# Patient Record
Sex: Male | Born: 1975 | Race: White | Hispanic: No | Marital: Married | Smoking: Never smoker
Health system: Southern US, Community
[De-identification: ages and names within clinical notes are randomized; demographics above are authoritative.]

## PROBLEM LIST (undated history)

## (undated) DIAGNOSIS — R112 Nausea with vomiting, unspecified: Secondary | ICD-10-CM

## (undated) DIAGNOSIS — K5790 Diverticulosis of intestine, part unspecified, without perforation or abscess without bleeding: Secondary | ICD-10-CM

## (undated) DIAGNOSIS — F419 Anxiety disorder, unspecified: Secondary | ICD-10-CM

## (undated) DIAGNOSIS — G039 Meningitis, unspecified: Secondary | ICD-10-CM

## (undated) DIAGNOSIS — Z9889 Other specified postprocedural states: Secondary | ICD-10-CM

## (undated) HISTORY — PX: APPENDECTOMY: SHX54

## (undated) HISTORY — PX: KNEE ARTHROSCOPY: SUR90

---

## 2003-09-25 ENCOUNTER — Ambulatory Visit (HOSPITAL_COMMUNITY): Admission: RE | Admit: 2003-09-25 | Discharge: 2003-09-25 | Payer: Self-pay | Admitting: Orthopedic Surgery

## 2003-10-18 ENCOUNTER — Observation Stay (HOSPITAL_COMMUNITY): Admission: RE | Admit: 2003-10-18 | Discharge: 2003-10-19 | Payer: Self-pay | Admitting: Orthopedic Surgery

## 2003-10-28 ENCOUNTER — Encounter (HOSPITAL_COMMUNITY): Admission: RE | Admit: 2003-10-28 | Discharge: 2003-11-27 | Payer: Self-pay | Admitting: Orthopedic Surgery

## 2003-11-02 HISTORY — PX: APPENDECTOMY: SHX54

## 2003-11-02 HISTORY — PX: KNEE ARTHROSCOPY: SUR90

## 2003-11-04 ENCOUNTER — Encounter (HOSPITAL_COMMUNITY): Admission: RE | Admit: 2003-11-04 | Discharge: 2003-12-04 | Payer: Self-pay | Admitting: Orthopedic Surgery

## 2003-11-28 ENCOUNTER — Inpatient Hospital Stay (HOSPITAL_COMMUNITY): Admission: EM | Admit: 2003-11-28 | Discharge: 2003-11-29 | Payer: Self-pay | Admitting: Emergency Medicine

## 2003-12-06 ENCOUNTER — Encounter (HOSPITAL_COMMUNITY): Admission: RE | Admit: 2003-12-06 | Discharge: 2004-01-05 | Payer: Self-pay | Admitting: Orthopedic Surgery

## 2003-12-24 ENCOUNTER — Ambulatory Visit (HOSPITAL_COMMUNITY): Admission: RE | Admit: 2003-12-24 | Discharge: 2003-12-24 | Payer: Self-pay | Admitting: Orthopedic Surgery

## 2004-01-06 ENCOUNTER — Encounter (HOSPITAL_COMMUNITY): Admission: RE | Admit: 2004-01-06 | Discharge: 2004-02-05 | Payer: Self-pay | Admitting: Orthopedic Surgery

## 2004-01-10 ENCOUNTER — Inpatient Hospital Stay (HOSPITAL_COMMUNITY): Admission: AD | Admit: 2004-01-10 | Discharge: 2004-01-15 | Payer: Self-pay | Admitting: Family Medicine

## 2004-02-05 ENCOUNTER — Encounter (HOSPITAL_COMMUNITY): Admission: RE | Admit: 2004-02-05 | Discharge: 2004-03-06 | Payer: Self-pay | Admitting: Orthopedic Surgery

## 2010-03-26 ENCOUNTER — Emergency Department (HOSPITAL_COMMUNITY): Admission: EM | Admit: 2010-03-26 | Discharge: 2010-03-27 | Payer: Self-pay | Admitting: Emergency Medicine

## 2010-03-27 ENCOUNTER — Inpatient Hospital Stay (HOSPITAL_COMMUNITY): Admission: EM | Admit: 2010-03-27 | Discharge: 2010-03-28 | Payer: Self-pay | Admitting: Emergency Medicine

## 2010-03-29 ENCOUNTER — Inpatient Hospital Stay (HOSPITAL_COMMUNITY)
Admission: EM | Admit: 2010-03-29 | Discharge: 2010-04-03 | Payer: Self-pay | Source: Home / Self Care | Admitting: Emergency Medicine

## 2010-04-02 ENCOUNTER — Ambulatory Visit: Payer: Self-pay | Admitting: Internal Medicine

## 2010-07-14 ENCOUNTER — Emergency Department (HOSPITAL_COMMUNITY): Admission: EM | Admit: 2010-07-14 | Discharge: 2010-07-14 | Payer: Self-pay | Admitting: Emergency Medicine

## 2010-10-08 ENCOUNTER — Emergency Department (HOSPITAL_COMMUNITY)
Admission: EM | Admit: 2010-10-08 | Discharge: 2010-10-08 | Payer: Self-pay | Source: Home / Self Care | Admitting: Emergency Medicine

## 2011-01-11 LAB — URINALYSIS, ROUTINE W REFLEX MICROSCOPIC
Bilirubin Urine: NEGATIVE
Glucose, UA: NEGATIVE mg/dL
Ketones, ur: 15 mg/dL — AB
Leukocytes, UA: NEGATIVE
Nitrite: NEGATIVE
Protein, ur: 30 mg/dL — AB
Specific Gravity, Urine: 1.025 (ref 1.005–1.030)
Urobilinogen, UA: 1 mg/dL (ref 0.0–1.0)
pH: 6 (ref 5.0–8.0)

## 2011-01-11 LAB — DIFFERENTIAL
Basophils Absolute: 0 10*3/uL (ref 0.0–0.1)
Basophils Relative: 0 % (ref 0–1)
Eosinophils Absolute: 0 10*3/uL (ref 0.0–0.7)
Eosinophils Relative: 0 % (ref 0–5)
Lymphocytes Relative: 11 % — ABNORMAL LOW (ref 12–46)
Lymphs Abs: 1.7 10*3/uL (ref 0.7–4.0)
Monocytes Absolute: 1.2 10*3/uL — ABNORMAL HIGH (ref 0.1–1.0)
Monocytes Relative: 8 % (ref 3–12)
Neutro Abs: 12.7 10*3/uL — ABNORMAL HIGH (ref 1.7–7.7)
Neutrophils Relative %: 81 % — ABNORMAL HIGH (ref 43–77)

## 2011-01-11 LAB — COMPREHENSIVE METABOLIC PANEL
ALT: 76 U/L — ABNORMAL HIGH (ref 0–53)
AST: 42 U/L — ABNORMAL HIGH (ref 0–37)
Albumin: 3.9 g/dL (ref 3.5–5.2)
Alkaline Phosphatase: 109 U/L (ref 39–117)
BUN: 12 mg/dL (ref 6–23)
CO2: 27 mEq/L (ref 19–32)
Calcium: 9.3 mg/dL (ref 8.4–10.5)
Chloride: 101 mEq/L (ref 96–112)
Creatinine, Ser: 0.98 mg/dL (ref 0.4–1.5)
GFR calc Af Amer: >60 mL/min (ref >60)
GFR calc non Af Amer: >60 mL/min (ref >60)
Glucose, Bld: 116 mg/dL — ABNORMAL HIGH (ref 70–99)
Potassium: 3.9 mEq/L (ref 3.5–5.1)
Sodium: 135 mEq/L (ref 135–145)
Total Bilirubin: 0.7 mg/dL (ref 0.3–1.2)
Total Protein: 7.5 g/dL (ref 6.0–8.3)

## 2011-01-11 LAB — CBC
HCT: 36.7 % — ABNORMAL LOW (ref 39.0–52.0)
Hemoglobin: 12.8 g/dL — ABNORMAL LOW (ref 13.0–17.0)
MCH: 29.3 pg (ref 26.0–34.0)
MCHC: 34.9 g/dL (ref 30.0–36.0)
MCV: 84 fL (ref 78.0–100.0)
Platelets: 361 10*3/uL (ref 150–400)
RBC: 4.37 MIL/uL (ref 4.22–5.81)
RDW: 12.6 % (ref 11.5–15.5)
WBC: 15.6 10*3/uL — ABNORMAL HIGH (ref 4.0–10.5)

## 2011-01-11 LAB — URINE CULTURE
Colony Count: NO GROWTH
Culture  Setup Time: 201112090130
Culture: NO GROWTH

## 2011-01-11 LAB — URINE MICROSCOPIC-ADD ON

## 2011-01-11 LAB — LIPASE, BLOOD: Lipase: 50 U/L (ref 11–59)

## 2011-01-14 LAB — BASIC METABOLIC PANEL
BUN: 12 mg/dL (ref 6–23)
CO2: 26 mEq/L (ref 19–32)
Calcium: 9.4 mg/dL (ref 8.4–10.5)
Chloride: 107 mEq/L (ref 96–112)
Creatinine, Ser: 0.91 mg/dL (ref 0.4–1.5)
GFR calc Af Amer: >60 mL/min (ref >60)
GFR calc non Af Amer: >60 mL/min (ref >60)
Glucose, Bld: 114 mg/dL — ABNORMAL HIGH (ref 70–99)
Potassium: 3.5 mEq/L (ref 3.5–5.1)
Sodium: 139 mEq/L (ref 135–145)

## 2011-01-14 LAB — TSH: TSH: 3.967 u[IU]/mL (ref 0.350–4.500)

## 2011-01-14 LAB — POCT CARDIAC MARKERS
CKMB, poc: <1 ng/mL — ABNORMAL LOW (ref 1.0–8.0)
Myoglobin, poc: 33.3 ng/mL (ref 12–200)
Troponin i, poc: <0.05 ng/mL (ref 0.00–0.09)

## 2011-01-18 LAB — COMPREHENSIVE METABOLIC PANEL
ALT: 13 U/L (ref 0–53)
AST: 12 U/L (ref 0–37)
Alkaline Phosphatase: 39 U/L (ref 39–117)
CO2: 25 mEq/L (ref 19–32)
Calcium: 8.9 mg/dL (ref 8.4–10.5)
GFR calc Af Amer: >60 mL/min (ref >60)
GFR calc non Af Amer: >60 mL/min (ref >60)
Glucose, Bld: 110 mg/dL — ABNORMAL HIGH (ref 70–99)
Potassium: 3.6 mEq/L (ref 3.5–5.1)
Sodium: 137 mEq/L (ref 135–145)

## 2011-01-18 LAB — CARDIAC PANEL(CRET KIN+CKTOT+MB+TROPI)
CK, MB: 0.3 ng/mL (ref 0.3–4.0)
Relative Index: INVALID (ref 0.0–2.5)
Total CK: 32 U/L (ref 7–232)
Troponin I: 0.01 ng/mL (ref 0.00–0.06)

## 2011-01-18 LAB — BASIC METABOLIC PANEL
BUN: 11 mg/dL (ref 6–23)
BUN: 12 mg/dL (ref 6–23)
BUN: 13 mg/dL (ref 6–23)
BUN: 14 mg/dL (ref 6–23)
CO2: 23 mEq/L (ref 19–32)
CO2: 24 mEq/L (ref 19–32)
CO2: 24 mEq/L (ref 19–32)
CO2: 27 mEq/L (ref 19–32)
CO2: 27 mEq/L (ref 19–32)
Calcium: 9.1 mg/dL (ref 8.4–10.5)
Calcium: 9.3 mg/dL (ref 8.4–10.5)
Calcium: 8.6 mg/dL (ref 8.4–10.5)
Calcium: 8.9 mg/dL (ref 8.4–10.5)
Chloride: 102 mEq/L (ref 96–112)
Chloride: 104 mEq/L (ref 96–112)
Chloride: 102 mEq/L (ref 96–112)
Chloride: 105 mEq/L (ref 96–112)
Creatinine, Ser: 0.84 mg/dL (ref 0.4–1.5)
Creatinine, Ser: 0.86 mg/dL (ref 0.4–1.5)
Creatinine, Ser: 0.98 mg/dL (ref 0.4–1.5)
GFR calc Af Amer: >60 mL/min (ref >60)
GFR calc Af Amer: >60 mL/min (ref >60)
GFR calc Af Amer: >60 mL/min (ref >60)
GFR calc Af Amer: >60 mL/min (ref >60)
GFR calc non Af Amer: >60 mL/min (ref >60)
GFR calc non Af Amer: >60 mL/min (ref >60)
GFR calc non Af Amer: >60 mL/min (ref >60)
Glucose, Bld: 92 mg/dL (ref 70–99)
Glucose, Bld: 96 mg/dL (ref 70–99)
Glucose, Bld: 104 mg/dL — ABNORMAL HIGH (ref 70–99)
Glucose, Bld: 114 mg/dL — ABNORMAL HIGH (ref 70–99)
Glucose, Bld: 98 mg/dL (ref 70–99)
Potassium: 3.4 mEq/L — ABNORMAL LOW (ref 3.5–5.1)
Potassium: 3.6 mEq/L (ref 3.5–5.1)
Potassium: 3.4 mEq/L — ABNORMAL LOW (ref 3.5–5.1)
Potassium: 3.6 mEq/L (ref 3.5–5.1)
Potassium: 4 mEq/L (ref 3.5–5.1)
Sodium: 134 mEq/L — ABNORMAL LOW (ref 135–145)
Sodium: 136 mEq/L (ref 135–145)
Sodium: 135 mEq/L (ref 135–145)

## 2011-01-18 LAB — GRAM STAIN

## 2011-01-18 LAB — CSF CULTURE W GRAM STAIN: Culture: NO GROWTH

## 2011-01-18 LAB — DIFFERENTIAL
Basophils Absolute: 0.1 10*3/uL (ref 0.0–0.1)
Basophils Absolute: 0.1 10*3/uL (ref 0.0–0.1)
Basophils Relative: 1 % (ref 0–1)
Basophils Relative: 0 % (ref 0–1)
Basophils Relative: 1 % (ref 0–1)
Eosinophils Absolute: 0 10*3/uL (ref 0.0–0.7)
Eosinophils Absolute: 0 10*3/uL (ref 0.0–0.7)
Eosinophils Absolute: 0 10*3/uL (ref 0.0–0.7)
Eosinophils Absolute: 0 10*3/uL (ref 0.0–0.7)
Eosinophils Relative: 1 % (ref 0–5)
Eosinophils Relative: 0 % (ref 0–5)
Eosinophils Relative: 0 % (ref 0–5)
Eosinophils Relative: 0 % (ref 0–5)
Lymphocytes Relative: 20 % (ref 12–46)
Lymphocytes Relative: 13 % (ref 12–46)
Lymphocytes Relative: 26 % (ref 12–46)
Lymphs Abs: 1.5 10*3/uL (ref 0.7–4.0)
Lymphs Abs: 1 10*3/uL (ref 0.7–4.0)
Lymphs Abs: 1.6 10*3/uL (ref 0.7–4.0)
Lymphs Abs: 1.9 10*3/uL (ref 0.7–4.0)
Monocytes Absolute: 0.7 10*3/uL (ref 0.1–1.0)
Monocytes Absolute: 0.4 10*3/uL (ref 0.1–1.0)
Monocytes Absolute: 0.6 10*3/uL (ref 0.1–1.0)
Monocytes Absolute: 0.9 10*3/uL (ref 0.1–1.0)
Monocytes Relative: 9 % (ref 3–12)
Monocytes Relative: 10 % (ref 3–12)
Monocytes Relative: 10 % (ref 3–12)
Monocytes Relative: 6 % (ref 3–12)
Neutro Abs: 5.4 10*3/uL (ref 1.7–7.7)
Neutro Abs: 3.9 10*3/uL (ref 1.7–7.7)
Neutro Abs: 5.9 10*3/uL (ref 1.7–7.7)
Neutrophils Relative %: 70 % (ref 43–77)

## 2011-01-18 LAB — CSF CELL COUNT WITH DIFFERENTIAL
Eosinophils, CSF: 0 % (ref 0–1)
Eosinophils, CSF: 0 % (ref 0–1)
Monocyte-Macrophage-Spinal Fluid: 0 % — ABNORMAL LOW (ref 15–45)
Monocyte-Macrophage-Spinal Fluid: 0 % — ABNORMAL LOW (ref 15–45)
Other Cells, CSF: 0
Tube #: 1
Tube #: 3
WBC, CSF: 700 /mm3 (ref 0–5)

## 2011-01-18 LAB — HERPES SIMPLEX VIRUS(HSV) DNA BY PCR: HSV 1 DNA: NOT DETECTED

## 2011-01-18 LAB — HEPATIC FUNCTION PANEL
ALT: 14 U/L (ref 0–53)
AST: 13 U/L (ref 0–37)
Albumin: 4.2 g/dL (ref 3.5–5.2)
Alkaline Phosphatase: 45 U/L (ref 39–117)
Total Protein: 6.6 g/dL (ref 6.0–8.3)

## 2011-01-18 LAB — CBC
HCT: 38.3 % — ABNORMAL LOW (ref 39.0–52.0)
HCT: 33.3 % — ABNORMAL LOW (ref 39.0–52.0)
HCT: 35.8 % — ABNORMAL LOW (ref 39.0–52.0)
HCT: 39.7 % (ref 39.0–52.0)
HCT: 40.3 % (ref 39.0–52.0)
Hemoglobin: 13.3 g/dL (ref 13.0–17.0)
Hemoglobin: 11.5 g/dL — ABNORMAL LOW (ref 13.0–17.0)
Hemoglobin: 12.4 g/dL — ABNORMAL LOW (ref 13.0–17.0)
Hemoglobin: 12.4 g/dL — ABNORMAL LOW (ref 13.0–17.0)
Hemoglobin: 13.8 g/dL (ref 13.0–17.0)
MCHC: 34.8 g/dL (ref 30.0–36.0)
MCHC: 34.1 g/dL (ref 30.0–36.0)
MCHC: 34.4 g/dL (ref 30.0–36.0)
MCHC: 34.5 g/dL (ref 30.0–36.0)
MCV: 84.7 fL (ref 78.0–100.0)
MCV: 85.7 fL (ref 78.0–100.0)
MCV: 86.2 fL (ref 78.0–100.0)
Platelets: 198 10*3/uL (ref 150–400)
Platelets: 210 10*3/uL (ref 150–400)
Platelets: 211 10*3/uL (ref 150–400)
RBC: 4.52 MIL/uL (ref 4.22–5.81)
RBC: 3.89 MIL/uL — ABNORMAL LOW (ref 4.22–5.81)
RBC: 4.16 MIL/uL — ABNORMAL LOW (ref 4.22–5.81)
RBC: 4.25 MIL/uL (ref 4.22–5.81)
RDW: 12.8 % (ref 11.5–15.5)
RDW: 12.8 % (ref 11.5–15.5)
WBC: 7.7 10*3/uL (ref 4.0–10.5)
WBC: 7.1 10*3/uL (ref 4.0–10.5)
WBC: 7.7 10*3/uL (ref 4.0–10.5)

## 2011-01-18 LAB — URINALYSIS, ROUTINE W REFLEX MICROSCOPIC
Glucose, UA: NEGATIVE mg/dL
Ketones, ur: >80 mg/dL — AB
Leukocytes, UA: NEGATIVE
Nitrite: NEGATIVE
Protein, ur: 30 mg/dL — AB

## 2011-01-18 LAB — MAGNESIUM: Magnesium: 2.3 mg/dL (ref 1.5–2.5)

## 2011-01-18 LAB — PROTEIN AND GLUCOSE, CSF
Glucose, CSF: 53 mg/dL (ref 43–76)
Total  Protein, CSF: 120 mg/dL — ABNORMAL HIGH (ref 15–45)

## 2011-01-18 LAB — AMYLASE: Amylase: 39 U/L (ref 0–105)

## 2011-01-18 LAB — URINE MICROSCOPIC-ADD ON

## 2011-01-18 LAB — VDRL, CSF: VDRL Quant, CSF: NONREACTIVE

## 2011-03-19 NOTE — Op Note (Signed)
NAME:  Carlos Stone, Carlos Stone                         ACCOUNT NO.:  [id number]   MEDICAL RECORD NO.:  [id number]                   PATIENT TYPE:  AMB   LOCATION:  DAY                                  FACILITY:  [facility]   PHYSICIAN:  Vickki Hearing, M.D.           DATE OF BIRTH:  01-09-1976   DATE OF PROCEDURE:  12/24/2003  DATE OF DISCHARGE:                                 OPERATIVE REPORT   HISTORY AND INDICATIONS:  Carlos Stone is 35 years old.  He is status post ACL  reconstruction with allograft and an autograft, osteochondral procedure in  December 2004.  Developed arthrofibrosis.  After eight weeks of no progress  in therapy, and a range of motion of 5 to 80 and scarring of the patella, he  presented for arthroscopy, examination under anesthesia, manipulation under  anesthesia of his right knee.   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   OPERATION/PROCEDURE:  Arthroscopy with extensive debridement and  manipulation under anesthesia as well as examination under anesthesia.   SURGEON:  Vickki Hearing, M.D.   ANESTHESIA:  General.   FINDINGS:  Preoperative range of motion under anesthesia 5-80 degrees,  postoperative 0-125.  Intraoperative graft was noted to be intact by  visualization and probing.  His Lachman's was grade 0.  There was extensive  scarring and fibrotic tissue in the suprapatellar pouch and medial gutter.  There was scarring of the fat pad.   DESCRIPTION OF PROCEDURE:  Carlos Stone was identified in the holding area,  marked his knee on the right with an X.  I signed my initials there,  reviewed his medical records and noted that the chart history and consent  both confirmed right knee was for surgery with arthroscopy.  He was given  Ancef.  He was taken to the operating room where  general anesthetic was  administered.  He has had a sterile prep and drape of the right knee.  At  that point we took a time out, confirmed the patient's identify, his consent  and procedure and extremity.  Everyone agreed that he was to have  arthroscopy of the right knee, manipulation and examination under anesthesia  for arthrofibrosis.   A standard three-incision arthroscopy was established with the suprapatellar  cannula.  We manually reestablished the suprapatellar pouch.  We did the  same thing in the medial and lateral gutters, inserted the scope into the  suprapatellar pouch and did a debridement with a combination of an  ArthroCare wand and a full radius shaver blade.  We proceeded down the  medial gutter, anterior fat pad and the lateral side which seemed to be  clean.  There was no impingement in the notch.  The graft was intact.  The  osteochondral graft was also probed and found to  be intact.  After we  finished our debridement, we did a manual manipulation until his knee  reached 125 degrees and full extension.  We then put the scope back in the  knee, rechecked the graft, found it to be intact, took care of any bleeding,  then irrigated the knee, suctioned it free, and closed with 3-0 nylon  sutures, injected 30 mL of 0.5% Sensorcaine plain, covered the knee with  sterile dressings and a cryopad.  He was then extubated and taken to the  recovery room in stable condition where he was placed on a CPM machine.   POSTOPERATIVE PLAN:  Immediate physical therapy starting tomorrow, continued  daily for the first two weeks, and then we will try to get a CPM for rental  for him as well.  Followup in two days.      ___________________________________________                                            Vickki Hearing, M.D.   SEH/MEDQ  D:  12/24/2003  T:  12/24/2003  Job:  [id number]

## 2011-03-19 NOTE — Op Note (Signed)
NAME:  Carlos Stone, Carlos Stone                         ACCOUNT NO.:  [id number]   MEDICAL RECORD NO.:  [id number]                   PATIENT TYPE:  AMB   LOCATION:  DAY                                  FACILITY:  [facility]   PHYSICIAN:  Vickki Hearing, M.D.           DATE OF BIRTH:  1976-07-02   DATE OF PROCEDURE:  09/25/2003  DATE OF DISCHARGE:                                 OPERATIVE REPORT   PREOPERATIVE DIAGNOSIS:  Loose body right knee.   POSTOPERATIVE DIAGNOSES:  1. Partial anterior cruciate ligament (ACL) tear right knee.  2. Osteochondritis dissecans (OCD) medial femoral condyle.  3. Possible/questionable old lateral meniscal tear.   PROCEDURE:  1. Arthroscopic drilling medial femoral condyle.  2. Examination under anesthesia.   SURGEON:  Vickki Hearing, M.D.   ANESTHETIC:  LMA general.   FINDINGS:  OCD medial femoral condyle, lateral portion.  There was no loose  body found.  There was a partial tear of the ACL.  There was a questionable  old lateral meniscal tear with scarring of the peripheral attachment side  of the posterior horn.  Under anesthesia there was a grade 1 Lachman and a  glide on pivot.   HISTORY:  The patient is 35 years old.  He had an injury in [employer],  mechanism is unknown at this time (the exact details).  He did have to  undergo therapy then had a loose body removed approximately 14 years ago.  Presented, at this time, with a locked knee with an MRI showing the  possibility of a loose body.  Reviewing the MRI the ligaments were thought to be intact.   DETAILS OF PROCEDURE:  The patient was identified as Carlos Stone.  His  right knee was marked as the operative site.  Consent and chart agreed with  his marking of the right knee as the surgical site.  He was given  preoperative antibiotics, and taken to the operating room where he had an  LMA anesthetic.   We did a time out, confirmed the procedure, physical therapy and limb; and  proceed  prep and drape the right lower extremity. We did a 3-incision,  arthroscopic diagnostic procedure.   The findings I have listed above.   The lesion was debrided of free soft tissue.  The underlying bed was  sclerotic bone.  Chondral pick was used to make several holes in the  subchondral plate.   The knee was inspected in the posteromedial and posterolateral portions as  well as the gutters and suprapatellar pouch for loose body.  There was none  found.   We washed the knee out, suctioned it dry, and then did a examination under  anesthesia with the lateral post removed; found out that he had a grade 1  Lachman under anesthesia and a pivot glide on the pivot shift test.   We then dressed the knee with sterile dressings, cryo pad and  ACE bandage.  We extubated him and took him to the recovery room in stable condition.   POSTOPERATIVE PLAN:  The patient will require further surgery.  He will  require allograft or autograft chondral grafting of the medial femoral  condyle, ACL reconstruction and probably open repair of his lateral  meniscus.      ___________________________________________                                            Vickki Hearing, M.D.   SEH/MEDQ  D:  09/25/2003  T:  09/25/2003  Job:  [id number]

## 2011-03-19 NOTE — Op Note (Signed)
NAME:  Carlos Stone, Carlos Stone                         ACCOUNT NO.:  [id number]   MEDICAL RECORD NO.:  [id number]                   PATIENT TYPE:  OBV   LOCATION:  A313                                 FACILITY:  [facility]   PHYSICIAN:  Barbaraann Barthel, M.D.              DATE OF BIRTH:  03-20-1976   DATE OF PROCEDURE:  11/27/2003  DATE OF DISCHARGE:                                 OPERATIVE REPORT   PREOPERATIVE DIAGNOSIS:  Acute appendicitis.   POSTOPERATIVE DIAGNOSIS:  Acute appendicitis.   SURGEON:  Barbaraann Barthel, M.D.   PROCEDURE:  Open appendectomy.   SPECIMENS:  Appendix.   Note, this is a 35 year old white male who had a 36 hour history of right  lower quadrant pain, nausea and anorexia.  He came to the emergency room  with a positive CT scan and we plan for surgery after hydration and  antibiotic therapy was initiated.  We discussed complications not limited to  but including bleeding, infection, and appendiceal stump leak.  Informed  consent was obtained.   GROSS FINDINGS:  Those consistent with an acute __________ nonperforated  appendicitis. The appendix was located retrocecally.   TECHNIQUE:  The patient was placed in supine position and after the adequate  administration of general anesthesia via endotracheal intubation, his entire  abdomen was prepped with Betadine solution and draped in the usual manner.  Prior to this, a Foley catheter was inserted.  A transverse incision was  made in the right lower quadrant through skin and subcutaneous tissue,  through the fascia and through the peritoneum retracting the rectus  medially.  The cecum was delivered into the wound, the appendix was found to  be very retrocecal. We amputated it at its insertion site at the cecum with  the endoscopic GIA device. We then ligated the mesoappendix with 2-0 silk  and then carefully dissected down freeing up the cecum from the right  peritoneal wall in order to deliver the appendix.  After  ligating the  mesoappendix, we then changed gloves, irrigated the right lower quadrant and  then closed the abdomen according to layers using #0 Polysorb to close the  peritoneum and figure-of-eight interrupted Polysorb's for the fascia.  The  subcu was irrigated and the skin was approximated with a stapling device.  Prior to closure, all sponge, needle and instrument counts were found to be  correct. Estimated blood loss was minimal. The patient received a liter of  crystalloid intraoperatively. There was no drains placed, there were no  complications.      ___________________________________________                                            Barbaraann Barthel, M.D.   WB/MEDQ  D:  11/27/2003  T:  11/27/2003  Job:  [id number]

## 2011-03-19 NOTE — Discharge Summary (Signed)
NAME:  Carlos Stone, Carlos Stone                         ACCOUNT NO.:  [id number]   MEDICAL RECORD NO.:  [id number]                   PATIENT TYPE:  INP   LOCATION:  A312                                 FACILITY:  [facility]   PHYSICIAN:  Patrica Duel, M.D.                 DATE OF BIRTH:  01-May-1976   DATE OF ADMISSION:  01/09/2004  DATE OF DISCHARGE:  01/15/2004                                 DISCHARGE SUMMARY   DISCHARGE DIAGNOSES:  1. Recurrent/ refractory Clostridium difficile colitis.  2. Status post recent arthroscopic knee procedure.  3. Status post appendectomy in February of this year.   For details regarding admission, please refer to the admission note.   HISTORY OF PRESENT ILLNESS:  This is a 35 year old male with a history of  Clostridium difficile colitis who appropriately one month ago was treated  with Flagyl successfully.  The patient had recently undergone emergency  appendectomy for acute appendicitis.  He had been treated with Rocephin  apparently in the perioperative period.  He responded promptly and  completely to Flagyl as prescribed.   The patient underwent arthroscopic knee procedure on December 24, 2003.  He  soon developed diarrhea, and took another course of Flagyl with resolution.  He presented to the office on the day of admission with a 12-hour history of  increasingly severe nausea, vomiting and diarrhea.  He had a temperature of  100 degrees in the office.  He was given an empiric course of Flagyl and  appropriate diagnostics were obtained.   Upon arriving home, his mother felt he was too sick, and he was admitted for  hydration and further evaluation and therapy as indicated.   HOSPITAL COURSE:  The patient's stool tested positive for Clostridium  difficile toxin.  His antibiotic regimen was changed to vancomycin.  GI was  consulted who agreed with our management.  Lactinex was added.  The  patient's clinical status and laboratory parameters improved  gradually over  the course of his hospitalization.  He had continuing diarrhea and a  fleeting rash which promptly resolved. As of yesterday, his diarrhea has  resolved.  He is ambulating well, taking a good diet, and has had no fever  or other problems.  He is stable for discharge at this time.   DISPOSITION:  As per GI.   DISCHARGE MEDICATIONS:  1. Vancomycin 250 mg per 5 cc, 2.5 cc q.i.d. x7 days, and then 2.5 cc b.i.d.     x7 days.  2. He will continue Lactinex and p.r.n. Lomotil and use dietary discretion.  3. He will be followed expectantly as an outpatient.    ___________________________________________                                         Patrica Duel, M.D.   MC/MEDQ  D:  01/15/2004  T:  01/16/2004  Job:  [id number]

## 2011-03-19 NOTE — Group Therapy Note (Signed)
NAME:  Carlos Stone, Carlos Stone                         ACCOUNT NO.:  [id number]   MEDICAL RECORD NO.:  [id number]                   PATIENT TYPE:  AMB   LOCATION:  DAY                                  FACILITY:  [facility]   PHYSICIAN:  Vickki Hearing, M.D.           DATE OF BIRTH:  1976-07-28   DATE OF PROCEDURE:  DATE OF DISCHARGE:                                   PROGRESS NOTE   ADDENDUM:  The patient was scheduled to go home.  He is too nauseous and did  not respond well to the Zofran and will, therefore, be admitted.  We will  continue monitoring him, give him fluid and more Zofran to see if this will  control his nausea, and then he can go home tomorrow morning.      ___________________________________________                                            Vickki Hearing, M.D.   SEH/MEDQ  D:  10/18/2003  T:  10/18/2003  Job:  [id number]

## 2011-03-19 NOTE — H&P (Signed)
NAME:  Carlos Stone, Carlos Stone                         ACCOUNT NO.:  [id number]   MEDICAL RECORD NO.:  [id number]                   PATIENT TYPE:  AMB   LOCATION:  DAY                                  FACILITY:  [facility]   PHYSICIAN:  Vickki Hearing, M.D.           DATE OF BIRTH:  1975/11/06   DATE OF ADMISSION:  DATE OF DISCHARGE:                                HISTORY & PHYSICAL   CHIEF COMPLAINT:  Stiff right knee.   HISTORY:  A 35 year old male status post ACL reconstruction with allograft  and an autograft osteochondral graft who had surgery in December 2004, did  not progress well in rehab. We gave him several rehabilitation treatments,  did not progress in terms of range of motion, and now presents for  manipulation under anesthesia and debridement of scar tissue.   He has recently had an appendectomy. He is allergic to PENICILLIN. No other  medical problems. In 1990, he had knee surgery. In 1995, he had a knee  surgery ago. Had a tonsillectomy in 1987. He had an arthroscopy of his knee  which I did in November of 2004 at which time I found his deficit ACL and  his osteochondral injury.   MEDICATIONS:  Currently on Lorcet Plus.   FAMILY HISTORY:  Negative.   PHYSICIANS:  Dr. Nobie Putnam and Dr. Phillips Odor.   SOCIAL HISTORY:  He is married. He works in a Doctor, hospital. He does not  smoke or drink. He completed his education through the 12th grade.   PHYSICAL EXAMINATION:  GENERAL:  He is well developed, well nourished;  grooming and hygiene are normal. No deformity except for the right knee. He  has medium frame, medium height. His pulse is 74. His respiratory rate is  20.  CARDIOVASCULAR:  Showed normal pulse and perfusion. No edema, tenderness, or  swelling.  LYMPHATIC SYSTEM:  Has no cervical adenopathy.  SKIN:  His skin is normal. He has incisions on the medial and lateral  aspects of his knee from his ACL reconstruction.  NEUROLOGICAL:  His neurological exam shows normal  sensation and reflexes. He  is alert and oriented x3. His mood and affect is normal.  EXTREMITIES:  His right knee has range of motion of about 10 to 85. I can  press him to about 90. He does not go beyond that. His graft appears stable.  He has medial and lateral patellar contracture with decreased mobility.   IMPRESSION:  Arthrofibrosis, right knee, status post ACL reconstruction, and  autograft chondral graft.   PLAN:  Arthroscopic evaluation, exam and manipulation under anesthesia of  the right knee.     ___________________________________________                                         Vickki Hearing, M.D.   SEH/MEDQ  D:  12/23/2003  T:  12/23/2003  Job:  [id number]

## 2011-03-19 NOTE — Op Note (Signed)
NAME:  MAIKA, KACZMAREK                         ACCOUNT NO.:  [id number]   MEDICAL RECORD NO.:  [id number]                   PATIENT TYPE:  OBV   LOCATION:  A307                                 FACILITY:  [facility]   PHYSICIAN:  Vickki Hearing, M.D.           DATE OF BIRTH:  01-25-1976   DATE OF PROCEDURE:  DATE OF DISCHARGE:                                 OPERATIVE REPORT   PREOPERATIVE DIAGNOSES:  Anterior cruciate ligament tear right knee, OC&D  lesion medial femoral condyle, lateral meniscal tear all right knee.   POSTOPERATIVE DIAGNOSES:  Anterior cruciate ligament tear right knee, OC&D  lesion medial femoral condyle, lateral meniscal tear all right knee, loose  body.   PROCEDURE:  Allograft ACL reconstruction using a frozen Achilles tendon  allograft, autograft lateral trochlea to medial femoral condyle using an  OATS system, removal of loose body and repair of lateral meniscal tear using  meniscal arrow.   SURGEON:  Vickki Hearing, M.D.   ANESTHESIA:  Spinal.   FINDINGS:  The ACL was torn as noted on previous arthroscopic evaluation.  The OCD lesion of the lateral portion of the medial femoral condyle had  scarred in with fibrous and hemorrhagic clot.  There was a loose body behind  the capsule once the ACL was taken down and the over the top position was  identified. There was an old lateral meniscal tear with capsular stretching.   DESCRIPTION OF PROCEDURE:  Mr. Coll was identified by armband in the  holding area.  After reviewing his medical record and his consent and the  mark he had placed over his right knee, I placed my initials over the right  knee.  He was given Ancef and taken to the operating suite where he was  given general anesthesia.  At that time, he had an exam under anesthesia and  we took a time out.  We confirmed that this was indeed Carlos Stone, that  his right knee was the operative site and that he was to have an ACL  allograft and OCD  autograft and arthroscopic open combination procedure of  the right knee.   Sterile prep and drape was performed, exam under anesthesia was performed.  Finding loose knee with positive Lachman and pivot shift test.   We first prepared the graft on the back table preparing a 10 mm calcaneal  bone plug for the femoral side of the graft.  We placed in on a tensioner,  wrapped it in saline and proceeded with the arthroscopic portion of the  procedure.   A three incision arthroscopic technique was utilized. Diagnostic arthroscopy  was performed. The lateral meniscus was found to be intact at its periphery  with stretching of the capsulomeniscal junction.  We placed two meniscal  arrows in this meniscus and that stabilized it very well.   We then debrided the old ACL stump, found the over the top position, drilled  the tibial pin in the ACL foot print and passed an over the top 7 mm guide,  passed a guidewire across the joint into the femur and out the anterolateral  femur.  I took an 11 mm reamer for the tibial tunnel and a 10 mm reamer for  the femoral tunnel, cleaned the debris out from the joint, rasped the  posterior tibial tunnel and then pulled the graft through.  I made a notch  over the anterolateral portion of the femoral bone plug, passed a dilator  guide pin and a 7 x 23 mm bioscrew.  I then pulled on the distal portion of  the graft to ensure fixation.  I was satisfied and proceeded to the grafting  portion for the OCD lesion.   I first debrided the old lesion, debrided the rim of the lesion removing the  clot and fibrous scar.  I made a lateral incision over the lateral trochlea  and femoral condyle, went through the capsule and took a  15 mm bone plug using the OATS harvester.  We removed this and then did a  medial arthrotomy, prepared the donor site and then placed the graft in  place.  I took the donor site and packed it in the harvest site over the  lateral trochlea.    Irrigated the knee out with copious amounts of saline, closed the capsule  with Bralon, subcu with 0 and 2-0 Vicryl, closed the skin with skin staples.  I twisted the graft 90 degrees laterally, viewed it arthroscopically  including the bone graft. The ACL graft looked good and then the bone graft  plug osteochondral graft looked good.   In extension, I passed an 11 x 35 mm screw, checked the intraarticular view  to make sure the screw is not in the joint, it was not and then checked  Lachman test and the knee was solid as a rock.  We injected 30 mL of  Sensorcaine.   We put sterile dressings in a CryoCuff along with Ace bandage on the knee,  placed them in a knee immobilizer.   He was extubated and taken to the recovery room in stable condition.   He can go home today.  Followup Monday.      ___________________________________________                                            Vickki Hearing, M.D.   SEH/MEDQ  D:  10/18/2003  T:  10/19/2003  Job:  [id number]

## 2011-03-19 NOTE — Consult Note (Signed)
NAME:  Carlos Stone, [name]                         ACCOUNT NO.:  [id number]   MEDICAL RECORD NO.:  [id number]                   PATIENT TYPE:  OBV   LOCATION:  A313                                 FACILITY:  [facility]   PHYSICIAN:  Barbaraann Barthel, M.D.              DATE OF BIRTH:  08/26/1976   DATE OF CONSULTATION:  11/27/2003  DATE OF DISCHARGE:                                   CONSULTATION   ADMITTING HISTORY AND PHYSICAL AND EMERGENCY ROOM NOTE   NOTE:  Surgery was asked to see this 35 year old white male who came into  the emergency room with an approximately 36-hour history of right lower  quadrant pain accompanied with nausea and anorexia.  He has not eaten since  6 o'clock yesterday evening and he was seen in the emergency room where CT  scan revealed acute retrocecal appendicitis.  Surgery was consulted and  responded immediately.   PHYSICAL EXAMINATION:  GENERAL:  Physical examination discloses a pleasant,  35 year old white male in no acute distress, but uncomfortable.  He is 175  pounds. He is 5 feet 9 inches.  His temperature is 99.3, his pulse is 74,  respirations 20/min and his blood pressure is 122/72.  HEENT:  Head is normocephalic.  Eyes extraocular movements are intact.  Pupils are round and react to light and accommodation.  There is no  conjunctival pallor or scleral injection.  The sclerae are of normal  tincture.  NECK:  There are no bruits auscultated and there is no adenopathy.  CHEST:  Chest is clear both to anterior and posterior auscultation.  HEART:  Regular rhythm.  ABDOMEN:  The patient is tender in the right lower quadrant with localized  rebound tenderness.  He has no femoral or inguinal hernias appreciated.  GENITALIA:  Normal.  RECTAL:  Prostate is smooth and stool is guaiac negative.  EXTREMITIES:  He is status post ACS repair with bone graft on the right knee  done in December 2004.   REVIEW OF SYSTEMS:  GI SYSTEM:  No past history of hepatitis, no  past  history of bright red rectal bleeding, black tarry stools, or change in  bowel habits.  He has nausea and anorexia and right lower quadrant pain.  GU  SYSTEM:  No history of nephrolithiasis or dysuria.  ENDOCRINE SYSTEM:  No  history of diabetes of diabetes or thyroid disease.  CARDIORESPIRATORY  SYSTEM:  Within normal limits. The patient is a nonsmoker and nondrinker.  MUSCULOSKELETAL SYSTEM:  Status post right knee surgery.   MEDICATIONS:  The patient takes ibuprofen p.r.n. and he is allergic to  penicillin.   PAST SURGERY:  Includes a T&A during childhood and right knee surgery in  December 2004.   LABORATORY DATA:  The patient has a white count of 11,000 with an H&H of  13.1 and 38.9 with 85 neutrophils appreciated.  Metabolic 7 was grossly  within normal limits as  is the liver function studies. Urinalysis is grossly  within normal limits.  CT scan revealed retrocecal appendicitis.   IMPRESSION:  1. Acute appendicitis.  2. Status post right knee surgery.   PLAN:  Admit for hydration, antibiotic therapy, and plan for appendectomy as  soon as possible.      ___________________________________________                                            Barbaraann Barthel, M.D.   WB/MEDQ  D:  11/27/2003  T:  11/27/2003  Job:  [id number]   cc:   Barbaraann Barthel, M.D.  [address]  [place]  [place] [address]  Fax: [phone]   Rhae Lerner. Margretta Ditty, M.D.  [address]  [place]  [place] [address]

## 2011-03-19 NOTE — Discharge Summary (Signed)
NAME:  [name], [name]                         ACCOUNT NO.:  [id number]   MEDICAL RECORD NO.:  [id number]                   PATIENT TYPE:  OBV   LOCATION:  A307                                 FACILITY:  [facility]   PHYSICIAN:  Vickki Hearing, M.D.           DATE OF BIRTH:  04-23-1976   DATE OF ADMISSION:  10/18/2003  DATE OF DISCHARGE:                                 DISCHARGE SUMMARY   ADMISSION DIAGNOSES:  1. Right knee anterior cruciate ligament tear.  2. 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  3. Chronic lateral meniscal tear.   POSTOPERATIVE DIAGNOSES:  1. Right knee anterior cruciate ligament tear.  2. 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  3. Right knee lateral meniscal chronic tear.  4. Right knee loose body.   HISTORY:  Carlos Stone is a 35 year old male who had several injuries to his right  knee.  Most recently felt he had a loose body in the knee, and we did an  arthroscopy.  Although we did not find a loose body, we found a large medial  femoral condyle defect and performed a drilling arthroplasty of the defect  along with an exam under anesthesia and diagnostic arthroscopy, which  revealed an ACL tear.  There was also laxity of the capsule, ligamentous  structures of the lateral meniscus.   Once he was in the office we discussed further treatment for his knee and  decided to use an ACL allograft and an autograft osteochondral repair of his  medial femoral condyle defect, re-examining the lateral meniscus repair if  necessary.   The patient was admitted on October 18, 2003, underwent uncomplicated  surgery.  Had an ACL allograft with Achilles tendon.  Had an autograft OCD  of the medial femoral condyle with a bone plug and cartilage attached.  Had  his meniscus repaired with two meniscal arrows, and the loose body was  found, and it was removed.   Postoperatively he became nauseous, did not respond initially to Zofran and  was admitted for observation.   On October 19, 2003, he was awake, alert, and oriented x3.  He seems to be  comfortable.  He is neurovascularly intact.  He has been up and ambulating  to the bathroom and is stable for discharge.   DISCHARGE CONDITION:  Improved.   DISPOSITION:  To home.   INSTRUCTIONS:  CryoCuff eight hours a day.  Nonweightbearing on his right  lower extremity.  Brace and crutches.   He should be on:  1. Norflex 100 mg b.i.d.  2. Ibuprofen 800 mg three times daily.  3. Lorcet Plus 1 q.4h. p.r.n.   His follow-up is scheduled for Monday, October 21, 2003.     ___________________________________________  Vickki Hearing, M.D.   SEH/MEDQ  D:  10/19/2003  T:  10/19/2003  Job:  [id number]

## 2011-03-19 NOTE — H&P (Signed)
NAME:  Carlos Stone, Carlos Stone                         ACCOUNT NO.:  [id number]   MEDICAL RECORD NO.:  [id number]                   PATIENT TYPE:  OBV   LOCATION:  A312                                 FACILITY:  [facility]   PHYSICIAN:  Patrica Duel, M.D.                 DATE OF BIRTH:  [birth year]   DATE OF ADMISSION:  01/09/2004  DATE OF DISCHARGE:                                HISTORY & PHYSICAL   CHIEF COMPLAINT:  Nausea, vomiting, and diarrhea.   HISTORY OF PRESENT ILLNESS:  This is a 35 year old male with history of  Clostridium difficile colitis diagnosed approximately 1 month ago.  This  occurred soon after having undergone emergent appendectomy for acute  appendicitis.  During his hospitalization he had received some antibiotics.  He responded promptly and completely to Flagyl 500 t.i.d.   The patient underwent an arthroscopic knee procedure on December 24, 2003.  The patient developed diarrhea soon after surgery and took another course of  Flagyl with resolution.  The patient presented to the office today with a 12-  hour history of nausea, vomiting, and diarrhea.  The patient denies any  melena, hematemesis, or hematochezia.  He has had some chills and  temperature was documented at 100 degrees in the office.  Hospitalization  was suggested but refused.  A CBC was drawn.  He was given Phenergan,  Lomotil, and a course of Flagyl empirically.  Upon arrival at his home his  mother felt he was too sick to be home and he is admitted for hydration and  further evaluation and therapy as indicated.   There is no history of head trauma, chest pain, shortness of breath, cough,  sputum production, hemoptysis, or genitourinary symptoms.   PAST MEDICAL HISTORY:  As noted above.  He has had two knee procedures as  well as a tonsillectomy.   CURRENT MEDICATIONS:  None.   ALLERGIES:  Rash possibly secondary to FLAGYL noted on February 21.   SOCIAL HISTORY:  The patient denies the use of alcohol  or tobacco or other  illicit drugs.  He is employed at [employer].   REVIEW OF SYSTEMS:  Negative except as mentioned.   FAMILY HISTORY:  Noncontributory.   PHYSICAL EXAMINATION:  GENERAL:  A very pleasant male who appears somewhat  ill though is alert and oriented and well hydrated.  VITAL SIGNS:  Blood pressure 110/76, heart rate is 86 and regular,  temperature 100 degrees, respirations 18 and unlabored.  HEENT:  Normocephalic, atraumatic.  There is no scleral icterus.  Ears,  nose, and throat are benign.  NECK:  Supple without bruits, thyromegaly, or lymphadenopathy.  LUNGS:  Clear to A&P.  HEART:  Heart sounds are normal without murmurs, rubs, or gallops.  ABDOMEN:  Nontender.  Appendectomy scar is well healed.  EXTREMITIES:  No clubbing, cyanosis, or edema.   LABORATORY DATA:  Currently pending.   ASSESSMENT:  Clinically  the patient has gastroenteritis which is epidemic in  the area at this time.  He is at high risk for recurrent or partially-  treated C. difficile colitis.  Plan workup to include C. difficile toxin,  stool O&P and C&S, and routine laboratory.  Hydrate with normal saline,  potassium.  Will hold Flagyl given his possible allergy to this for now.  May use vancomycin if C. difficile assay is positive.     ___________________________________________                                         Patrica Duel, M.D.   MC/MEDQ  D:  01/09/2004  T:  01/09/2004  Job:  [id number]

## 2011-03-19 NOTE — Consult Note (Signed)
NAME:  Carlos Stone, Carlos Stone                         ACCOUNT NO.:  [id number]   MEDICAL RECORD NO.:  [id number]                   PATIENT TYPE:  OBV   LOCATION:  A312                                 FACILITY:  [facility]   PHYSICIAN:  R. Roetta Sessions, M.D.              DATE OF BIRTH:  [birth year]   DATE OF CONSULTATION:  01/10/2004  DATE OF DISCHARGE:                                   CONSULTATION   REASON FOR CONSULTATION:  Recurrent nausea, vomiting, and diarrhea.  History  of C. difficile infection.   HISTORY OF PRESENT ILLNESS:  Carlos Stone is a pleasant 35 year old  Caucasian male who was in his usual state of good health until January of  this year when he developed acute appendicitis.  He presented to [facility] emergency department and ultimately was admitted and had an open  appendectomy by Dr. Malvin Johns.  He had a retrocecal appendix.  Within several  days of surgery he developed copious essentially nonbloody, watery diarrhea  and intermittent nausea and vomiting.  He reportedly was found to have a  positive C. difficile toxin assay on stool and was given a course of Flagyl.  His symptoms resolved.  Subsequently he went for a right knee arthroscopy  here by Dr. Romeo Apple last month and developed similar symptoms within a few  days following surgery.  Again, he took 1 weeks worth of Flagyl and stopped  and completed the therapy 6 days ago. He was much improved, but 3 days ago  he developed, recurrent nausea and vomiting and nonbloody diarrhea.  His  mother tells me that he has lost 20 pounds.  He has been feeling miserable.  With his last course of Flagyl he developed a rash.  He has been admitted to  the hospital for further intervention. He has been started on vancomycin 125  mg orally p.o. q.i.d. yesterday.  He has only had a couple of nonbloody  watery bowel movements today, nausea somewhat improved.  Initially he had a  white count of 15,700; today it is 10,800; H&H  12.6 and 37.2.  Bilirubin is  110 __________ above normal at 1.3.  His transaminases and alkaline  phosphatase were normal.  Repeat C. difficile toxin assay on stool remains  pending.  He has been febrile to 102 over the past 24 hours.   He denies any prior history of similar symptoms.  There is no family history  of inflammatory bowel disease or colorectal neoplasia.   There is no one else around Carlos Stone who is ill.  He has not traveled  anywhere unusual recently and [place]   PAST MEDICAL HISTORY:  Past medical history significant for right knee  arthroscopy, appendectomy, denies chronic illnesses.   MEDICATIONS:  Has not been on any chronic prescription medications.  No over-  the-counter agents on a regular basis.   ALLERGIES:  Rash possibly linked to Flagyl.   SOCIAL HISTORY:  The patient is married and has a 50-year-old daughter.  He  works for [employer].  No tobacco, no alcohol.   FAMILY HISTORY:  Negative for chronic GI or liver disease.   REVIEW OF SYSTEMS:  No odynophagia, dysphagia, or early satiety or reflux  symptoms.  No melena or rectal bleeding.  A 20-pound weight loss recently.   PHYSICAL EXAMINATION:  GENERAL:  Reveals a pleasant, 35 year old gentleman,  alert, conversant, appears not to feel well, but does not appear acutely  toxic.  VITAL SIGNS:  BP 107/59, pulse 90, respiratory rate 20.  SKIN:  Warm and dry.  There is no jaundice.  No cutaneous stigmata of  chronic liver disease.  HEENT:  No scleral icterus.  Oral cavity no lesions.  NECK:  JVD is not prominent.  CHEST:  Lungs are clear to auscultation.  CARDIAC:  Regular rate and rhythm without murmur, gallop, or rub.  ABDOMEN:  Nondistended.  He has a right lower quadrant surgical scar,  recent, well-healed.  Positive bowel sounds.  Soft, nontender, without  appreciable mass or organomegaly.  EXTREMITIES:  No edema. He has an arthroscopy wound which is  healing right  now.   ADDITIONAL LABORATORY DATA:  Sodium 131, potassium 3.5, chloride 102, CO2  21, glucose 122, BUN 12, creatinine 0.8, calcium 8.9, total protein 5.8,  albumin 3.4.   IMPRESSION:  Carlos Stone is a pleasant, 35 year old gentleman who  has had 2 surgeries recently, each of which are temporarily related to a  postoperative illness characterized by essentially nonbloody watery  diarrhea, nausea and vomiting.  At least 1 Clostridium difficile toxin assay  returned positive. He improved on the first 2 occasions with Flagyl;  however, he developed a rash with the most recent Flagyl use. He is now  going to be started on vancomycin empirically and is notably improved over  the first 24 hours of hospitalization.   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  I doubt he has some other  enteric infection or new onset inflammatory bowel disease.   RECOMMENDATIONS:  Would increase his vancomycin to 250 mg orally q.i.d. and  put him on tapering regimen over the next 3 weeks, i.e. 250 q.i.d. x7 days  125 mg q.i.d. x7 days then 125 mg b.i.d. x7 days.  Add Lactinex as a  probiotic 2 capsules with meals t.i.d. x1 month.  Hopefully Carlos Stone will  enjoy additional marked improvement over the next 24-48 hours.   I would like to thank Dr. Patrica Duel for allowing me to see this nice  gentleman today.      ___________________________________________                                            Jonathon Bellows, M.D.   RMR/MEDQ  D:  01/10/2004  T:  01/10/2004  Job:  [id number]   cc:   Patrica Duel, M.D.  [address]  [place]  [place] [address]  Fax: [phone]   R. Roetta Sessions, M.D.  [address]  [place]  [place] [address]  Fax: [phone]

## 2011-03-19 NOTE — H&P (Signed)
NAME:  Carlos Stone, Carlos Stone                         ACCOUNT NO.:  [id number]   MEDICAL RECORD NO.:  [id number]                   PATIENT TYPE:  AMB   LOCATION:  DAY                                  FACILITY:  [facility]   PHYSICIAN:  Vickki Hearing, M.D.           DATE OF BIRTH:  05-14-1976   DATE OF ADMISSION:  DATE OF DISCHARGE:                                HISTORY & PHYSICAL   CHIEF COMPLAINT:  1. ACL deficient right knee.  2. Osteochondritis desiccans right knee.  3. Lateral meniscal tear right knee.   HISTORY:  Carlos Stone is 35 years old.  He had a knee injury several years  ago.  He had a loose cartilaginous fragment removed from the knee, developed  significant pain, swelling and loose body was diagnosed on MRI.  He  underwent arthroscopy on September 25, 2003.  At that time we found him to  have a partially torn ACL with instability.  A medial femoral condyle defect  with attempted cartilaginous ingrowth which was not successful.  No loose  bodies were found.  His meniscal tear seemed to be nonsurgical.  His ACL  appeared to have some acute hemorrhage in it and was definitely deficient  and loose and his exam under anesthesia confirmed that.   He presents at this time for reconstruction of his ACL, grafting of his  medial femoral condyle defect.   REVIEW OF SYSTEMS:  Normal x 10 systems.   ALLERGIES:  PENICILLIN.   MEDICAL PROBLEMS:  None.   SURGERY:  1. 1990, knee surgery.  2. 1995, knee surgery.  3. Tonsillectomy in 1987.   CURRENT MEDICATION:  Lorcet Plus.   FAMILY HISTORY:  Negative.   FAMILY PHYSICIAN:  Annitta Jersey, MD.   SOCIAL HISTORY:  He is married.  He works in a Doctor, hospital.  Does not  smoke or drink.  Grade completed up through the 12th.   PHYSICAL EXAMINATION:  He is well-developed and nourished.  Grooming and  hygiene are normal, no deformity.  Medium frame, medium height.  VITAL SIGNS:  Weight as recorded.  Pulse 74, respiratory rate  20.  CARDIOVASCULAR:  Normal pulse, perfusion.  EXTREMITIES:  No edema, tenderness, swelling, varicosities.  LYMPHS:  Cervical adenopathy none.  SKIN:  Normal all four extremities and head and neck.  NEUROLOGIC EXAM:  Normal sensation and reflexes.  He is alert, awake and  oriented x 3.  Mood and affect normal.   Right knee range of motion 0 to 110, mild joint effusion, some lateral joint  line tenderness.  Instability was noted under anesthesia.  Muscle tone and  strength normal.   IMPRESSION:  ACL deficient right knee.   PLAN:  Arthroscopic-assisted allograft ACL reconstruction and  autograft/chondrograft medial femoral condyle.   Outpatient surgery.     ___________________________________________  Vickki Hearing, M.D.   SEH/MEDQ  D:  10/17/2003  T:  10/17/2003  Job:  [id number]

## 2011-03-19 NOTE — Discharge Summary (Signed)
NAME:  Carlos Stone, Carlos Stone                         ACCOUNT NO.:  [id number]   MEDICAL RECORD NO.:  [id number]                   PATIENT TYPE:  INP   LOCATION:  A313                                 FACILITY:  [facility]   PHYSICIAN:  Barbaraann Barthel, M.D.              DATE OF BIRTH:  [birth year]   DATE OF ADMISSION:  11/27/2003  DATE OF DISCHARGE:  11/29/2003                                 DISCHARGE SUMMARY   DIAGNOSES:  1. Acute appendicitis.  2. Status post right knee surgery.   PROCEDURE:  On November 27, 2003, appendectomy.   Note:  This is a 34 year old white male who presented with right lower  quadrant pain and nausea for approximately 36 hours prior to his admission  to the emergency room.  CT scan confirmed the presence of a retrocecal acute  appendicitis and surgery was consulted.  We saw him in the emergency room,  and then after hydration and antibiotic therapy, he was taken to surgery  where an acute __________ nonperforated retrocecal appendix was encountered.  This was done as an open procedure.  The patient did well postoperatively,  and he had no problems with his orthopedic condition.  He was discharged on  the second postoperative day.  At that time, he was afebrile.  His wound was  clean without sign of infection.  He had no shortness of breath or dysoria  and no problems with any orthopedic complaints.   LABORATORY DATA:  He was admitted with a white count of 11,000 with a left  shift.  On November 28, 2003, his white count was down to 7.5 with an H&H of  11.7 and 34.3.  His metabolic-7 was grossly within normal limits.  His  urinalysis was grossly within normal limits.   As mentioned, his CT scan showed signs of acute appendicitis that was  retrocecal.   The final pathology report is not on the chart at the time of this  dictation.   DISCHARGE INSTRUCTIONS:  He is excused from work.  He is discharged on a  full liquid and soft diet.  He is told to increase his  activity as per his  orthopedic physical therapy regimen.  He is permitted to shower.  He is told  to do no heavy lifting, or driving, or sexual activity in the immediate  postoperative period.  He is permitted to walk upstairs if that is permitted  by Dr. __________ physical therapy.   DISCHARGE MEDICATIONS:  1. He has Lorcet from his orthopedic surgery, and he is told that he can     take that as needed for pain.  2. He is told to take no aspirin products.  3. Told to clean his wound with alcohol three times a day.  4. We will continue Cipro 500 mg twice a day for an additional three days to     give him a full five days' worth of  therapy.  5. He is also permitted to take Colace one tablet or two tablets daily as     needed for his bowels.  He is told to stay away from any Ex-Lax or any     harsh cathartics.   We will followup with him in the office and arrangements were made for that.  He is also told to followup with Dr. __________ as needed for his orthopedic  followup.     ___________________________________________                                         Barbaraann Barthel, M.D.   WB/MEDQ  D:  11/29/2003  T:  11/30/2003  Job:  [id number]   cc:   Romeo Apple, Dr.

## 2013-09-21 ENCOUNTER — Encounter (HOSPITAL_COMMUNITY): Payer: Self-pay | Admitting: Emergency Medicine

## 2013-09-21 ENCOUNTER — Emergency Department (HOSPITAL_COMMUNITY): Payer: 59

## 2013-09-21 ENCOUNTER — Inpatient Hospital Stay (HOSPITAL_COMMUNITY)
Admission: EM | Admit: 2013-09-21 | Discharge: 2013-09-22 | DRG: 195 | Disposition: A | Discharge status: Home / Self Care | Payer: 59 | Attending: Internal Medicine | Admitting: Internal Medicine

## 2013-09-21 ENCOUNTER — Other Ambulatory Visit: Payer: Self-pay

## 2013-09-21 DIAGNOSIS — F411 Generalized anxiety disorder: Secondary | ICD-10-CM | POA: Diagnosis present

## 2013-09-21 DIAGNOSIS — R0781 Pleurodynia: Secondary | ICD-10-CM

## 2013-09-21 DIAGNOSIS — E876 Hypokalemia: Secondary | ICD-10-CM | POA: Diagnosis present

## 2013-09-21 DIAGNOSIS — J189 Pneumonia, unspecified organism: Secondary | ICD-10-CM

## 2013-09-21 DIAGNOSIS — R071 Chest pain on breathing: Secondary | ICD-10-CM

## 2013-09-21 HISTORY — DX: Meningitis, unspecified: G03.9

## 2013-09-21 HISTORY — DX: Anxiety disorder, unspecified: F41.9

## 2013-09-21 HISTORY — DX: Pneumonia, unspecified organism: J18.9

## 2013-09-21 LAB — CBC WITH DIFFERENTIAL/PLATELET
Eosinophils Relative: 0 % (ref 0–5)
HCT: 41.6 % (ref 39.0–52.0)
Lymphocytes Relative: 16 % (ref 12–46)
Lymphs Abs: 2.6 10*3/uL (ref 0.7–4.0)
MCV: 86.3 fL (ref 78.0–100.0)
Monocytes Absolute: 1.5 10*3/uL — ABNORMAL HIGH (ref 0.1–1.0)
RBC: 4.82 MIL/uL (ref 4.22–5.81)
WBC: 16.7 10*3/uL — ABNORMAL HIGH (ref 4.0–10.5)

## 2013-09-21 LAB — BASIC METABOLIC PANEL
BUN: 14 mg/dL (ref 6–23)
CO2: 28 mEq/L (ref 19–32)
Chloride: 99 mEq/L (ref 96–112)
Creatinine, Ser: 0.96 mg/dL (ref 0.50–1.35)

## 2013-09-21 MED ORDER — HYDROCODONE-ACETAMINOPHEN 5-325 MG PO TABS: 1.0000 | ORAL_TABLET | ORAL | Status: DC | PRN

## 2013-09-21 MED ORDER — SODIUM CHLORIDE 0.9 % IV SOLN
INTRAVENOUS | Status: DC
  Administered 2013-09-21: 1000 mL via INTRAVENOUS

## 2013-09-21 MED ORDER — LEVOFLOXACIN IN D5W 250 MG/50ML IV SOLN
INTRAVENOUS | Status: AC
  Filled 2013-09-21: qty 50

## 2013-09-21 MED ORDER — FENTANYL CITRATE 0.05 MG/ML IJ SOLN
50.0000 ug | Freq: Once | INTRAMUSCULAR | Status: AC
  Administered 2013-09-21: 50 ug via INTRAVENOUS
  Filled 2013-09-21: qty 2

## 2013-09-21 MED ORDER — KETOROLAC TROMETHAMINE 30 MG/ML IJ SOLN
30.0000 mg | Freq: Once | INTRAMUSCULAR | Status: AC
  Administered 2013-09-21: 30 mg via INTRAVENOUS
  Filled 2013-09-21: qty 1

## 2013-09-21 MED ORDER — SODIUM CHLORIDE 0.9 % IV BOLUS (SEPSIS)
1000.0000 mL | Freq: Once | INTRAVENOUS | Status: AC
  Administered 2013-09-21: 1000 mL via INTRAVENOUS

## 2013-09-21 MED ORDER — POTASSIUM CHLORIDE CRYS ER 20 MEQ PO TBCR
40.0000 meq | EXTENDED_RELEASE_TABLET | ORAL | Status: AC
  Administered 2013-09-21 – 2013-09-22 (×2): 40 meq via ORAL
  Filled 2013-09-21 (×2): qty 2

## 2013-09-21 MED ORDER — ONDANSETRON HCL 4 MG PO TABS: 4.0000 mg | ORAL_TABLET | Freq: Four times a day (QID) | ORAL | Status: DC | PRN

## 2013-09-21 MED ORDER — ONDANSETRON HCL 4 MG/2ML IJ SOLN
4.0000 mg | Freq: Four times a day (QID) | INTRAMUSCULAR | Status: DC | PRN
  Administered 2013-09-22: 4 mg via INTRAVENOUS
  Filled 2013-09-21: qty 2

## 2013-09-21 MED ORDER — GUAIFENESIN ER 600 MG PO TB12
1200.0000 mg | ORAL_TABLET | Freq: Two times a day (BID) | ORAL | Status: DC
  Administered 2013-09-21 – 2013-09-22 (×2): 1200 mg via ORAL
  Filled 2013-09-21 (×6): qty 2

## 2013-09-21 MED ORDER — ENOXAPARIN SODIUM 40 MG/0.4ML ~~LOC~~ SOLN
40.0000 mg | SUBCUTANEOUS | Status: DC
  Administered 2013-09-21: 40 mg via SUBCUTANEOUS
  Filled 2013-09-21: qty 0.4

## 2013-09-21 MED ORDER — ACETAMINOPHEN 500 MG PO TABS
1000.0000 mg | ORAL_TABLET | Freq: Once | ORAL | Status: AC
Start: 2013-09-21 — End: 2013-09-21
  Administered 2013-09-21: 1000 mg via ORAL
  Filled 2013-09-21: qty 2

## 2013-09-21 MED ORDER — CITALOPRAM HYDROBROMIDE 20 MG PO TABS
40.0000 mg | ORAL_TABLET | Freq: Every day | ORAL | Status: DC
  Administered 2013-09-21 – 2013-09-22 (×2): 40 mg via ORAL
  Filled 2013-09-21 (×2): qty 2

## 2013-09-21 MED ORDER — LEVOFLOXACIN IN D5W 750 MG/150ML IV SOLN
750.0000 mg | INTRAVENOUS | Status: DC
  Filled 2013-09-21 (×2): qty 150

## 2013-09-21 MED ORDER — SODIUM CHLORIDE 0.9 % IV SOLN
INTRAVENOUS | Status: DC
  Administered 2013-09-21 – 2013-09-22 (×2): 1000 mL via INTRAVENOUS

## 2013-09-21 MED ORDER — LEVOFLOXACIN IN D5W 500 MG/100ML IV SOLN
500.0000 mg | Freq: Once | INTRAVENOUS | Status: AC
  Administered 2013-09-21: 500 mg via INTRAVENOUS
  Filled 2013-09-21: qty 100

## 2013-09-21 MED ORDER — LEVOFLOXACIN IN D5W 250 MG/50ML IV SOLN
250.0000 mg | Freq: Once | INTRAVENOUS | Status: AC
  Administered 2013-09-21: 250 mg via INTRAVENOUS
  Filled 2013-09-21: qty 50

## 2013-09-21 NOTE — ED Notes (Addendum)
Cough began 1 month ago.  Has progressed to the point R lateral chest is sore from coughing.  Painful with breathing, coughing or moving. Denies n/v/d, has had chills.  Took ibuprofen this morning w/out relief. Began vomiting in triage.

## 2013-09-21 NOTE — Progress Notes (Signed)
ANTIBIOTIC CONSULT NOTE - INITIAL  Pharmacy Consult for Levaquin Indication: rule out pneumonia  Allergies  Allergen Reactions  . Compazine [Prochlorperazine Edisylate]     Hallucination   . Amoxicillin Rash  . Penicillins Rash    Patient Measurements: Height: 5\' 9"  (175.3 cm) Weight: 200 lb (90.719 kg) IBW/kg (Calculated) : 70.7  Vital Signs: Temp: 100 F (37.8 C) (11/21 1846) Temp src: Oral (11/21 1846) BP: 121/64 mmHg (11/21 1900) Pulse Rate: 84 (11/21 1800) Intake/Output from previous day:   Intake/Output from this shift:    Labs:  Recent Labs  09/21/13 1438  WBC 16.7*  HGB 14.0  PLT 297  CREATININE 0.96   Estimated Creatinine Clearance: 115 ml/min (by C-G formula based on Cr of 0.96). No results found for this basename: VANCOTROUGH, VANCOPEAK, VANCORANDOM, GENTTROUGH, GENTPEAK, GENTRANDOM, TOBRATROUGH, TOBRAPEAK, TOBRARND, AMIKACINPEAK, AMIKACINTROU, AMIKACIN,  in the last 72 hours   Microbiology: No results found for this or any previous visit (from the past 720 hour(s)).  Medical History: Past Medical History  Diagnosis Date  . Meningitis   . Anxiety disorder     Medications:  Prescriptions prior to admission  Medication Sig Dispense Refill  . citalopram (CELEXA) 40 MG tablet Take 40 mg by mouth daily.      Marland Kitchen PE-Diphenhydramine-DM-GG-APAP (MUCINEX FAST-MAX DAY/NIGHT PO) Take 10-20 mLs by mouth daily as needed (cough and cold symptoms).       Assessment: Okay for Protocol, patient received 500mg  IV x 1 in ED.  Levaquin 11/21 >>  Goal of Therapy:  Eradicate infection.  Plan:  Additional 250mg  IV Levaquin this evening, followed by 750mg  IV every 24 hours. Follow up culture results  Mady Gemma 09/21/2013,8:39 PM

## 2013-09-21 NOTE — H&P (Signed)
PCP:   Colette Ribas, MD   Chief Complaint:  Fever and cough  HPI: 37 year old male who came to the ED after patient developed pain in the right ribs after patient has been coughing for almost a month. Patient says that the cough started almost a month ago, he denies fever but was found to have temperature of 101 in the ED. He denies nausea vomiting or diarrhea. He denies shortness of breath on exertion, denies chest pain except the right-sided rib pain. He denies expectoration. Patient does not smoke cigarettes or alcohol. Does not take street drugs.  Allergies:   Allergies  Allergen Reactions  . Compazine [Prochlorperazine Edisylate]     Hallucination   . Amoxicillin Rash  . Penicillins Rash      Past Medical History  Diagnosis Date  . Meningitis   . Anxiety disorder     Past Surgical History  Procedure Laterality Date  . Knee arthroscopy    . Appendectomy      Prior to Admission medications   Medication Sig Start Date End Date Taking? Authorizing Provider  citalopram (CELEXA) 40 MG tablet Take 40 mg by mouth daily.   Yes Historical Provider, MD  PE-Diphenhydramine-DM-GG-APAP (MUCINEX FAST-MAX DAY/NIGHT PO) Take 10-20 mLs by mouth daily as needed (cough and cold symptoms).   Yes Historical Provider, MD    Social History:  reports that he has never smoked. He does not have any smokeless tobacco history on file. He reports that he does not drink alcohol or use illicit drugs.  History reviewed. No pertinent family history.   All the positives are listed in BOLD  Review of Systems:  HEENT: Headache, blurred vision, runny nose, sore throat Neck: Hypothyroidism, hyperthyroidism,,lymphadenopathy Chest : Shortness of breath, history of COPD, Asthma, cough Heart : Chest pain, history of coronary arterey disease GI:  Nausea, vomiting, diarrhea, constipation, GERD GU: Dysuria, urgency, frequency of urination, hematuria Neuro: Stroke, seizures, syncope Psych:  Depression, anxiety, hallucinations   Physical Exam: Blood pressure 134/70, pulse 89, temperature 100 F (37.8 C), temperature source Oral, resp. rate 30, height 5\' 9"  (1.753 m), weight 90.719 kg (200 lb), SpO2 95.00%. Constitutional:   Patient is a well-developed and well-nourished male in no acute distress and cooperative with exam. Head: Normocephalic and atraumatic Mouth: Mucus membranes moist Eyes: PERRL, EOMI, conjunctivae normal Neck: Supple, No Thyromegaly Cardiovascular: RRR, S1 normal, S2 normal Pulmonary/Chest: Bibasilar crackles Abdominal: Soft. Non-tender, non-distended, bowel sounds are normal, no masses, organomegaly, or guarding present.  Neurological: A&O x3, Strenght is normal and symmetric bilaterally, cranial nerve II-XII are grossly intact, no focal motor deficit, sensory intact to light touch bilaterally.  Extremities : No Cyanosis, Clubbing or Edema   Labs on Admission:  Results for orders placed during the hospital encounter of 09/21/13 (from the past 48 hour(s))  CBC WITH DIFFERENTIAL     Status: Abnormal   Collection Time    09/21/13  2:38 PM      Result Value Range   WBC 16.7 (*) 4.0 - 10.5 K/uL   RBC 4.82  4.22 - 5.81 MIL/uL   Hemoglobin 14.0  13.0 - 17.0 g/dL   HCT 16.1  09.6 - 04.5 %   MCV 86.3  78.0 - 100.0 fL   MCH 29.0  26.0 - 34.0 pg   MCHC 33.7  30.0 - 36.0 g/dL   RDW 40.9  81.1 - 91.4 %   Platelets 297  150 - 400 K/uL   Neutrophils Relative % 75  43 -  77 %   Neutro Abs 12.6 (*) 1.7 - 7.7 K/uL   Lymphocytes Relative 16  12 - 46 %   Lymphs Abs 2.6  0.7 - 4.0 K/uL   Monocytes Relative 9  3 - 12 %   Monocytes Absolute 1.5 (*) 0.1 - 1.0 K/uL   Eosinophils Relative 0  0 - 5 %   Eosinophils Absolute 0.0  0.0 - 0.7 K/uL   Basophils Relative 0  0 - 1 %   Basophils Absolute 0.0  0.0 - 0.1 K/uL  TROPONIN I     Status: None   Collection Time    09/21/13  2:38 PM      Result Value Range   Troponin I <0.30  <0.30 ng/mL   Comment:            Due  to the release kinetics of cTnI,     a negative result within the first hours     of the onset of symptoms does not rule out     myocardial infarction with certainty.     If myocardial infarction is still suspected,     repeat the test at appropriate intervals.  BASIC METABOLIC PANEL     Status: Abnormal   Collection Time    09/21/13  2:38 PM      Result Value Range   Sodium 138  135 - 145 mEq/L   Potassium 3.2 (*) 3.5 - 5.1 mEq/L   Chloride 99  96 - 112 mEq/L   CO2 28  19 - 32 mEq/L   Glucose, Bld 153 (*) 70 - 99 mg/dL   BUN 14  6 - 23 mg/dL   Creatinine, Ser 1.61  0.50 - 1.35 mg/dL   Calcium 9.7  8.4 - 09.6 mg/dL   GFR calc non Af Amer >90  >90 mL/min   GFR calc Af Amer >90  >90 mL/min   Comment: (NOTE)     The eGFR has been calculated using the CKD EPI equation.     This calculation has not been validated in all clinical situations.     eGFR's persistently <90 mL/min signify possible Chronic Kidney     Disease.  D-DIMER, QUANTITATIVE     Status: None   Collection Time    09/21/13  4:56 PM      Result Value Range   D-Dimer, Quant <0.27  0.00 - 0.48 ug/mL-FEU   Comment:            AT THE INHOUSE ESTABLISHED CUTOFF     VALUE OF 0.48 ug/mL FEU,     THIS ASSAY HAS BEEN DOCUMENTED     IN THE LITERATURE TO HAVE     A SENSITIVITY AND NEGATIVE     PREDICTIVE VALUE OF AT LEAST     98 TO 99%.  THE TEST RESULT     SHOULD BE CORRELATED WITH     AN ASSESSMENT OF THE CLINICAL     PROBABILITY OF DVT / VTE.    Radiological Exams on Admission: Dg Chest 2 View  09/21/2013   CLINICAL DATA:  Cough for 1 month, fever, chest pain  EXAM: CHEST  2 VIEW  COMPARISON:  10/08/2010  FINDINGS: Enlargement of cardiac silhouette.  Mediastinal contours and pulmonary vascularity normal.  Decreased lung volumes since previous exam with bibasilar atelectasis. Upper lungs clear.  No pleural effusion or pneumothorax.  Bones unremarkable.  IMPRESSION: Enlargement of cardiac silhouette.  Decreased lung  volumes with bibasilar atelectasis.   Electronically  Signed   By: Ulyses Southward M.D.   On: 09/21/2013 15:23   Dg Ribs Unilateral Right  09/21/2013   CLINICAL DATA:  Call, chest pain.  EXAM: RIGHT RIBS - 2 VIEW  COMPARISON:  Chest x-ray 09/21/2013.  FINDINGS: No evidence of displaced fracture or pneumothorax. Atelectasis versus pneumonia noted in the right lung base.  IMPRESSION: 1. Atelectasis versus pneumonia right lung base. 2. No evidence of displaced rib fracture or pneumothorax.   Electronically Signed   By: Maisie Fus  Register   On: 09/21/2013 16:18    Assessment/Plan Active Problems:   Community acquired pneumonia  hypokalemia  37 year old male admitted with Coumadin acquired pneumonia in his rib series x-rays shows atelectasis versus pneumonia right lung base. Patient does have elevated white count of 16,000 and temperature of 101.6. We'll start the patient on Levaquin. We'll obtain the blood cultures. EKG shows normal sinus rhythm and sinus arrhythmia. Troponin is negative. Will replace the potassium and check BMP in the a.m. Code status: Full code  Family discussion: Discussed with family at bedside   Time Spent on Admission: 70 min  Romaine Neville S [facility] Pager: [phone] 09/21/2013, 7:28 PM  If 7PM-7AM, please contact night-coverage  [email or web address]  Password [email or web address]

## 2013-09-21 NOTE — ED Provider Notes (Signed)
CSN: [id number]     Arrival date & time 09/21/13  1428 History  This chart was scribed for Ward Givens, MD by Caryn Bee, ED Scribe. This patient was seen in room APA08/APA08 and the patient's care was started 3:43 PM.    Chief Complaint  Patient presents with  . Cough  . Chest Pain   HPI HPI Comments: TAO SATZ is a 37 y.o. male who presents to the Emergency Department complaining of gradual onset productive cough that began one month ago. Pt reports gradual onset right sided chest pain onset due to excessive coughing that started yesterday. He reports associated SOB today because he can't breathe deeply. Pt also reports associated fever today today with chills onset yesterday. Pt denies vomiting, diarrhea, wheezing, ear pain, sore throat, rhinorrhea, wheezing, back pain, abdominal pain. He denies popping or cracking when he breathes. Pt regularly takes Celexa. Pt's family have all been sick lately with respiratory infections. Pt is not a smoker. 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    Pt's PCP is Dr. Assunta Found.    Past Medical History  Diagnosis Date  . Meningitis   . Anxiety disorder    Past Surgical History  Procedure Laterality Date  . Knee arthroscopy    . Appendectomy     History reviewed. No pertinent family history. History  Substance Use Topics  . Smoking status: Never Smoker   . Smokeless tobacco: Not on file  . Alcohol Use: No  employed Lives at home Lives with spouse  Review of Systems  Constitutional: Positive for fever and chills.  HENT: Negative for ear pain.   Respiratory: Positive for cough and shortness of breath. Negative for wheezing.   Cardiovascular: Positive for chest pain.  Gastrointestinal: Negative for nausea, vomiting, abdominal pain and diarrhea.  Musculoskeletal: Negative for back pain.  All other systems reviewed and are negative.    Allergies  Compazine; Amoxicillin; and Penicillins  Home Medications    Current Outpatient Rx  Name  Route  Sig  Dispense  Refill  . citalopram (CELEXA) 40 MG tablet   Oral   Take 40 mg by mouth daily.         Marland Kitchen PE-Diphenhydramine-DM-GG-APAP (MUCINEX FAST-MAX DAY/NIGHT PO)   Oral   Take 10-20 mLs by mouth daily as needed (cough and cold symptoms).          BP 113/49  Pulse 55  Temp(Src) 100.1 F (37.8 C) (Oral)  Resp 22  Ht 5\' 9"  (1.753 m)  Wt 200 lb (90.719 kg)  BMI 29.52 kg/m2  SpO2 100%  Vital signs normal except bradycardia and low-grade temp   Physical Exam  Nursing note and vitals reviewed. Constitutional: He is oriented to person, place, and time. Vital signs are normal. He appears well-developed and well-nourished.  HENT:  Head: Normocephalic and atraumatic.  Nose: Nose normal.  Eyes: Conjunctivae and EOM are normal. Pupils are equal, round, and reactive to light.  Cardiovascular: Normal rate, regular rhythm, normal heart sounds and intact distal pulses.   Pulmonary/Chest: Effort normal. He has decreased breath sounds. He exhibits no tenderness, no crepitus and no deformity.  Seems painful when he changes positions, holding his right lower chest.   Abdominal: Soft. Normal appearance and bowel sounds are normal. There is no tenderness. There is no rebound and no guarding.  Musculoskeletal: Normal range of motion.  Neurological: He is alert and oriented to person, place, and time. He has normal strength. No  cranial nerve deficit.  Skin: Skin is warm and dry. No abrasion, no bruising, no ecchymosis and no laceration noted.  Psychiatric: He has a normal mood and affect.    ED Course  Procedures (including critical care time)  Medications  0.9 %  sodium chloride infusion (not administered)  guaiFENesin (MUCINEX) 12 hr tablet 1,200 mg (not administered)  acetaminophen (TYLENOL) tablet 1,000 mg (1,000 mg Oral Given 09/21/13 1503)  ketorolac (TORADOL) 30 MG/ML injection 30 mg (30 mg Intravenous Given 09/21/13 1558)  sodium  chloride 0.9 % bolus 1,000 mL (1,000 mLs Intravenous New Bag/Given 09/21/13 1558)  fentaNYL (SUBLIMAZE) injection 50 mcg (50 mcg Intravenous Given 09/21/13 1559)  levofloxacin (LEVAQUIN) IVPB 500 mg (0 mg Intravenous Stopped 09/21/13 1740)    DIAGNOSTIC STUDIES: Oxygen Saturation is 100% on [place], normal by my interpretation.    COORDINATION OF CARE: 3:50 PM-Discussed treatment plan with pt at bedside and pt agreed to plan. Will get rib xrays to see if he has a rib fracture to explain his pain, also will check ddimer to screen for PE.   Patient did spike a fever while in the ED. It was treated with Tylenol.   5:31 PM- Informed pt of pneumonia noted in x-ray on the right side on his rib films  Nursing staff ambulated patient and his pulse ox was  95% on RA  6:22 PM- Asked pt if he felt up to going home. He stated that he is worried about the pain. Wife would like him admitted. She states "When he gets sick he gets really sick and has had to be admitted for meningitis for a week after being sent home and for C diff".   18:47 Dr Kerry Hough, admit to Dr Lang Snow, med-surg  Labs Review Results for orders placed during the hospital encounter of 09/21/13  CBC WITH DIFFERENTIAL      Result Value Range   WBC 16.7 (*) 4.0 - 10.5 K/uL   RBC 4.82  4.22 - 5.81 MIL/uL   Hemoglobin 14.0  13.0 - 17.0 g/dL   HCT 16.1  09.6 - 04.5 %   MCV 86.3  78.0 - 100.0 fL   MCH 29.0  26.0 - 34.0 pg   MCHC 33.7  30.0 - 36.0 g/dL   RDW 40.9  81.1 - 91.4 %   Platelets 297  150 - 400 K/uL   Neutrophils Relative % 75  43 - 77 %   Neutro Abs 12.6 (*) 1.7 - 7.7 K/uL   Lymphocytes Relative 16  12 - 46 %   Lymphs Abs 2.6  0.7 - 4.0 K/uL   Monocytes Relative 9  3 - 12 %   Monocytes Absolute 1.5 (*) 0.1 - 1.0 K/uL   Eosinophils Relative 0  0 - 5 %   Eosinophils Absolute 0.0  0.0 - 0.7 K/uL   Basophils Relative 0  0 - 1 %   Basophils Absolute 0.0  0.0 - 0.1 K/uL  TROPONIN I      Result Value Range   Troponin I  <0.30  <0.30 ng/mL  BASIC METABOLIC PANEL      Result Value Range   Sodium 138  135 - 145 mEq/L   Potassium 3.2 (*) 3.5 - 5.1 mEq/L   Chloride 99  96 - 112 mEq/L   CO2 28  19 - 32 mEq/L   Glucose, Bld 153 (*) 70 - 99 mg/dL   BUN 14  6 - 23 mg/dL   Creatinine, Ser 7.82  0.50 -  1.35 mg/dL   Calcium 9.7  8.4 - 16.1 mg/dL   GFR calc non Af Amer >90  >90 mL/min   GFR calc Af Amer >90  >90 mL/min  D-DIMER, QUANTITATIVE      Result Value Range   D-Dimer, Quant <0.27  0.00 - 0.48 ug/mL-FEU   Laboratory interpretation all normal except leukocytosis, hypokalemia   Imaging Review Dg Chest 2 View  09/21/2013   CLINICAL DATA:  Cough for 1 month, fever, chest pain  EXAM: CHEST  2 VIEW  COMPARISON:  10/08/2010  FINDINGS: Enlargement of cardiac silhouette.  Mediastinal contours and pulmonary vascularity normal.  Decreased lung volumes since previous exam with bibasilar atelectasis. Upper lungs clear.  No pleural effusion or pneumothorax.  Bones unremarkable.  IMPRESSION: Enlargement of cardiac silhouette.  Decreased lung volumes with bibasilar atelectasis.   Electronically Signed   By: Ulyses Southward M.D.   On: 09/21/2013 15:23   Dg Ribs Unilateral Right  09/21/2013   CLINICAL DATA:  Call, chest pain.  EXAM: RIGHT RIBS - 2 VIEW  COMPARISON:  Chest x-ray 09/21/2013.  FINDINGS: No evidence of displaced fracture or pneumothorax. Atelectasis versus pneumonia noted in the right lung base.  IMPRESSION: 1. Atelectasis versus pneumonia right lung base. 2. No evidence of displaced rib fracture or pneumothorax.   Electronically Signed   By: Maisie Fus  Register   On: 09/21/2013 16:18    EKG Interpretation    Date/Time:  Friday September 21 2013 14:33:24 EST Ventricular Rate:  70 PR Interval:  158 QRS Duration: 84 QT Interval:  364 QTC Calculation: 393 R Axis:   23 Text Interpretation:  Normal sinus rhythm with sinus arrhythmia Normal ECG No significant change since last tracing Confirmed by Breydon Senters  MD-I, Keawe Marcello  ([id number]) on 09/21/2013 6:00:29 PM           Date: 09/21/2013  Rate: 70  Rhythm: normal sinus rhythm  QRS Axis: normal  Intervals: normal  ST/T Wave abnormalities: normal  Conduction Disutrbances:none  Narrative Interpretation:   Old EKG Reviewed: unchanged from 07/14/2010    MDM   1. CAP (community acquired pneumonia)   2. Pleuritic chest pain   3. Hypokalemia     Plan admission  Devoria Albe, MD, FACEP   I personally performed the services described in this documentation, which was scribed in my presence. The recorded information has been reviewed and considered.     Ward Givens, MD 09/21/13 1859

## 2013-09-21 NOTE — ED Notes (Signed)
EDP in to evaluate 

## 2013-09-22 DIAGNOSIS — E876 Hypokalemia: Secondary | ICD-10-CM | POA: Diagnosis present

## 2013-09-22 DIAGNOSIS — R0781 Pleurodynia: Secondary | ICD-10-CM | POA: Diagnosis present

## 2013-09-22 LAB — URINALYSIS, ROUTINE W REFLEX MICROSCOPIC
Bilirubin Urine: NEGATIVE
Hgb urine dipstick: NEGATIVE
Nitrite: NEGATIVE
Specific Gravity, Urine: 1.01 (ref 1.005–1.030)
pH: 6.5 (ref 5.0–8.0)

## 2013-09-22 LAB — CBC
HCT: 36 % — ABNORMAL LOW (ref 39.0–52.0)
MCH: 29.3 pg (ref 26.0–34.0)
MCHC: 33.6 g/dL (ref 30.0–36.0)
MCV: 87.2 fL (ref 78.0–100.0)
Platelets: 206 10*3/uL (ref 150–400)
RDW: 13.3 % (ref 11.5–15.5)
WBC: 9.2 10*3/uL (ref 4.0–10.5)

## 2013-09-22 LAB — COMPREHENSIVE METABOLIC PANEL
AST: 70 U/L — ABNORMAL HIGH (ref 0–37)
Albumin: 3.3 g/dL — ABNORMAL LOW (ref 3.5–5.2)
BUN: 13 mg/dL (ref 6–23)
Calcium: 9 mg/dL (ref 8.4–10.5)
Chloride: 108 mEq/L (ref 96–112)
Creatinine, Ser: 0.87 mg/dL (ref 0.50–1.35)
Total Bilirubin: 0.9 mg/dL (ref 0.3–1.2)

## 2013-09-22 MED ORDER — HYDROCOD POLST-CHLORPHEN POLST 10-8 MG/5ML PO LQCR: 5.0000 mL | Freq: Two times a day (BID) | ORAL | Status: DC | PRN

## 2013-09-22 MED ORDER — LEVOFLOXACIN 750 MG PO TABS: 750.0000 mg | ORAL_TABLET | Freq: Every day | ORAL | Status: DC

## 2013-09-22 MED ORDER — HYDROCOD POLST-CHLORPHEN POLST 10-8 MG/5ML PO LQCR
5.0000 mL | Freq: Two times a day (BID) | ORAL | Status: DC | PRN
  Administered 2013-09-22: 5 mL via ORAL
  Filled 2013-09-22: qty 5

## 2013-09-22 MED ORDER — GUAIFENESIN ER 600 MG PO TB12: 1200.0000 mg | ORAL_TABLET | Freq: Two times a day (BID) | ORAL | Status: DC

## 2013-09-22 NOTE — Progress Notes (Signed)
Late entry:  AVS reviewed with patient.  Prescriptions and work note provided to patient.  Patient verbalized understanding of discharge instructions, medications and physician follow-up.  Pt's IV removed.  Site WNL.  Pt reports all belongings intact and in possession at time of discharge.  Patient transported by RN via w/c to main entrance for discharge.  Patient stable at time of discharge.

## 2013-09-22 NOTE — Discharge Summary (Signed)
Physician Discharge Summary  Carlos Stone JXB:[id number] DOB: December 01, 1975 DOA: 09/21/2013  PCP: Colette Ribas, MD  Admit date: 09/21/2013 Discharge date: 09/22/2013  Time spent: 35 minutes  Recommendations for Outpatient Follow-up:  1. Follow up with primary care physician in 2 weeks and consider repeat chest xray to ensure clearing of pneumonia  Discharge Diagnoses:  Active Problems:   Community acquired pneumonia Hypokalemia Right sided pleuritic chest pain  Discharge Condition: improved  Diet recommendation: regular diet  Filed Weights   09/21/13 1432 09/21/13 2026  Weight: 90.719 kg (200 lb) 95.391 kg (210 lb 4.8 oz)    History of present illness:  37 year old male who came to the ED after patient developed pain in the right ribs after patient has been coughing for almost a month. Patient says that the cough started almost a month ago, he denies fever but was found to have temperature of 101 in the ED. He denies nausea vomiting or diarrhea. He denies shortness of breath on exertion, denies chest pain except the right-sided rib pain. He denies expectoration. Patient does not smoke cigarettes or alcohol. Does not take street drugs.   Hospital Course:  This patient was admitted to the hospital with cough, shortness of breath, community acquired pneumonia. He was observed in the hospital overnight, provide IV fluids and IV antibiotics. The following day patient reported significant improvement. He was ambulated on room air and oxygen saturations mainly remained in the 95% range. He did not have any further fevers while in the hospital and leukocytosis resolved to normal. He be transitioned to oral antibiotics to complete the course. He is recommended to followup with his primary care physician in the next one to 2 weeks consider repeat imaging to ensure clearing of his pneumonia. The current time, he has a dry nonproductive cough. He'll be also be prescribed Mucinex as well as  antitussives. He is otherwise stable for discharge.  Procedures:  none  Consultations:  none  Discharge Exam: Filed Vitals:   09/22/13 0712  BP: 115/68  Pulse: 65  Temp: 99.2 F (37.3 C)  Resp: 20    General: NAD Cardiovascular: S1, S2 RRR Respiratory: CTA B  Discharge Instructions  Discharge Orders   Future Orders Complete By Expires   Call MD for:  difficulty breathing, headache or visual disturbances  As directed    Call MD for:  severe uncontrolled pain  As directed    Call MD for:  temperature >100.4  As directed    Diet - low sodium heart healthy  As directed    Increase activity slowly  As directed        Medication List         chlorpheniramine-HYDROcodone 10-8 MG/5ML Lqcr  Commonly known as:  TUSSIONEX  Take 5 mLs by mouth every 12 (twelve) hours as needed for cough.     citalopram 40 MG tablet  Commonly known as:  CELEXA  Take 40 mg by mouth daily.     guaiFENesin 600 MG 12 hr tablet  Commonly known as:  MUCINEX  Take 2 tablets (1,200 mg total) by mouth 2 (two) times daily.     levofloxacin 750 MG tablet  Commonly known as:  LEVAQUIN  Take 1 tablet (750 mg total) by mouth daily.     MUCINEX FAST-MAX DAY/NIGHT PO  Take 10-20 mLs by mouth daily as needed (cough and cold symptoms).       Allergies  Allergen Reactions  . Compazine [Prochlorperazine Edisylate]  Hallucination   . Amoxicillin Rash  . Penicillins Rash      The results of significant diagnostics from this hospitalization (including imaging, microbiology, ancillary and laboratory) are listed below for reference.    Significant Diagnostic Studies: Dg Chest 2 View  09/21/2013   CLINICAL DATA:  Cough for 1 month, fever, chest pain  EXAM: CHEST  2 VIEW  COMPARISON:  10/08/2010  FINDINGS: Enlargement of cardiac silhouette.  Mediastinal contours and pulmonary vascularity normal.  Decreased lung volumes since previous exam with bibasilar atelectasis. Upper lungs clear.  No  pleural effusion or pneumothorax.  Bones unremarkable.  IMPRESSION: Enlargement of cardiac silhouette.  Decreased lung volumes with bibasilar atelectasis.   Electronically Signed   By: Ulyses Southward M.D.   On: 09/21/2013 15:23   Dg Ribs Unilateral Right  09/21/2013   CLINICAL DATA:  Call, chest pain.  EXAM: RIGHT RIBS - 2 VIEW  COMPARISON:  Chest x-ray 09/21/2013.  FINDINGS: No evidence of displaced fracture or pneumothorax. Atelectasis versus pneumonia noted in the right lung base.  IMPRESSION: 1. Atelectasis versus pneumonia right lung base. 2. No evidence of displaced rib fracture or pneumothorax.   Electronically Signed   By: Maisie Fus  Register   On: 09/21/2013 16:18    Microbiology: Recent Results (from the past 240 hour(s))  CULTURE, BLOOD (ROUTINE X 2)     Status: None   Collection Time    09/21/13 10:31 PM      Result Value Range Status   Specimen Description LEFT ANTECUBITAL   Final   Special Requests BOTTLES DRAWN AEROBIC AND ANAEROBIC 12CC EACH   Final   Culture NO GROWTH 1 DAY   Final   Report Status PENDING   Incomplete  CULTURE, BLOOD (ROUTINE X 2)     Status: None   Collection Time    09/21/13 10:35 PM      Result Value Range Status   Specimen Description BLOOD RIGHT HAND   Final   Special Requests BOTTLES DRAWN AEROBIC AND ANAEROBIC 6CC EACH   Final   Culture NO GROWTH 1 DAY   Final   Report Status PENDING   Incomplete     Labs: Basic Metabolic Panel:  Recent Labs Lab 09/21/13 1438 09/22/13 0656  NA 138 140  K 3.2* 3.8  CL 99 108  CO2 28 24  GLUCOSE 153* 111*  BUN 14 13  CREATININE 0.96 0.87  CALCIUM 9.7 9.0   Liver Function Tests:  Recent Labs Lab 09/22/13 0656  AST 70*  ALT 88*  ALKPHOS 77  BILITOT 0.9  PROT 6.0  ALBUMIN 3.3*   No results found for this basename: LIPASE, AMYLASE,  in the last 168 hours No results found for this basename: AMMONIA,  in the last 168 hours CBC:  Recent Labs Lab 09/21/13 1438 09/22/13 0656  WBC 16.7* 9.2   NEUTROABS 12.6*  --   HGB 14.0 12.1*  HCT 41.6 36.0*  MCV 86.3 87.2  PLT 297 206   Cardiac Enzymes:  Recent Labs Lab 09/21/13 1438  TROPONINI <0.30   BNP: BNP (last 3 results) No results found for this basename: PROBNP,  in the last 8760 hours CBG: No results found for this basename: GLUCAP,  in the last 168 hours     Signed:  MEMON,JEHANZEB  [facility] 09/22/2013, 3:00 PM

## 2013-09-22 NOTE — Progress Notes (Addendum)
Patient ambulated in hallway on room air.  Patient's O2 saturation 92-96% on room air while ambulating.  Most of the time the O2 saturation was 95%.  Patient tolerated well.  Patient c/o of dry non-productive cough.  Dr. Kerry Hough notified via text page.

## 2013-09-23 ENCOUNTER — Emergency Department (HOSPITAL_COMMUNITY): Payer: 59

## 2013-09-23 ENCOUNTER — Inpatient Hospital Stay (HOSPITAL_COMMUNITY)
Admission: EM | Admit: 2013-09-23 | Discharge: 2013-09-26 | DRG: 193 | Disposition: A | Discharge status: Home / Self Care | Payer: 59 | Attending: Family Medicine | Admitting: Family Medicine

## 2013-09-23 ENCOUNTER — Encounter (HOSPITAL_COMMUNITY): Payer: Self-pay | Admitting: Emergency Medicine

## 2013-09-23 DIAGNOSIS — Z9089 Acquired absence of other organs: Secondary | ICD-10-CM

## 2013-09-23 DIAGNOSIS — R0902 Hypoxemia: Secondary | ICD-10-CM

## 2013-09-23 DIAGNOSIS — J9600 Acute respiratory failure, unspecified whether with hypoxia or hypercapnia: Secondary | ICD-10-CM | POA: Diagnosis present

## 2013-09-23 DIAGNOSIS — R7401 Elevation of levels of liver transaminase levels: Secondary | ICD-10-CM | POA: Diagnosis present

## 2013-09-23 DIAGNOSIS — J189 Pneumonia, unspecified organism: Principal | ICD-10-CM | POA: Diagnosis present

## 2013-09-23 DIAGNOSIS — F411 Generalized anxiety disorder: Secondary | ICD-10-CM | POA: Diagnosis present

## 2013-09-23 DIAGNOSIS — R7402 Elevation of levels of lactic acid dehydrogenase (LDH): Secondary | ICD-10-CM | POA: Diagnosis present

## 2013-09-23 DIAGNOSIS — R0781 Pleurodynia: Secondary | ICD-10-CM | POA: Diagnosis present

## 2013-09-23 DIAGNOSIS — E876 Hypokalemia: Secondary | ICD-10-CM | POA: Diagnosis present

## 2013-09-23 DIAGNOSIS — Z8661 Personal history of infections of the central nervous system: Secondary | ICD-10-CM

## 2013-09-23 LAB — CBC WITH DIFFERENTIAL/PLATELET
Basophils Absolute: 0 10*3/uL (ref 0.0–0.1)
Eosinophils Absolute: 0 10*3/uL (ref 0.0–0.7)
Eosinophils Relative: 0 % (ref 0–5)
HCT: 41.4 % (ref 39.0–52.0)
Hemoglobin: 14 g/dL (ref 13.0–17.0)
Lymphocytes Relative: 9 % — ABNORMAL LOW (ref 12–46)
Lymphs Abs: 0.7 10*3/uL (ref 0.7–4.0)
MCV: 87 fL (ref 78.0–100.0)
Monocytes Absolute: 0.4 10*3/uL (ref 0.1–1.0)
Neutro Abs: 6.3 10*3/uL (ref 1.7–7.7)
Neutrophils Relative %: 85 % — ABNORMAL HIGH (ref 43–77)
Platelets: 222 10*3/uL (ref 150–400)
RBC: 4.76 MIL/uL (ref 4.22–5.81)
RDW: 13.1 % (ref 11.5–15.5)
WBC: 7.4 10*3/uL (ref 4.0–10.5)

## 2013-09-23 LAB — COMPREHENSIVE METABOLIC PANEL
Albumin: 3.6 g/dL (ref 3.5–5.2)
BUN: 11 mg/dL (ref 6–23)
CO2: 27 mEq/L (ref 19–32)
Calcium: 9.3 mg/dL (ref 8.4–10.5)
Chloride: 101 mEq/L (ref 96–112)
Creatinine, Ser: 0.84 mg/dL (ref 0.50–1.35)
GFR calc Af Amer: >90 mL/min (ref >90)
GFR calc non Af Amer: >90 mL/min (ref >90)
Glucose, Bld: 122 mg/dL — ABNORMAL HIGH (ref 70–99)
Total Bilirubin: 0.8 mg/dL (ref 0.3–1.2)

## 2013-09-23 LAB — PRO B NATRIURETIC PEPTIDE: Pro B Natriuretic peptide (BNP): 554.7 pg/mL — ABNORMAL HIGH (ref 0–125)

## 2013-09-23 LAB — LIPASE, BLOOD: Lipase: 85 U/L — ABNORMAL HIGH (ref 11–59)

## 2013-09-23 MED ORDER — ONDANSETRON HCL 4 MG PO TABS
4.0000 mg | ORAL_TABLET | Freq: Four times a day (QID) | ORAL | Status: DC | PRN
  Administered 2013-09-26: 4 mg via ORAL
  Filled 2013-09-23 (×2): qty 1

## 2013-09-23 MED ORDER — IBUPROFEN 800 MG PO TABS
800.0000 mg | ORAL_TABLET | Freq: Once | ORAL | Status: AC
  Administered 2013-09-23: 800 mg via ORAL

## 2013-09-23 MED ORDER — SODIUM CHLORIDE 0.9 % IV SOLN: INTRAVENOUS | Status: DC

## 2013-09-23 MED ORDER — GUAIFENESIN ER 600 MG PO TB12
1200.0000 mg | ORAL_TABLET | Freq: Two times a day (BID) | ORAL | Status: DC
  Administered 2013-09-24 – 2013-09-26 (×6): 1200 mg via ORAL
  Filled 2013-09-23 (×6): qty 2

## 2013-09-23 MED ORDER — IBUPROFEN 800 MG PO TABS
ORAL_TABLET | ORAL | Status: AC
  Filled 2013-09-23: qty 1

## 2013-09-23 MED ORDER — LEVOFLOXACIN 750 MG PO TABS
750.0000 mg | ORAL_TABLET | Freq: Every day | ORAL | Status: DC
  Administered 2013-09-24: 750 mg via ORAL
  Filled 2013-09-23: qty 1

## 2013-09-23 MED ORDER — CITALOPRAM HYDROBROMIDE 20 MG PO TABS: 40.0000 mg | ORAL_TABLET | Freq: Every day | ORAL | Status: DC

## 2013-09-23 MED ORDER — FUROSEMIDE 10 MG/ML IJ SOLN
20.0000 mg | Freq: Two times a day (BID) | INTRAMUSCULAR | Status: DC
  Administered 2013-09-24 – 2013-09-26 (×6): 20 mg via INTRAVENOUS
  Filled 2013-09-23 (×6): qty 2

## 2013-09-23 MED ORDER — ONDANSETRON HCL 4 MG/2ML IJ SOLN
4.0000 mg | Freq: Once | INTRAMUSCULAR | Status: AC
  Administered 2013-09-23: 4 mg via INTRAVENOUS
  Filled 2013-09-23: qty 2

## 2013-09-23 MED ORDER — SODIUM CHLORIDE 0.9 % IV SOLN: 250.0000 mL | INTRAVENOUS | Status: DC | PRN

## 2013-09-23 MED ORDER — HYDROMORPHONE HCL PF 1 MG/ML IJ SOLN
1.0000 mg | Freq: Once | INTRAMUSCULAR | Status: AC
  Administered 2013-09-23: 1 mg via INTRAVENOUS
  Filled 2013-09-23: qty 1

## 2013-09-23 MED ORDER — POTASSIUM CHLORIDE CRYS ER 20 MEQ PO TBCR
40.0000 meq | EXTENDED_RELEASE_TABLET | Freq: Once | ORAL | Status: AC
  Administered 2013-09-24: 40 meq via ORAL
  Filled 2013-09-23: qty 2

## 2013-09-23 MED ORDER — SODIUM CHLORIDE 0.9 % IV SOLN
INTRAVENOUS | Status: DC
  Administered 2013-09-23: 15:00:00 via INTRAVENOUS

## 2013-09-23 MED ORDER — ENOXAPARIN SODIUM 40 MG/0.4ML ~~LOC~~ SOLN
40.0000 mg | SUBCUTANEOUS | Status: DC
  Administered 2013-09-24 – 2013-09-26 (×3): 40 mg via SUBCUTANEOUS
  Filled 2013-09-23 (×3): qty 0.4

## 2013-09-23 MED ORDER — SODIUM CHLORIDE 0.9 % IJ SOLN
3.0000 mL | Freq: Two times a day (BID) | INTRAMUSCULAR | Status: DC
  Administered 2013-09-24 (×2): 3 mL via INTRAVENOUS

## 2013-09-23 MED ORDER — SODIUM CHLORIDE 0.9 % IV SOLN
1000.0000 mL | Freq: Once | INTRAVENOUS | Status: AC
  Administered 2013-09-23: 1000 mL via INTRAVENOUS

## 2013-09-23 MED ORDER — ONDANSETRON HCL 4 MG/2ML IJ SOLN
4.0000 mg | Freq: Four times a day (QID) | INTRAMUSCULAR | Status: DC | PRN
  Administered 2013-09-24 – 2013-09-25 (×3): 4 mg via INTRAVENOUS
  Filled 2013-09-23 (×3): qty 2

## 2013-09-23 MED ORDER — SODIUM CHLORIDE 0.9 % IJ SOLN: 3.0000 mL | INTRAMUSCULAR | Status: DC | PRN

## 2013-09-23 NOTE — Progress Notes (Signed)
Have paged Dr Sharl Ma several times about this new admission that arrive on floor a 1925. No response. Called ER dictation room about 30 minutes ago. Informed Dr Sharl Ma that patient had been waiting about 3 hours now for admitting orders. States he would be to floor after he admiitted the  Patient he was working on.

## 2013-09-23 NOTE — H&P (Signed)
PCP:   Colette Ribas, MD   Chief Complaint:  Fever  HPI: 37 year old male who was  discharged yesterday from the hospital after being treated for  pneumonia now came back to the hospital with fever and cough. Patient also had one episode of vomiting with nausea. Patient was discharged on Levaquin. Chest x-ray in the ED shows persistent right lower lobe consolidation which could represent atelectasis or pneumonia. Patient's white count is normal, but he continued to have fever at home with  T. max 102.9. He denies any other new complaints.  Allergies:   Allergies  Allergen Reactions  . Compazine [Prochlorperazine Edisylate]     Hallucination   . Amoxicillin Rash  . Penicillins Rash      Past Medical History  Diagnosis Date  . Meningitis   . Anxiety disorder     Past Surgical History  Procedure Laterality Date  . Knee arthroscopy    . Appendectomy      Prior to Admission medications   Medication Sig Start Date End Date Taking? Authorizing Provider  chlorpheniramine-HYDROcodone (TUSSIONEX) 10-8 MG/5ML LQCR Take 5 mLs by mouth every 12 (twelve) hours as needed for cough. 09/22/13  Yes Erick Blinks, MD  citalopram (CELEXA) 40 MG tablet Take 40 mg by mouth daily.   Yes Historical Provider, MD  guaiFENesin (MUCINEX) 600 MG 12 hr tablet Take 2 tablets (1,200 mg total) by mouth 2 (two) times daily. 09/22/13  Yes Erick Blinks, MD  levofloxacin (LEVAQUIN) 750 MG tablet Take 1 tablet (750 mg total) by mouth daily. 09/22/13  Yes Erick Blinks, MD    Social History:  reports that he has never smoked. He does not have any smokeless tobacco history on file. He reports that he does not drink alcohol or use illicit drugs.  History reviewed. No pertinent family history.   All the positives are listed in BOLD  Review of Systems:  HEENT: Headache, blurred vision, runny nose, sore throat Neck: Hypothyroidism, hyperthyroidism,,lymphadenopathy Chest : Shortness of breath,  history of COPD, Asthma, cough Heart : Chest pain, history of coronary arterey disease GI:  Nausea, vomiting, diarrhea, constipation, GERD GU: Dysuria, urgency, frequency of urination, hematuria Neuro: Stroke, seizures, syncope Psych: Depression, anxiety, hallucinations   Physical Exam: Blood pressure 116/61, pulse 77, temperature 100.4 F (38 C), temperature source Oral, resp. rate 18, height 5\' 9"  (1.753 m), weight 95.664 kg (210 lb 14.4 oz), SpO2 96.00%. Constitutional:   Patient is a well-developed and well-nourished  in no acute distress and cooperative with exam. Head: Normocephalic and atraumatic Mouth: Mucus membranes moist Eyes: PERRL, EOMI, conjunctivae normal Neck: Supple, No Thyromegaly Cardiovascular: RRR, S1 normal, S2 normal Pulmonary/Chest: Bibasilar crackles Abdominal: Soft. Non-tender, non-distended, bowel sounds are normal, no masses, organomegaly, or guarding present.  Neurological: A&O x3, Strenght is normal and symmetric bilaterally, cranial nerve II-XII are grossly intact, no focal motor deficit, sensory intact to light touch bilaterally.  Extremities : No Cyanosis, Clubbing or Edema   Labs on Admission:  Results for orders placed during the hospital encounter of 09/23/13 (from the past 48 hour(s))  COMPREHENSIVE METABOLIC PANEL     Status: Abnormal   Collection Time    09/23/13  2:38 PM      Result Value Range   Sodium 136  135 - 145 mEq/L   Potassium 3.5  3.5 - 5.1 mEq/L   Chloride 101  96 - 112 mEq/L   CO2 27  19 - 32 mEq/L   Glucose, Bld 122 (*) 70 -  99 mg/dL   BUN 11  6 - 23 mg/dL   Creatinine, Ser 8.11  0.50 - 1.35 mg/dL   Calcium 9.3  8.4 - 91.4 mg/dL   Total Protein 6.8  6.0 - 8.3 g/dL   Albumin 3.6  3.5 - 5.2 g/dL   AST 77 (*) 0 - 37 U/L   ALT 150 (*) 0 - 53 U/L   Alkaline Phosphatase 107  39 - 117 U/L   Total Bilirubin 0.8  0.3 - 1.2 mg/dL   GFR calc non Af Amer >90  >90 mL/min   GFR calc Af Amer >90  >90 mL/min   Comment: (NOTE)     The  eGFR has been calculated using the CKD EPI equation.     This calculation has not been validated in all clinical situations.     eGFR's persistently <90 mL/min signify possible Chronic Kidney     Disease.  LIPASE, BLOOD     Status: Abnormal   Collection Time    09/23/13  2:38 PM      Result Value Range   Lipase 85 (*) 11 - 59 U/L  CBC WITH DIFFERENTIAL     Status: Abnormal   Collection Time    09/23/13  2:38 PM      Result Value Range   WBC 7.4  4.0 - 10.5 K/uL   RBC 4.76  4.22 - 5.81 MIL/uL   Hemoglobin 14.0  13.0 - 17.0 g/dL   HCT 78.2  95.6 - 21.3 %   MCV 87.0  78.0 - 100.0 fL   MCH 29.4  26.0 - 34.0 pg   MCHC 33.8  30.0 - 36.0 g/dL   RDW 08.6  57.8 - 46.9 %   Platelets 222  150 - 400 K/uL   Neutrophils Relative % 85 (*) 43 - 77 %   Neutro Abs 6.3  1.7 - 7.7 K/uL   Lymphocytes Relative 9 (*) 12 - 46 %   Lymphs Abs 0.7  0.7 - 4.0 K/uL   Monocytes Relative 6  3 - 12 %   Monocytes Absolute 0.4  0.1 - 1.0 K/uL   Eosinophils Relative 0  0 - 5 %   Eosinophils Absolute 0.0  0.0 - 0.7 K/uL   Basophils Relative 0  0 - 1 %   Basophils Absolute 0.0  0.0 - 0.1 K/uL  PRO B NATRIURETIC PEPTIDE     Status: Abnormal   Collection Time    09/23/13  2:38 PM      Result Value Range   Pro B Natriuretic peptide (BNP) 554.7 (*) 0 - 125 pg/mL  CULTURE, BLOOD (ROUTINE X 2)     Status: None   Collection Time    09/23/13  3:27 PM      Result Value Range   Specimen Description       Value: BLOOD LEFT ANTECUBITAL BOTTLES DRAWN AEROBIC AND ANAEROBIC   Special Requests 10CC     Culture PENDING     Report Status PENDING    LACTIC ACID, PLASMA     Status: None   Collection Time    09/23/13  5:09 PM      Result Value Range   Lactic Acid, Venous 1.1  0.5 - 2.2 mmol/L    Radiological Exams on Admission: Dg Chest 2 View  09/23/2013   CLINICAL DATA:  Pneumonia on the right, fever  EXAM: CHEST  2 VIEW  COMPARISON:  09/21/2013  FINDINGS: Mild cardiac enlargement stable. Left lung  is clear.  Consolidation right lower lobe unchanged. Right diaphragm appears to be elevated. This is stable.  IMPRESSION: Unchanged right lower lobe consolidation which could represent atelectasis or pneumonia.   Electronically Signed   By: Esperanza Heir M.D.   On: 09/23/2013 16:16    Assessment/Plan Active Problems:   Community acquired pneumonia   Pleuritic chest pain  37 year old male being admitted with fever and cough. Chest x-ray shows persistent pneumonia so we'll continue him on Levaquin. He does have elevated BNP of 554.7 and chest x-ray showing cardiomegaly so we'll obtain 2-D echocardiogram. We'll start Lasix 20 mg IV every 12 hours for mild pulmonary edema clinically Also patient has mild elevation of lipase of 85. We'll keep him n.p.o. give Zofran for nausea vomiting and check lipase in the morning.  Code status: Presumed full code  Family discussion: Discussed with patient in detail, no family at bedside   Time Spent on Admission: 70 min  Ayyan Sites S [facility] Pager: [phone] 09/23/2013, 11:33 PM  If 7PM-7AM, please contact night-coverage  [email or web address]  Password [email or web address]

## 2013-09-23 NOTE — Progress Notes (Signed)
Dr. Lama in to see patient. 

## 2013-09-23 NOTE — ED Provider Notes (Signed)
CSN: [id number]     Arrival date & time 09/23/13  1413 History  This chart was scribed for Carlos Jakes, MD by Bennett Scrape, ED Scribe. This patient was seen in room APA04/APA04 and the patient's care was started at 3:00 PM.   Chief Complaint  Patient presents with  . Fever  . Pneumonia  . Pain    Patient is a 37 y.o. male presenting with fever. The history is provided by the patient. No language interpreter was used.  Fever Max temp prior to arrival:  102.7 Severity:  Moderate Duration:  1 day Timing:  Constant Associated symptoms: chills, cough, diarrhea (mild), nausea and vomiting   Associated symptoms: no chest pain, no confusion, no congestion, no dysuria, no headaches, no rash and no sore throat     HPI Comments: Carlos Stone is a 37 y.o. male with a recent PNA who presents to the Emergency Department complaining of return of fever with associated increased nausea, emesis and right mid back pain. Pt was seen for similar symptoms 2 days ago and was diagnosed with PNA on CXR. No CT scan was performed at the time. admitted for overnight PNA treatment 2 days ago and was discharged with Levaquin. Wife states that the pt woke up today complaining of "feeling worse" with a max temperature of 102.7, multiple episodes of emesis and increased nausea. Pt states that he has been experiencing persistent right posterior rib pain since admission that is worse with coughing today. He denies SOB or HA. He denies any recent travel or tick bites. He denies any industrial exposure. Wife admits to having several sick family members with similar symptoms at home.   Pt was evaluated by myself prior to admission 2 days ago and he appears clinically worse today.  Past Medical History  Diagnosis Date  . Meningitis   . Anxiety disorder    Past Surgical History  Procedure Laterality Date  . Knee arthroscopy    . Appendectomy     No family history on file. History  Substance Use Topics   . Smoking status: Never Smoker   . Smokeless tobacco: Not on file  . Alcohol Use: No    Review of Systems  Constitutional: Positive for fever and chills.  HENT: Negative for congestion and sore throat.   Eyes: Negative for visual disturbance.  Respiratory: Positive for cough. Negative for shortness of breath.   Cardiovascular: Negative for chest pain and leg swelling.  Gastrointestinal: Positive for nausea, vomiting and diarrhea (mild). Negative for abdominal pain.  Genitourinary: Negative for dysuria.  Musculoskeletal: Positive for back pain (lower). Negative for neck pain.  Skin: Negative for rash.  Neurological: Negative for dizziness and headaches.  Hematological: Does not bruise/bleed easily.  Psychiatric/Behavioral: Negative for confusion.  All other systems reviewed and are negative.    Allergies  Compazine; Amoxicillin; and Penicillins  Home Medications   Current Outpatient Rx  Name  Route  Sig  Dispense  Refill  . chlorpheniramine-HYDROcodone (TUSSIONEX) 10-8 MG/5ML LQCR   Oral   Take 5 mLs by mouth every 12 (twelve) hours as needed for cough.   115 mL   0   . citalopram (CELEXA) 40 MG tablet   Oral   Take 40 mg by mouth daily.         Marland Kitchen guaiFENesin (MUCINEX) 600 MG 12 hr tablet   Oral   Take 2 tablets (1,200 mg total) by mouth 2 (two) times daily.   20 tablet   0   .  levofloxacin (LEVAQUIN) 750 MG tablet   Oral   Take 1 tablet (750 mg total) by mouth daily.   6 tablet   0    Triage Vitals: BP 144/73  Pulse 95  Temp(Src) 102.7 F (39.3 C) (Oral)  Resp 24  Ht 5\' 9"  (1.753 m)  Wt 199 lb (90.266 kg)  BMI 29.37 kg/m2  SpO2 93%  Physical Exam  Nursing note and vitals reviewed. Constitutional: He is oriented to person, place, and time. He appears well-developed and well-nourished. No distress.  HENT:  Head: Normocephalic and atraumatic.  Mouth/Throat: Oropharynx is clear and moist.  Eyes: Conjunctivae and EOM are normal. Pupils are equal,  round, and reactive to light.  Sclera are clear  Neck: Neck supple. No tracheal deviation present.  Cardiovascular: Normal rate and regular rhythm.   No murmur heard. Pulses:      Dorsalis pedis pulses are 2+ on the right side, and 2+ on the left side.  Pulmonary/Chest: Effort normal and breath sounds normal. No respiratory distress. He has no wheezes.  Abdominal: Soft. Bowel sounds are normal. He exhibits no distension. There is no tenderness.  Musculoskeletal: Normal range of motion. He exhibits no edema (no ankle swelling).  Lymphadenopathy:    He has no cervical adenopathy.  Neurological: He is alert and oriented to person, place, and time. No cranial nerve deficit.  Pt able to move both sets of fingers and toes  Skin: Skin is warm and dry. No rash noted.  Warm to the touch  Psychiatric: He has a normal mood and affect. His behavior is normal.    ED Course  Procedures (including critical care time)  Medications  0.9 %  sodium chloride infusion ( Intravenous New Bag/Given 09/23/13 1525)  0.9 %  sodium chloride infusion (0 mLs Intravenous Stopped 09/23/13 1719)  ondansetron (ZOFRAN) injection 4 mg (4 mg Intravenous Given 09/23/13 1447)  HYDROmorphone (DILAUDID) injection 1 mg (1 mg Intravenous Given 09/23/13 1525)  ibuprofen (ADVIL,MOTRIN) tablet 800 mg (800 mg Oral Given 09/23/13 1535)    DIAGNOSTIC STUDIES: Oxygen Saturation is 95% on 2L [place], adequate by my interpretation.    COORDINATION OF CARE: 3:05 PM-Discussed treatment plan which includes antiemetic, CXR, CBC panel, CMP and UA with pt at bedside and pt agreed to plan.   Labs Review Labs Reviewed  COMPREHENSIVE METABOLIC PANEL - Abnormal; Notable for the following:    Glucose, Bld 122 (*)    AST 77 (*)    ALT 150 (*)    All other components within normal limits  LIPASE, BLOOD - Abnormal; Notable for the following:    Lipase 85 (*)    All other components within normal limits  CBC WITH DIFFERENTIAL - Abnormal;  Notable for the following:    Neutrophils Relative % 85 (*)    Lymphocytes Relative 9 (*)    All other components within normal limits  PRO B NATRIURETIC PEPTIDE - Abnormal; Notable for the following:    Pro B Natriuretic peptide (BNP) 554.7 (*)    All other components within normal limits  CULTURE, BLOOD (ROUTINE X 2)  CULTURE, BLOOD (ROUTINE X 2)  LACTIC ACID, PLASMA  URINALYSIS, ROUTINE W REFLEX MICROSCOPIC   Results for orders placed during the hospital encounter of 09/23/13  CULTURE, BLOOD (ROUTINE X 2)      Result Value Range   Specimen Description       Value: BLOOD LEFT ANTECUBITAL BOTTLES DRAWN AEROBIC AND ANAEROBIC   Special Requests 10CC  Culture PENDING     Report Status PENDING    COMPREHENSIVE METABOLIC PANEL      Result Value Range   Sodium 136  135 - 145 mEq/L   Potassium 3.5  3.5 - 5.1 mEq/L   Chloride 101  96 - 112 mEq/L   CO2 27  19 - 32 mEq/L   Glucose, Bld 122 (*) 70 - 99 mg/dL   BUN 11  6 - 23 mg/dL   Creatinine, Ser 3.08  0.50 - 1.35 mg/dL   Calcium 9.3  8.4 - 65.7 mg/dL   Total Protein 6.8  6.0 - 8.3 g/dL   Albumin 3.6  3.5 - 5.2 g/dL   AST 77 (*) 0 - 37 U/L   ALT 150 (*) 0 - 53 U/L   Alkaline Phosphatase 107  39 - 117 U/L   Total Bilirubin 0.8  0.3 - 1.2 mg/dL   GFR calc non Af Amer >90  >90 mL/min   GFR calc Af Amer >90  >90 mL/min  LIPASE, BLOOD      Result Value Range   Lipase 85 (*) 11 - 59 U/L  CBC WITH DIFFERENTIAL      Result Value Range   WBC 7.4  4.0 - 10.5 K/uL   RBC 4.76  4.22 - 5.81 MIL/uL   Hemoglobin 14.0  13.0 - 17.0 g/dL   HCT 84.6  96.2 - 95.2 %   MCV 87.0  78.0 - 100.0 fL   MCH 29.4  26.0 - 34.0 pg   MCHC 33.8  30.0 - 36.0 g/dL   RDW 84.1  32.4 - 40.1 %   Platelets 222  150 - 400 K/uL   Neutrophils Relative % 85 (*) 43 - 77 %   Neutro Abs 6.3  1.7 - 7.7 K/uL   Lymphocytes Relative 9 (*) 12 - 46 %   Lymphs Abs 0.7  0.7 - 4.0 K/uL   Monocytes Relative 6  3 - 12 %   Monocytes Absolute 0.4  0.1 - 1.0 K/uL    Eosinophils Relative 0  0 - 5 %   Eosinophils Absolute 0.0  0.0 - 0.7 K/uL   Basophils Relative 0  0 - 1 %   Basophils Absolute 0.0  0.0 - 0.1 K/uL  PRO B NATRIURETIC PEPTIDE      Result Value Range   Pro B Natriuretic peptide (BNP) 554.7 (*) 0 - 125 pg/mL  LACTIC ACID, PLASMA      Result Value Range   Lactic Acid, Venous 1.1  0.5 - 2.2 mmol/L    Imaging Review Dg Chest 2 View  09/23/2013   CLINICAL DATA:  Pneumonia on the right, fever  EXAM: CHEST  2 VIEW  COMPARISON:  09/21/2013  FINDINGS: Mild cardiac enlargement stable. Left lung is clear. Consolidation right lower lobe unchanged. Right diaphragm appears to be elevated. This is stable.  IMPRESSION: Unchanged right lower lobe consolidation which could represent atelectasis or pneumonia.   Electronically Signed   By: Esperanza Heir M.D.   On: 09/23/2013 16:16    EKG Interpretation   None       MDM   1. Hypoxia   2. Community acquired pneumonia    Patient with persistent shortness of breath and now an oxygen requirement. Was admitted a couple days ago for community-acquired pneumonia improved rapidly in the hospital was discharged home now feeling worse. With walking he desats also upon presentation his oxygen levels were in the upper 80s. Patient more comfortable  on 2 L. Chest x-ray without sniffing the worse findings compared to before his white count is improved lactic acid is normal not consistent with a septic picture. We will send testing for influenza. Patient was tested with a d-dimer on last admission which was normal discussed with the admitting team about concerns for PE clinically doesn't seem to fit they are not requesting CT angiogram patient will be admitted to MedSurg and continue his oxygen.    I personally performed the services described in this documentation, which was scribed in my presence. The recorded information has been reviewed and is accurate.     Carlos Jakes, MD 09/23/13 [phone]

## 2013-09-23 NOTE — ED Notes (Signed)
Pt  Was discharged yesterday from [facility] with dx of pneumonia. Pt arrived vomiting, fever of 102.7, and pain to right rib area. Last medicated with Tylenol at 1320

## 2013-09-24 DIAGNOSIS — R0902 Hypoxemia: Secondary | ICD-10-CM

## 2013-09-24 DIAGNOSIS — R071 Chest pain on breathing: Secondary | ICD-10-CM

## 2013-09-24 DIAGNOSIS — I517 Cardiomegaly: Secondary | ICD-10-CM

## 2013-09-24 LAB — COMPREHENSIVE METABOLIC PANEL
ALT: 113 U/L — ABNORMAL HIGH (ref 0–53)
AST: 40 U/L — ABNORMAL HIGH (ref 0–37)
Alkaline Phosphatase: 94 U/L (ref 39–117)
CO2: 28 mEq/L (ref 19–32)
Chloride: 103 mEq/L (ref 96–112)
Creatinine, Ser: 0.89 mg/dL (ref 0.50–1.35)
GFR calc non Af Amer: >90 mL/min (ref >90)
Glucose, Bld: 105 mg/dL — ABNORMAL HIGH (ref 70–99)
Potassium: 3.6 mEq/L (ref 3.5–5.1)
Total Bilirubin: 0.6 mg/dL (ref 0.3–1.2)

## 2013-09-24 LAB — CBC
Hemoglobin: 12.6 g/dL — ABNORMAL LOW (ref 13.0–17.0)
MCV: 87.6 fL (ref 78.0–100.0)
Platelets: 229 10*3/uL (ref 150–400)
RBC: 4.36 MIL/uL (ref 4.22–5.81)
RDW: 13.2 % (ref 11.5–15.5)
WBC: 4.2 10*3/uL (ref 4.0–10.5)

## 2013-09-24 LAB — URINALYSIS, ROUTINE W REFLEX MICROSCOPIC
Bilirubin Urine: NEGATIVE
Ketones, ur: NEGATIVE mg/dL
Leukocytes, UA: NEGATIVE
Nitrite: NEGATIVE
Urobilinogen, UA: 0.2 mg/dL (ref 0.0–1.0)
pH: 5.5 (ref 5.0–8.0)

## 2013-09-24 LAB — INFLUENZA PANEL BY PCR (TYPE A & B)
H1N1 flu by pcr: NEGATIVE — AB
Influenza B By PCR: NEGATIVE — AB

## 2013-09-24 MED ORDER — LEVOFLOXACIN IN D5W 750 MG/150ML IV SOLN
750.0000 mg | INTRAVENOUS | Status: DC
  Administered 2013-09-25 – 2013-09-26 (×2): 750 mg via INTRAVENOUS
  Filled 2013-09-24 (×2): qty 150

## 2013-09-24 MED ORDER — SODIUM CHLORIDE 0.9 % IV SOLN: 250.0000 mL | INTRAVENOUS | Status: DC | PRN

## 2013-09-24 MED ORDER — CITALOPRAM HYDROBROMIDE 20 MG PO TABS
40.0000 mg | ORAL_TABLET | Freq: Every day | ORAL | Status: DC
  Administered 2013-09-24 – 2013-09-25 (×3): 40 mg via ORAL
  Filled 2013-09-24 (×3): qty 2

## 2013-09-24 NOTE — Progress Notes (Signed)
Patient seen and examined.  Agree with note as above.  Patient continues to be short of breath.  He is on treatment for CAP. He was admitted with a fever spike and since then his temperatures have been low-grade. Continue antibiotics for now. Echocardiogram has been ordered since his BNP was mildly elevated. Will followup. Continues to wean down oxygen as tolerated  Carlos Stone

## 2013-09-24 NOTE — Care Management Note (Signed)
    Page 1 of 1   09/24/2013     3:52:32 PM   CARE MANAGEMENT NOTE 09/24/2013  Patient:  Carlos Stone, Carlos Stone   Account Number:  [id number]  Date Initiated:  09/24/2013  Documentation initiated by:  Rosemary Holms  Subjective/Objective Assessment:   Pt admitted from home where he lives with his wife and 3 children. Pt normally independent with ADL. No HH/DME anticipated     Action/Plan:   Anticipated DC Date:  09/25/2013   Anticipated DC Plan:  HOME/SELF CARE         Choice offered to / List presented to:             Status of service:  Completed, signed off Medicare Important Message given?   (If response is "NO", the following Medicare IM given date fields will be blank) Date Medicare IM given:   Date Additional Medicare IM given:    Discharge Disposition:    Per UR Regulation:    If discussed at Long Length of Stay Meetings, dates discussed:    Comments:  09/24/13 Rosemary Holms RN BSN CM

## 2013-09-24 NOTE — Progress Notes (Signed)
*  PRELIMINARY RESULTS* Echocardiogram 2D Echocardiogram has been performed.  Davielle Lingelbach 09/24/2013, 6:26 PM

## 2013-09-24 NOTE — Progress Notes (Signed)
UR completed.  Patient changed to inpatient status r/t failing op treatment.

## 2013-09-24 NOTE — Progress Notes (Signed)
[facility] PROGRESS NOTE  Carlos Stone ZOX:[id number] DOB: [birth year] DOA: 09/23/2013 PCP: Colette Ribas, MD  Assessment/Plan: Community acquired pneumonia: remains slightly febrile. Lactic acid trending downward. Hemodynamically stable. Oxygen saturation >90% on 2L.  No leukocytosis. Will continue Levaquin. Provide gentle IV hydration. Will check sats on room air with ambulation and wean oxygen as appropriate.    Pleuritic chest pain:likely related to coughing. Provide pain med. Robitussin for cough.   Elevated transaminase: likely related to acute illness. Trending downward. May benefit from OP follow up for trending.    Code Status: full Family Communication: none present Disposition Plan: home hopefully 24 hours   Consultants:  none  Procedures:  none  Antibiotics:  Levaquin 11.13.14>>  HPI/Subjective: Sitting on side of bed. Frequent moist non-productive cough.NAD  Objective: Filed Vitals:   09/24/13 0852  BP:   Pulse:   Temp: 99.9 F (37.7 C)  Resp:     Intake/Output Summary (Last 24 hours) at 09/24/13 0903 Last data filed at 09/24/13 0200  Gross per 24 hour  Intake      0 ml  Output   1000 ml  Net  -1000 ml   Filed Weights   09/23/13 1430 09/23/13 1926  Weight: 90.266 kg (199 lb) 95.664 kg (210 lb 14.4 oz)    Exam:   General:  Well nourished NAD  Cardiovascular: RRR No MGR No LE edema  Respiratory: normal effort BS diminished bilateral bases. Diffuse coarseness otherwise.   Abdomen: round soft +BS non-tender to palpation  Musculoskeletal: good muscle tone. No clubbing no cyanosis   Data Reviewed: Basic Metabolic Panel:  Recent Labs Lab 09/21/13 1438 09/22/13 0656 09/23/13 1438 09/24/13 0555  NA 138 140 136 139  K 3.2* 3.8 3.5 3.6  CL 99 108 101 103  CO2 28 24 27 28   GLUCOSE 153* 111* 122* 105*  BUN 14 13 11 10   CREATININE 0.96 0.87 0.84 0.89  CALCIUM 9.7 9.0 9.3 9.1   Liver Function Tests:  Recent Labs Lab  09/22/13 0656 09/23/13 1438 09/24/13 0555  AST 70* 77* 40*  ALT 88* 150* 113*  ALKPHOS 77 107 94  BILITOT 0.9 0.8 0.6  PROT 6.0 6.8 6.3  ALBUMIN 3.3* 3.6 3.4*    Recent Labs Lab 09/23/13 1438 09/24/13 0555  LIPASE 85* 38   No results found for this basename: AMMONIA,  in the last 168 hours CBC:  Recent Labs Lab 09/21/13 1438 09/22/13 0656 09/23/13 1438 09/24/13 0555  WBC 16.7* 9.2 7.4 4.2  NEUTROABS 12.6*  --  6.3  --   HGB 14.0 12.1* 14.0 12.6*  HCT 41.6 36.0* 41.4 38.2*  MCV 86.3 87.2 87.0 87.6  PLT 297 206 222 229   Cardiac Enzymes:  Recent Labs Lab 09/21/13 1438  TROPONINI <0.30   BNP (last 3 results)  Recent Labs  09/23/13 1438  PROBNP 554.7*   CBG: No results found for this basename: GLUCAP,  in the last 168 hours  Recent Results (from the past 240 hour(s))  CULTURE, BLOOD (ROUTINE X 2)     Status: None   Collection Time    09/21/13 10:31 PM      Result Value Range Status   Specimen Description LEFT ANTECUBITAL   Final   Special Requests BOTTLES DRAWN AEROBIC AND ANAEROBIC 12CC EACH   Final   Culture NO GROWTH 2 DAYS   Final   Report Status PENDING   Incomplete  CULTURE, BLOOD (ROUTINE X 2)     Status:  None   Collection Time    09/21/13 10:35 PM      Result Value Range Status   Specimen Description BLOOD RIGHT HAND   Final   Special Requests BOTTLES DRAWN AEROBIC AND ANAEROBIC 6CC EACH   Final   Culture NO GROWTH 2 DAYS   Final   Report Status PENDING   Incomplete  CULTURE, BLOOD (ROUTINE X 2)     Status: None   Collection Time    09/23/13  3:27 PM      Result Value Range Status   Specimen Description     Final   Value: BLOOD LEFT ANTECUBITAL BOTTLES DRAWN AEROBIC AND ANAEROBIC   Special Requests 10CC   Final   Culture PENDING   Incomplete   Report Status PENDING   Incomplete     Studies: Dg Chest 2 View  09/23/2013   CLINICAL DATA:  Pneumonia on the right, fever  EXAM: CHEST  2 VIEW  COMPARISON:  09/21/2013  FINDINGS: Mild  cardiac enlargement stable. Left lung is clear. Consolidation right lower lobe unchanged. Right diaphragm appears to be elevated. This is stable.  IMPRESSION: Unchanged right lower lobe consolidation which could represent atelectasis or pneumonia.   Electronically Signed   By: Esperanza Heir M.D.   On: 09/23/2013 16:16    Scheduled Meds: . citalopram  40 mg Oral Daily  . enoxaparin (LOVENOX) injection  40 mg Subcutaneous Q24H  . furosemide  20 mg Intravenous BID  . guaiFENesin  1,200 mg Oral BID  . levofloxacin  750 mg Oral Daily  . sodium chloride  3 mL Intravenous Q12H   Continuous Infusions:   Principal Problem:   Community acquired pneumonia Active Problems:   Hypokalemia   Pleuritic chest pain   Elevated transaminase level    Time spent: 30 minuted    [facility]  [facility] Pager [phone]. If 7PM-7AM, please contact night-coverage at [email or web address], password [facility] 09/24/2013, 9:03 AM  LOS: 1 day

## 2013-09-25 ENCOUNTER — Inpatient Hospital Stay (HOSPITAL_COMMUNITY): Payer: 59

## 2013-09-25 MED ORDER — IOHEXOL 300 MG/ML  SOLN
80.0000 mL | Freq: Once | INTRAMUSCULAR | Status: AC | PRN
  Administered 2013-09-25: 80 mL via INTRAVENOUS

## 2013-09-25 NOTE — Progress Notes (Signed)
[facility] PROGRESS NOTE  Carlos Stone WGN:[id number] DOB: [birth year] DOA: 09/23/2013 PCP: Colette Ribas, MD  Assessment/Plan: Community acquired pneumonia: remains slightly febrile. Hemodynamically stable. Oxygen saturation >90% on room air. No leukocytosis. Less coughing. Somewhat diaphoretic.  Will continue Levaquin day #2. Will check sats with ambulation   Elevated proBNP: echo with EF 55% and mild LVH.   Pleuritic chest pain:likely related to coughing. Somewhat improved. Echo done yields EF 55% and mild LVH.  Provide pain med. Robitussin for cough. Coughing improved  Elevated transaminase: likely related to acute illness. Trending downward. May benefit from OP follow up for trending.    Code Status: full Family Communication: wife at bedisde Disposition Plan: home maybe this afternoon but surely in am   Consultants:  none  Procedures:  Echo with EF 55% mild LVH.   Antibiotics: Levaquin 09/13/13>>> HPI/Subjective: Sitting up in bed. Reports feeling better but without stamina/energy.  Objective: Filed Vitals:   09/24/13 2319  BP: 107/66  Pulse: 52  Temp: 99.6 F (37.6 C)  Resp: 20    Intake/Output Summary (Last 24 hours) at 09/25/13 1201 Last data filed at 09/25/13 0650  Gross per 24 hour  Intake    480 ml  Output   2500 ml  Net  -2020 ml   Filed Weights   09/23/13 1430 09/23/13 1926  Weight: 90.266 kg (199 lb) 95.664 kg (210 lb 14.4 oz)    Exam:   General:  Comfortable NAD  Cardiovascular: RRR No MGR No LE edema  Respiratory: normal effort at rest. BS diminished in bilateral bases. Improved airflow otherwise. No wheeze  Abdomen: soft +BS non-tender to palpation  Musculoskeletal: no clubbing no cyanosis   Data Reviewed: Basic Metabolic Panel:  Recent Labs Lab 09/21/13 1438 09/22/13 0656 09/23/13 1438 09/24/13 0555  NA 138 140 136 139  K 3.2* 3.8 3.5 3.6  CL 99 108 101 103  CO2 28 24 27 28   GLUCOSE 153* 111* 122* 105*   BUN 14 13 11 10   CREATININE 0.96 0.87 0.84 0.89  CALCIUM 9.7 9.0 9.3 9.1   Liver Function Tests:  Recent Labs Lab 09/22/13 0656 09/23/13 1438 09/24/13 0555  AST 70* 77* 40*  ALT 88* 150* 113*  ALKPHOS 77 107 94  BILITOT 0.9 0.8 0.6  PROT 6.0 6.8 6.3  ALBUMIN 3.3* 3.6 3.4*    Recent Labs Lab 09/23/13 1438 09/24/13 0555  LIPASE 85* 38   No results found for this basename: AMMONIA,  in the last 168 hours CBC:  Recent Labs Lab 09/21/13 1438 09/22/13 0656 09/23/13 1438 09/24/13 0555  WBC 16.7* 9.2 7.4 4.2  NEUTROABS 12.6*  --  6.3  --   HGB 14.0 12.1* 14.0 12.6*  HCT 41.6 36.0* 41.4 38.2*  MCV 86.3 87.2 87.0 87.6  PLT 297 206 222 229   Cardiac Enzymes:  Recent Labs Lab 09/21/13 1438  TROPONINI <0.30   BNP (last 3 results)  Recent Labs  09/23/13 1438  PROBNP 554.7*   CBG: No results found for this basename: GLUCAP,  in the last 168 hours  Recent Results (from the past 240 hour(s))  CULTURE, BLOOD (ROUTINE X 2)     Status: None   Collection Time    09/21/13 10:31 PM      Result Value Range Status   Specimen Description BLOOD LEFT ANTECUBITAL   Final   Special Requests BOTTLES DRAWN AEROBIC AND ANAEROBIC 12CC EACH   Final   Culture NO GROWTH 4 DAYS  Final   Report Status PENDING   Incomplete  CULTURE, BLOOD (ROUTINE X 2)     Status: None   Collection Time    09/21/13 10:35 PM      Result Value Range Status   Specimen Description BLOOD RIGHT HAND   Final   Special Requests BOTTLES DRAWN AEROBIC AND ANAEROBIC 6CC EACH   Final   Culture NO GROWTH 4 DAYS   Final   Report Status PENDING   Incomplete  CULTURE, BLOOD (ROUTINE X 2)     Status: None   Collection Time    09/23/13  3:27 PM      Result Value Range Status   Specimen Description BLOOD LEFT ANTECUBITAL   Final   Special Requests BOTTLES DRAWN AEROBIC AND ANAEROBIC 10CC   Final   Culture NO GROWTH 2 DAYS   Final   Report Status PENDING   Incomplete  CULTURE, BLOOD (ROUTINE X 2)      Status: None   Collection Time    09/23/13  3:27 PM      Result Value Range Status   Specimen Description BLOOD LEFT HAND   Final   Special Requests BOTTLES DRAWN AEROBIC AND ANAEROBIC 10CC   Final   Culture NO GROWTH 2 DAYS   Final   Report Status PENDING   Incomplete     Studies: Dg Chest 2 View  09/23/2013   CLINICAL DATA:  Pneumonia on the right, fever  EXAM: CHEST  2 VIEW  COMPARISON:  09/21/2013  FINDINGS: Mild cardiac enlargement stable. Left lung is clear. Consolidation right lower lobe unchanged. Right diaphragm appears to be elevated. This is stable.  IMPRESSION: Unchanged right lower lobe consolidation which could represent atelectasis or pneumonia.   Electronically Signed   By: Esperanza Heir M.D.   On: 09/23/2013 16:16    Scheduled Meds: . citalopram  40 mg Oral Daily  . enoxaparin (LOVENOX) injection  40 mg Subcutaneous Q24H  . furosemide  20 mg Intravenous BID  . guaiFENesin  1,200 mg Oral BID  . levofloxacin (LEVAQUIN) IV  750 mg Intravenous Q24H  . sodium chloride  3 mL Intravenous Q12H   Continuous Infusions:   Principal Problem:   Community acquired pneumonia Active Problems:   Hypokalemia   Pleuritic chest pain   Elevated transaminase level    Time spent: 30 minutes    [facility] M  [facility] Pager [phone]. If 7PM-7AM, please contact night-coverage at [email or web address], password [facility] 09/25/2013, 12:01 PM  LOS: 2 days

## 2013-09-25 NOTE — Progress Notes (Signed)
Patient seen and examined, agree with as above per Toya Smothers, NP.  Patient appears to be slowly improving. He continues to have low-grade temperatures, but lab work is unremarkable. Clinically he does appear to be improving. CT of the chest and did not indicate any underlying pathology. We'll continue with current treatment for now. Flu PCR is also found to be negative. If the patient continues to improve, anticipate discharge in the next 24 hours   MEMON,JEHANZEB

## 2013-09-26 LAB — CULTURE, BLOOD (ROUTINE X 2)
Culture: NO GROWTH
Culture: NO GROWTH

## 2013-09-26 MED ORDER — LEVOFLOXACIN 750 MG PO TABS: 750.0000 mg | ORAL_TABLET | Freq: Every day | ORAL | Status: DC

## 2013-09-26 MED ORDER — ONDANSETRON HCL 4 MG PO TABS: 4.0000 mg | ORAL_TABLET | Freq: Four times a day (QID) | ORAL | Status: DC | PRN

## 2013-09-26 NOTE — Discharge Summary (Signed)
Physician Discharge Summary  Carlos Stone ZOX:[id number] DOB: 07-26-1976 DOA: 09/23/2013  PCP: Carlos Ribas, MD  Admit date: 09/23/2013 Discharge date: 09/26/2013  Recommendations for Outpatient Follow-up:  1. Followup resolution of pneumonia 2. Followup transaminitis dating back to 2011, consider outpatient evaluation, significance unclear 3. Followup 2-D echocardiogram results, borderline systolic and diastolic function, suspect clinically insignificant  Follow-up Information   Follow up with Carlos Ribas, MD In 1 week.   Specialty:  Family Medicine   Contact information:   [address] [place] [address] [phone]      Discharge Diagnoses:  1. Acute hypoxic respiratory failure 2. Community acquired pneumonia 3. Transaminitis long-standing  Discharge Condition: Improved Disposition: Home  Diet recommendation: Regular  Filed Weights   09/23/13 1430 09/23/13 1926  Weight: 90.266 kg (199 lb) 95.664 kg (210 lb 14.4 oz)    History of present illness:  37 year old man readmitted for community-acquired pneumonia.  Hospital Course:  [name] was admitted for treatment of pneumonia. He was continued on Levaquin with resolution of fever and hypoxia. He feels quite well and is now stable for discharge. Followup CT chest unremarkable. Other findings as below.  1. Acute hypoxic respiratory failure: Resolved. Secondary to pneumonia. 2. Community acquired pneumonia: Appears clinically resolved. No evidence of infiltrate on chest CT. 3. Transaminitis: Likely related to acute illness. Trending down. This was also seen 10/2010. Followup as an outpatient. Could consider hepatitis panel. May be secondary to fatty liver. 4. Followup 2-D echocardiogram results, borderline systolic and diastolic function. Asymptomatic.  Consultants:  none Procedures:  2-D echocardiogram: Left ventricular ejection fraction 50-55%. Normal wall motion. No  regional wall motion abnormalities. Borderline diastolic dysfunction. Antibiotics:  Levaquin 11.21.14>>  Discharge Instructions  Discharge Orders   Future Orders Complete By Expires   Activity as tolerated - No restrictions  As directed    Diet general  As directed    Discharge instructions  As directed    Comments:     Be sure to complete antibiotic Levaquin. Call physician or seek immediate medical assistance for fever, shortness of breath or worsening of condition.       Medication List    STOP taking these medications       chlorpheniramine-HYDROcodone 10-8 MG/5ML Lqcr  Commonly known as:  TUSSIONEX      TAKE these medications       citalopram 40 MG tablet  Commonly known as:  CELEXA  Take 40 mg by mouth daily.     guaiFENesin 600 MG 12 hr tablet  Commonly known as:  MUCINEX  Take 2 tablets (1,200 mg total) by mouth 2 (two) times daily.     levofloxacin 750 MG tablet  Commonly known as:  LEVAQUIN  Take 1 tablet (750 mg total) by mouth daily.     ondansetron 4 MG tablet  Commonly known as:  ZOFRAN  Take 1 tablet (4 mg total) by mouth every 6 (six) hours as needed for nausea.       Allergies  Allergen Reactions  . Compazine [Prochlorperazine Edisylate]     Hallucination   . Amoxicillin Rash  . Penicillins Rash    The results of significant diagnostics from this hospitalization (including imaging, microbiology, ancillary and laboratory) are listed below for reference.    Significant Diagnostic Studies: Dg Chest 2 View  09/23/2013   CLINICAL DATA:  Pneumonia on the right, fever  EXAM: CHEST  2 VIEW  COMPARISON:  09/21/2013  FINDINGS: Mild  cardiac enlargement stable. Left lung is clear. Consolidation right lower lobe unchanged. Right diaphragm appears to be elevated. This is stable.  IMPRESSION: Unchanged right lower lobe consolidation which could represent atelectasis or pneumonia.   Electronically Signed   By: Esperanza Heir M.D.   On: 09/23/2013 16:16    Ct Chest W Contrast  09/25/2013   CLINICAL DATA:  Cough.  Pneumonia.  Fever.  EXAM: CT CHEST WITH CONTRAST  TECHNIQUE: Multidetector CT imaging of the chest was performed during intravenous contrast administration.  CONTRAST:  80mL OMNIPAQUE IOHEXOL 300 MG/ML  SOLN  COMPARISON:  Chest radiograph, 09/23/2013  FINDINGS: There is a wedge-shaped area of lung in the anterior inferior right lower lobe that is most consistent with atelectasis. Milder areas of dependent reticular opacity are also noted in the right lower lobe consistent with subsegmental atelectasis. There is also dependent atelectasis in the posterior left lower lobe. There is no convincing infiltrate.  The lungs are otherwise clear. The larger airways are widely patent.  The heart is normal size and configuration. Normal great vessels, mediastinum and hila.  Limited evaluation of the upper abdomen is unremarkable.  IMPRESSION: 1. Opacity in the lung bases, most evident along the anterior inferior right lower lobe, is most consistent with atelectasis no convincing infiltrate. No airway obstruction is seen. 2. No other abnormalities.   Electronically Signed   By: Amie Portland M.D.   On: 09/25/2013 15:40    Microbiology: Recent Results (from the past 240 hour(s))  CULTURE, BLOOD (ROUTINE X 2)     Status: None   Collection Time    09/21/13 10:31 PM      Result Value Range Status   Specimen Description BLOOD LEFT ANTECUBITAL   Final   Special Requests BOTTLES DRAWN AEROBIC AND ANAEROBIC 12CC EACH   Final   Culture NO GROWTH 5 DAYS   Final   Report Status 09/26/2013 FINAL   Final  CULTURE, BLOOD (ROUTINE X 2)     Status: None   Collection Time    09/21/13 10:35 PM      Result Value Range Status   Specimen Description BLOOD RIGHT HAND   Final   Special Requests BOTTLES DRAWN AEROBIC AND ANAEROBIC 6CC EACH   Final   Culture NO GROWTH 5 DAYS   Final   Report Status 09/26/2013 FINAL   Final  CULTURE, BLOOD (ROUTINE X 2)     Status: None    Collection Time    09/23/13  3:27 PM      Result Value Range Status   Specimen Description BLOOD LEFT ANTECUBITAL   Final   Special Requests BOTTLES DRAWN AEROBIC AND ANAEROBIC 10CC   Final   Culture NO GROWTH 3 DAYS   Final   Report Status PENDING   Incomplete  CULTURE, BLOOD (ROUTINE X 2)     Status: None   Collection Time    09/23/13  3:27 PM      Result Value Range Status   Specimen Description BLOOD LEFT HAND   Final   Special Requests BOTTLES DRAWN AEROBIC AND ANAEROBIC 10CC   Final   Culture NO GROWTH 3 DAYS   Final   Report Status PENDING   Incomplete     Labs: Basic Metabolic Panel:  Recent Labs Lab 09/21/13 1438 09/22/13 0656 09/23/13 1438 09/24/13 0555  NA 138 140 136 139  K 3.2* 3.8 3.5 3.6  CL 99 108 101 103  CO2 28 24 27 28   GLUCOSE 153* 111*  122* 105*  BUN 14 13 11 10   CREATININE 0.96 0.87 0.84 0.89  CALCIUM 9.7 9.0 9.3 9.1   Liver Function Tests:  Recent Labs Lab 09/22/13 0656 09/23/13 1438 09/24/13 0555  AST 70* 77* 40*  ALT 88* 150* 113*  ALKPHOS 77 107 94  BILITOT 0.9 0.8 0.6  PROT 6.0 6.8 6.3  ALBUMIN 3.3* 3.6 3.4*    Recent Labs Lab 09/23/13 1438 09/24/13 0555  LIPASE 85* 38   CBC:  Recent Labs Lab 09/21/13 1438 09/22/13 0656 09/23/13 1438 09/24/13 0555  WBC 16.7* 9.2 7.4 4.2  NEUTROABS 12.6*  --  6.3  --   HGB 14.0 12.1* 14.0 12.6*  HCT 41.6 36.0* 41.4 38.2*  MCV 86.3 87.2 87.0 87.6  PLT 297 206 222 229   Cardiac Enzymes:  Recent Labs Lab 09/21/13 1438  TROPONINI <0.30     Recent Labs  09/23/13 1438  PROBNP 554.7*    Principal Problem:   Community acquired pneumonia Active Problems:   Hypokalemia   Pleuritic chest pain   Elevated transaminase level   Time coordinating discharge: 35 minutes  Signed:  Brendia Sacks, MD [facility] 09/26/2013, 6:18 PM

## 2013-09-26 NOTE — Progress Notes (Signed)
[facility] PROGRESS NOTE  CORBEN AUZENNE ZOX:[id number] DOB: 07/18/1976 DOA: 09/23/2013 PCP: Colette Ribas, MD  Assessment/Plan: 1. Acute hypoxic respiratory failure: Resolved. Secondary to pneumonia. 2. Community acquired pneumonia: Appears clinically resolved. No evidence of infiltrate on chest CT. 3. Transaminitis: Likely related to acute illness. Trending down. This was also seen 10/2010 4. Followup 2-D echocardiogram results, borderline diastolic dysfunction   Home today. Patient will complete Levaquin course he was prescribed on previous discharge.  Work note  Pending studies:   Blood cultures  Code Status: full code Family Communication:  Disposition Plan: Home  Brendia Sacks, MD  [facility]  Pager [phone] If 7PM-7AM, please contact night-coverage at [email or web address], password [facility] 09/26/2013, 5:20 PM  LOS: 3 days   Summary: 37 year old man readmitted for community-acquired pneumonia.  Consultants:  none Procedures:  2-D echocardiogram: Left ventricular ejection fraction 50-55%. Normal wall motion. No regional wall motion abnormalities. Borderline diastolic dysfunction. Antibiotics:  Levaquin 11.21.14>>  HPI/Subjective: Feels very good. Wants to go home. Breathing well. No new issues.  Objective: Filed Vitals:   09/25/13 1230 09/25/13 2255 09/26/13 0419 09/26/13 1545  BP: 103/64 116/63 114/74 120/76  Pulse: 66 57 58 63  Temp: 99 F (37.2 C) 98.6 F (37 C) 98.1 F (36.7 C) 98.3 F (36.8 C)  TempSrc: Oral Oral Oral   Resp: 20 20 20 20   Height:      Weight:      SpO2: 97% 97% 98% 97%    Intake/Output Summary (Last 24 hours) at 09/26/13 1720 Last data filed at 09/26/13 1229  Gross per 24 hour  Intake    720 ml  Output   1450 ml  Net   -730 ml     Filed Weights   09/23/13 1430 09/23/13 1926  Weight: 90.266 kg (199 lb) 95.664 kg (210 lb 14.4 oz)    Exam:   Afebrile, vital signs stable. No hypoxia.  General: Appears  calm and comfortable. Speech fluent and clear. Well-appearing.  Cardiovascular: Regular rate and rhythm no murmur, rub or gallop.   Respiratory: Clear to auscultation bilaterally. No wheezes, rales or rhonchi. Normal respiratory effort. Excellent air movement.  Data Reviewed:  No laboratory studies today  CT chest without convincing evidence of infiltrate  Scheduled Meds: . citalopram  40 mg Oral Daily  . enoxaparin (LOVENOX) injection  40 mg Subcutaneous Q24H  . furosemide  20 mg Intravenous BID  . guaiFENesin  1,200 mg Oral BID  . [START ON 09/27/2013] levofloxacin  750 mg Oral Daily  . sodium chloride  3 mL Intravenous Q12H   Continuous Infusions:   Principal Problem:   Community acquired pneumonia Active Problems:   Hypokalemia   Pleuritic chest pain   Elevated transaminase level

## 2013-09-26 NOTE — Progress Notes (Signed)
PHARMACIST - PHYSICIAN COMMUNICATION DR:   Memon CONCERNING: Antibiotic IV to Oral Route Change Policy  RECOMMENDATION: This patient is receiving Levaquin by the intravenous route.  Based on criteria approved by the Pharmacy and Therapeutics Committee, the antibiotic(s) is/are being converted to the equivalent oral dose form(s).   DESCRIPTION: These criteria include:  Patient being treated for a respiratory tract infection, urinary tract infection, cellulitis or clostridium difficile associated diarrhea if on metronidazole  The patient is not neutropenic and does not exhibit a GI malabsorption state  The patient is eating (either orally or via tube) and/or has been taking other orally administered medications for a least 24 hours  The patient is improving clinically and has a Tmax < 100.5  If you have questions about this conversion, please contact the Pharmacy Department  [x]  ( [phone] )  [facility] []  ( [phone]    []  ( [phone] )  [facility] []  ( [phone] )  [facility]    S. Nayeliz Hipp, PharmD 

## 2013-09-26 NOTE — Progress Notes (Signed)
Pt's IV removed.  Site WNL.  AVS reviewed with patient.  Prescription and work note provided to patient. Patient verbalized understanding of discharge instructions.  Patient reports all belongings intact and in possession at time of discharge.  Patient transported by NT via w/c to main entrance for discharge.  Patient stable at time of discharge.

## 2013-09-28 LAB — CULTURE, BLOOD (ROUTINE X 2): Culture: NO GROWTH

## 2013-10-12 ENCOUNTER — Other Ambulatory Visit (HOSPITAL_COMMUNITY): Payer: Self-pay | Admitting: Family Medicine

## 2013-10-12 ENCOUNTER — Ambulatory Visit (HOSPITAL_COMMUNITY)
Admission: RE | Admit: 2013-10-12 | Discharge: 2013-10-12 | Disposition: A | Discharge status: Home / Self Care | Payer: 59 | Source: Ambulatory Visit | Attending: Family Medicine | Admitting: Family Medicine

## 2013-10-12 DIAGNOSIS — Z09 Encounter for follow-up examination after completed treatment for conditions other than malignant neoplasm: Secondary | ICD-10-CM | POA: Insufficient documentation

## 2013-10-12 DIAGNOSIS — J189 Pneumonia, unspecified organism: Secondary | ICD-10-CM

## 2014-06-20 ENCOUNTER — Emergency Department (HOSPITAL_COMMUNITY): Payer: 59

## 2014-06-20 ENCOUNTER — Emergency Department (HOSPITAL_COMMUNITY)
Admission: EM | Admit: 2014-06-20 | Discharge: 2014-06-20 | Disposition: A | Discharge status: Home / Self Care | Payer: 59 | Attending: Emergency Medicine | Admitting: Emergency Medicine

## 2014-06-20 ENCOUNTER — Encounter (HOSPITAL_COMMUNITY): Payer: Self-pay | Admitting: Emergency Medicine

## 2014-06-20 DIAGNOSIS — R0789 Other chest pain: Secondary | ICD-10-CM | POA: Diagnosis not present

## 2014-06-20 DIAGNOSIS — Z880 Allergy status to penicillin: Secondary | ICD-10-CM | POA: Diagnosis not present

## 2014-06-20 DIAGNOSIS — Z792 Long term (current) use of antibiotics: Secondary | ICD-10-CM | POA: Insufficient documentation

## 2014-06-20 DIAGNOSIS — Z8709 Personal history of other diseases of the respiratory system: Secondary | ICD-10-CM | POA: Diagnosis not present

## 2014-06-20 DIAGNOSIS — R55 Syncope and collapse: Secondary | ICD-10-CM | POA: Diagnosis present

## 2014-06-20 DIAGNOSIS — Z8669 Personal history of other diseases of the nervous system and sense organs: Secondary | ICD-10-CM | POA: Diagnosis not present

## 2014-06-20 DIAGNOSIS — Z79899 Other long term (current) drug therapy: Secondary | ICD-10-CM | POA: Diagnosis not present

## 2014-06-20 DIAGNOSIS — Z8701 Personal history of pneumonia (recurrent): Secondary | ICD-10-CM | POA: Insufficient documentation

## 2014-06-20 DIAGNOSIS — F411 Generalized anxiety disorder: Secondary | ICD-10-CM | POA: Insufficient documentation

## 2014-06-20 DIAGNOSIS — Z8619 Personal history of other infectious and parasitic diseases: Secondary | ICD-10-CM | POA: Diagnosis not present

## 2014-06-20 LAB — BASIC METABOLIC PANEL
Anion gap: 11 (ref 5–15)
BUN: 14 mg/dL (ref 6–23)
CO2: 27 mEq/L (ref 19–32)
Calcium: 9.7 mg/dL (ref 8.4–10.5)
Chloride: 101 mEq/L (ref 96–112)
Creatinine, Ser: 0.97 mg/dL (ref 0.50–1.35)
GFR calc Af Amer: >90 mL/min (ref >90)
GFR calc non Af Amer: >90 mL/min (ref >90)
Glucose, Bld: 108 mg/dL — ABNORMAL HIGH (ref 70–99)
Potassium: 4.2 mEq/L (ref 3.7–5.3)
Sodium: 139 mEq/L (ref 137–147)

## 2014-06-20 LAB — CBC
HEMATOCRIT: 44.3 % (ref 39.0–52.0)
Hemoglobin: 14.9 g/dL (ref 13.0–17.0)
MCH: 28.5 pg (ref 26.0–34.0)
MCHC: 33.6 g/dL (ref 30.0–36.0)
MCV: 84.7 fL (ref 78.0–100.0)
Platelets: 241 10*3/uL (ref 150–400)
RBC: 5.23 MIL/uL (ref 4.22–5.81)
RDW: 13 % (ref 11.5–15.5)
WBC: 5.9 10*3/uL (ref 4.0–10.5)

## 2014-06-20 LAB — I-STAT TROPONIN, ED: TROPONIN I, POC: 0 ng/mL (ref 0.00–0.08)

## 2014-06-20 MED ORDER — ASPIRIN 81 MG PO CHEW
324.0000 mg | CHEWABLE_TABLET | Freq: Once | ORAL | Status: AC
  Administered 2014-06-20: 324 mg via ORAL
  Filled 2014-06-20: qty 4

## 2014-06-20 NOTE — ED Provider Notes (Signed)
CSN: [id number]     Arrival date & time 06/20/14  0845 History   First MD Initiated Contact with Patient 06/20/14 [phone]     Chief Complaint  Patient presents with  . Near Syncope     (Consider location/radiation/quality/duration/timing/severity/associated sxs/prior Treatment) HPI  Carlos Stone Is a 38 year old male with a past medical history of meningitis, respiratory failure secondary to pneumonia, anxiety, and C. difficile colitis who presents to the emergency department with chief complaint of chest pain and  Presyncope. 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 Denies radiation, tearing or ripping pain.  Patient states he was standing quietly taking in order at the time. patient states soon after a second episode occurred and again lasted for just a few seconds. Upon leaving the patient sat in his car and began feeling his heart racing. He had a sudden sensation of lower back pain, diaphoresis and cold hands. This episode only lasted a few seconds as well. He denies any nausea, vomiting, pain in the left jaw or shoulder. Denies urinary sxs or hx of urolithiasis. He does have a history of intermittent reflux for which he takes over-the-counter medications as needed. The patient denies history of smoking, hypertension, high cholesterol, diabetes, or family history of early MI.  Patient denies any cough or shortness of breath.  Past Medical History  Diagnosis Date  . Meningitis   . Anxiety disorder    Past Surgical History  Procedure Laterality Date  . Knee arthroscopy    . Appendectomy     History reviewed. No pertinent family history. History  Substance Use Topics  . Smoking status: Never Smoker   . Smokeless tobacco: Not on file  . Alcohol Use: No    Review of Systems  Ten systems reviewed and are negative  for acute change, except as noted in the HPI. \   Allergies  Compazine; Amoxicillin; and Penicillins  Home Medications   Prior to Admission medications   Medication Sig Start Date End Date Taking? Authorizing Provider  citalopram (CELEXA) 40 MG tablet Take 40 mg by mouth daily.    Historical Provider, MD  guaiFENesin (MUCINEX) 600 MG 12 hr tablet Take 2 tablets (1,200 mg total) by mouth 2 (two) times daily. 09/22/13   Kathie Dike, MD  levofloxacin (LEVAQUIN) 750 MG tablet Take 1 tablet (750 mg total) by mouth daily. 09/22/13   Kathie Dike, MD  ondansetron (ZOFRAN) 4 MG tablet Take 1 tablet (4 mg total) by mouth every 6 (six) hours as needed for nausea. 09/26/13   Samuella Cota, MD   BP 143/92  Pulse 69  Temp(Src) 98.5 F (36.9 C) (Oral)  Resp 20  Ht 5\' 9"  (1.753 m)  Wt 200 lb (90.719 kg)  BMI 29.52 kg/m2  SpO2 98% Physical Exam  Nursing note and vitals reviewed. Constitutional: He appears well-developed and well-nourished. No distress.  HENT:  Head: Normocephalic and atraumatic.  Eyes: Conjunctivae are normal. No scleral icterus.  Neck: Normal range of motion. Neck supple.  Cardiovascular: Normal rate, regular rhythm and normal heart sounds.   Pulmonary/Chest: Effort normal and breath sounds normal. No respiratory distress.  Abdominal: Soft. There is no tenderness.  Musculoskeletal: He exhibits no edema.  Neurological: He is alert.  Skin: Skin is warm and dry. He is not diaphoretic.  Psychiatric: His behavior is normal.  ED Course  Procedures (including critical care time) Labs Review Labs Reviewed  [place], ED    Imaging Review No results found.   EKG Interpretation None       ECG interpretation   Date: 06/20/2014  Rate: 69  Rhythm: normal sinus rhythm  QRS Axis: normal  Intervals: normal  ST/T Wave abnormalities: normal  Conduction Disutrbances: none  Narrative Interpretation:   Old EKG Reviewed: No  significant changes noted    MDM   Final diagnoses:  None   10:29 AM BP 125/78  Pulse 57  Temp(Src) 98.5 F (36.9 C) (Oral)  Resp 19  Ht 5\' 9"  (1.753 m)  Wt 200 lb (90.719 kg)  BMI 29.52 kg/m2  SpO2 97%  Patient with cp/ presyncope. Doubt ACS. Perc/wells negative More likely arrhythmia/gerd/ anxiety. HEART score 1 (overweight). No recurrence of events. No active cp   Filed Vitals:   06/20/14 0915 06/20/14 0930 06/20/14 0945 06/20/14 1015  BP: 122/77 118/81 115/87 125/78  Pulse: 77 60 64 57  Temp:      TempSrc:      Resp: 19 15 19    Height:      Weight:      SpO2: 98% 97% 97% 97%    Negative troponin. EKG is normal. Mild abnormality on cxr is, i believe, unrelated to today's sxs. He denies, fevers, cough, myslagias or other signs of infection. i have discussed the findings with the patient and advise f/u with pcp for any respiratory sxs and for possible arrhythmia work up.  Case discussed with Dr. Alvino Chapel who agrees with POC.   Margarita Mail, PA-C 06/21/14 [phone]

## 2014-06-20 NOTE — ED Notes (Signed)
Per pt sts he was at work and began to have a burning sensation in his chest and almost passed out. Denies doing anything strenuous. Denies any pain now sts he feels a little weak.

## 2014-06-20 NOTE — Discharge Instructions (Signed)
Your caregiver has diagnosed you as having chest pain that is not specific for one problem, but does not require admission.  You are at low risk for an acute heart condition or other serious illness. Chest pain comes from many different causes.  SEEK IMMEDIATE MEDICAL ATTENTION IF: You have severe chest pain, especially if the pain is crushing or pressure-like and spreads to the arms, back, neck, or jaw, or if you have sweating, nausea (feeling sick to your stomach), or shortness of breath. THIS IS AN EMERGENCY. Don't wait to see if the pain will go away. Get medical help at once. Call 911 or 0 (operator). DO NOT drive yourself to the hospital.  Your chest pain gets worse and does not go away with rest.  You have an attack of chest pain lasting longer than usual, despite rest and treatment with the medications your caregiver has prescribed.  You wake from sleep with chest pain or shortness of breath.  You feel dizzy or faint.  You have chest pain not typical of your usual pain for which you originally saw your caregiver.   Chest Pain (Nonspecific) It is often hard to give a specific diagnosis for the cause of chest pain. There is always a chance that your pain could be related to something serious, such as a heart attack or a blood clot in the lungs. You need to follow up with your health care provider for further evaluation. CAUSES   Heartburn.  Pneumonia or bronchitis.  Anxiety or stress.  Inflammation around your heart (pericarditis) or lung (pleuritis or pleurisy).  A blood clot in the lung.  A collapsed lung (pneumothorax). It can develop suddenly on its own (spontaneous pneumothorax) or from trauma to the chest.  Shingles infection (herpes zoster virus). The chest wall is composed of bones, muscles, and cartilage. Any of these can be the source of the pain.  The bones can be bruised by injury.  The muscles or cartilage can be strained by coughing or overwork.  The cartilage can  be affected by inflammation and become sore (costochondritis). DIAGNOSIS  Lab tests or other studies may be needed to find the cause of your pain. Your health care provider may have you take a test called an ambulatory electrocardiogram (ECG). An ECG records your heartbeat patterns over a 24-hour period. You may also have other tests, such as:  Transthoracic echocardiogram (TTE). During echocardiography, sound waves are used to evaluate how blood flows through your heart.  Transesophageal echocardiogram (TEE).  Cardiac monitoring. This allows your health care provider to monitor your heart rate and rhythm in real time.  Holter monitor. This is a portable device that records your heartbeat and can help diagnose heart arrhythmias. It allows your health care provider to track your heart activity for several days, if needed.  Stress tests by exercise or by giving medicine that makes the heart beat faster. TREATMENT   Treatment depends on what may be causing your chest pain. Treatment may include:  Acid blockers for heartburn.  Anti-inflammatory medicine.  Pain medicine for inflammatory conditions.  Antibiotics if an infection is present.  You may be advised to change lifestyle habits. This includes stopping smoking and avoiding alcohol, caffeine, and chocolate.  You may be advised to keep your head raised (elevated) when sleeping. This reduces the chance of acid going backward from your stomach into your esophagus. Most of the time, nonspecific chest pain will improve within 2-3 days with rest and mild pain medicine.  HOME  CARE INSTRUCTIONS   If antibiotics were prescribed, take them as directed. Finish them even if you start to feel better.  For the next few days, avoid physical activities that bring on chest pain. Continue physical activities as directed.  Do not use any tobacco products, including cigarettes, chewing tobacco, or electronic cigarettes.  Avoid drinking  alcohol.  Only take medicine as directed by your health care provider.  Follow your health care provider's suggestions for further testing if your chest pain does not go away.  Keep any follow-up appointments you made. If you do not go to an appointment, you could develop lasting (chronic) problems with pain. If there is any problem keeping an appointment, call to reschedule. SEEK MEDICAL CARE IF:   Your chest pain does not go away, even after treatment.  You have a rash with blisters on your chest.  You have a fever. SEEK IMMEDIATE MEDICAL CARE IF:   You have increased chest pain or pain that spreads to your arm, neck, jaw, back, or abdomen.  You have shortness of breath.  You have an increasing cough, or you cough up blood.  You have severe back or abdominal pain.  You feel nauseous or vomit.  You have severe weakness.  You faint.  You have chills. This is an emergency. Do not wait to see if the pain will go away. Get medical help at once. Call your local emergency services (911 in [place] Do not drive yourself to the hospital. MAKE SURE YOU:   Understand these instructions.  Will watch your condition.  Will get help right away if you are not doing well or get worse. Document Released: 07/28/2005 Document Revised: 10/23/2013 Document Reviewed: 05/23/2008 [facility] Patient Information 2015 [place], [place]. This information is not intended to replace advice given to you by your health care provider. Make sure you discuss any questions you have with your health care provider.  Holter Monitoring A Holter monitor is a small device with electrodes (small sticky patches) that attach to your chest. It records the electrical activity of your heart and is worn continuously for 24-48 hours.  A HOLTER MONITOR IS USED TO  Detect heart problems such as:  Heart arrhythmia. Is an abnormal or irregular heartbeat. With some heart arrhythmias, you may not feel or know that you have an  irregular heart rhythm.  Palpitations, such as feeling your heart racing or fluttering. It is possible to have heart palpitations and not have a heart arrhythmia.  A heart rhythm that is too slow or too fast.  If you have problems fainting, near fainting or feeling light-headed, a Holter monitor may be worn to see if your heart is the cause. HOLTER MONITOR PREPARATION   Electrodes will be attached to the skin on your chest.  If you have hair on your chest, small areas may have to be shaved. This is done to help the patches stick better and make the recording more accurate.  The electrodes are attached by wires to the Holter monitor. The Holter monitor clips to your clothing. You will wear the monitor at all times, even while exercising and sleeping. HOME CARE INSTRUCTIONS   Wear your monitor at all times.  The wires and the monitor must stay dry. Do not get the monitor wet.  Do not bathe, swim or use a hot tub with it on.  You may do a "sponge" bath while you have the monitor on.  Keep your skin clean, do not put body lotion or  moisturizer on your chest.  It's possible that your skin under the electrodes could become irritated. To keep this from happening, you may put the electrodes in slightly different places on your chest.  Your caregiver will also ask you to keep a diary of your activities, such as walking or doing chores. Be sure to note what you are doing if you experience heart symptoms such as palpitations. This will help your caregiver determine what might be contributing to your symptoms. The information stored in your monitor will be reviewed by your caregiver alongside your diary entries.  Make sure the monitor is safely clipped to your clothing or in a location close to your body that your caregiver recommends.  The monitor and electrodes are removed when the test is over. Return the monitor as directed.  Be sure to follow up with your caregiver and discuss your Holter  monitor results. SEEK IMMEDIATE MEDICAL CARE IF:  You faint or feel lightheaded.  You have trouble breathing.  You get pain in your chest, upper arm or jaw.  You feel sick to your stomach and your skin is pale, cool, or damp.  You think something is wrong with the way your heart is beating. MAKE SURE YOU:   Understand these instructions.  Will watch your condition.  Will get help right away if you are not doing well or get worse. Document Released: 07/16/2004 Document Revised: 01/10/2012 Document Reviewed: 11/28/2008 [facility] Patient Information 2015 [place], [place]. This information is not intended to replace advice given to you by your health care provider. Make sure you discuss any questions you have with your health care provider.

## 2014-06-22 NOTE — ED Provider Notes (Signed)
Medical screening examination/treatment/procedure(s) were performed by non-physician practitioner and as supervising physician I was immediately available for consultation/collaboration.   EKG Interpretation None       Jasper Riling. Alvino Chapel, MD 06/22/14 [phone]

## 2015-12-05 DIAGNOSIS — F419 Anxiety disorder, unspecified: Secondary | ICD-10-CM | POA: Diagnosis not present

## 2015-12-05 DIAGNOSIS — Z6834 Body mass index (BMI) 34.0-34.9, adult: Secondary | ICD-10-CM | POA: Diagnosis not present

## 2015-12-05 DIAGNOSIS — Z1389 Encounter for screening for other disorder: Secondary | ICD-10-CM | POA: Diagnosis not present

## 2015-12-05 DIAGNOSIS — E6609 Other obesity due to excess calories: Secondary | ICD-10-CM | POA: Diagnosis not present

## 2015-12-31 DIAGNOSIS — H6592 Unspecified nonsuppurative otitis media, left ear: Secondary | ICD-10-CM | POA: Diagnosis not present

## 2015-12-31 DIAGNOSIS — Z6834 Body mass index (BMI) 34.0-34.9, adult: Secondary | ICD-10-CM | POA: Diagnosis not present

## 2015-12-31 DIAGNOSIS — E6609 Other obesity due to excess calories: Secondary | ICD-10-CM | POA: Diagnosis not present

## 2015-12-31 DIAGNOSIS — Z1389 Encounter for screening for other disorder: Secondary | ICD-10-CM | POA: Diagnosis not present

## 2015-12-31 MED FILL — predniSONE 5 MG TABS: 5 | 12 days supply | Qty: 42 | Fill #0

## 2016-01-07 DIAGNOSIS — D2312 Other benign neoplasm of skin of left eyelid, including canthus: Secondary | ICD-10-CM | POA: Diagnosis not present

## 2016-01-07 DIAGNOSIS — H5211 Myopia, right eye: Secondary | ICD-10-CM | POA: Diagnosis not present

## 2016-01-07 DIAGNOSIS — H52223 Regular astigmatism, bilateral: Secondary | ICD-10-CM | POA: Diagnosis not present

## 2016-01-15 MED FILL — ESCITALOPRAM 20 MG TABLET: 20 | 90 days supply | Qty: 90 | Fill #0

## 2016-04-16 MED FILL — ESCITALOPRAM 20 MG TABLET: 20 | 90 days supply | Qty: 90 | Fill #1

## 2016-05-20 DIAGNOSIS — D225 Melanocytic nevi of trunk: Secondary | ICD-10-CM | POA: Diagnosis not present

## 2016-05-20 DIAGNOSIS — D485 Neoplasm of uncertain behavior of skin: Secondary | ICD-10-CM | POA: Diagnosis not present

## 2016-05-20 DIAGNOSIS — B078 Other viral warts: Secondary | ICD-10-CM | POA: Diagnosis not present

## 2016-05-20 DIAGNOSIS — L918 Other hypertrophic disorders of the skin: Secondary | ICD-10-CM | POA: Diagnosis not present

## 2016-07-06 MED FILL — ESCITALOPRAM 20 MG TABLET: 20 | 90 days supply | Qty: 90 | Fill #2

## 2016-07-31 IMAGING — CR DG CHEST 2V
2 series · 2 of 2 positions shown · non-contrast
Comparison: PA and lateral chest of October 12, 2013

CLINICAL DATA: Near syncopal episode

EXAM:
CHEST  2 VIEW

[w chest pa]
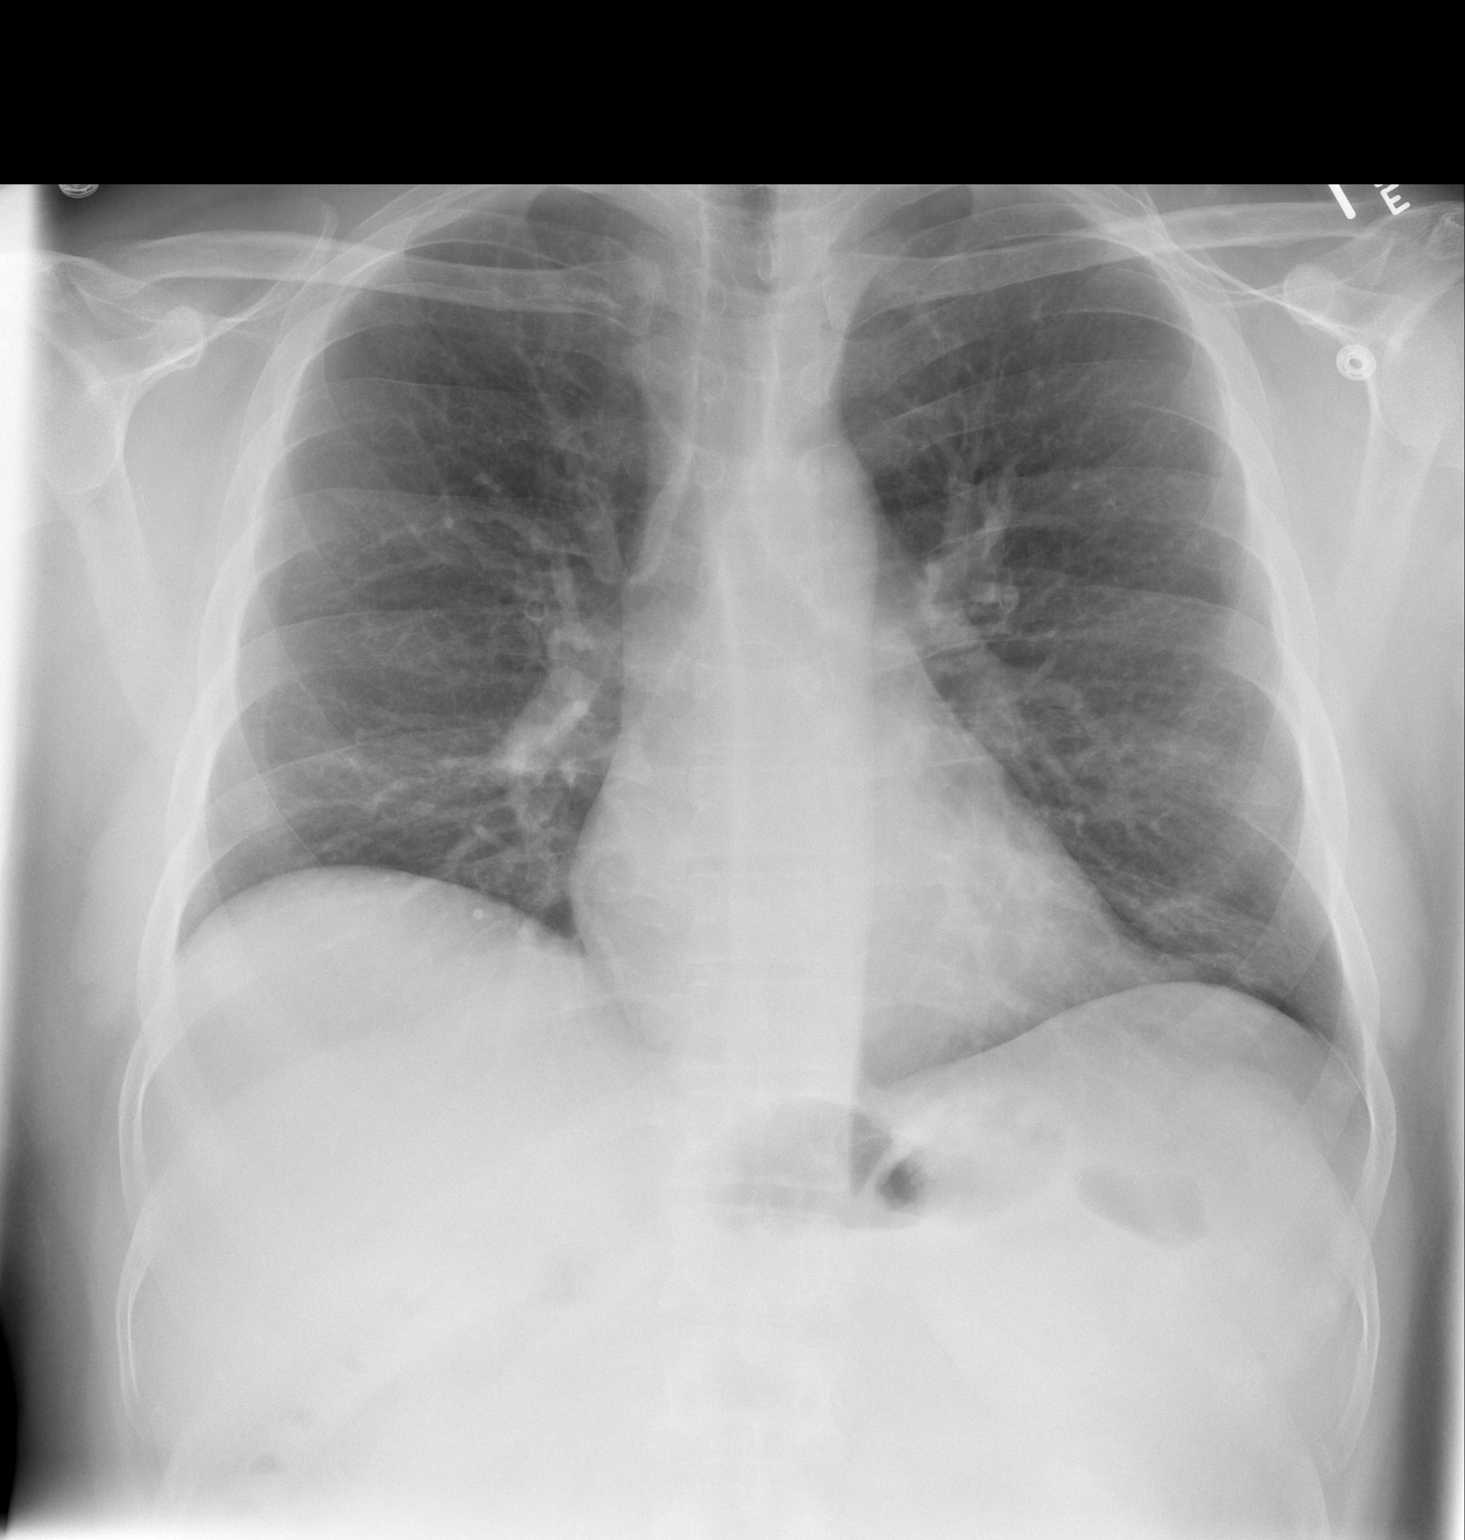

[w chest lat]
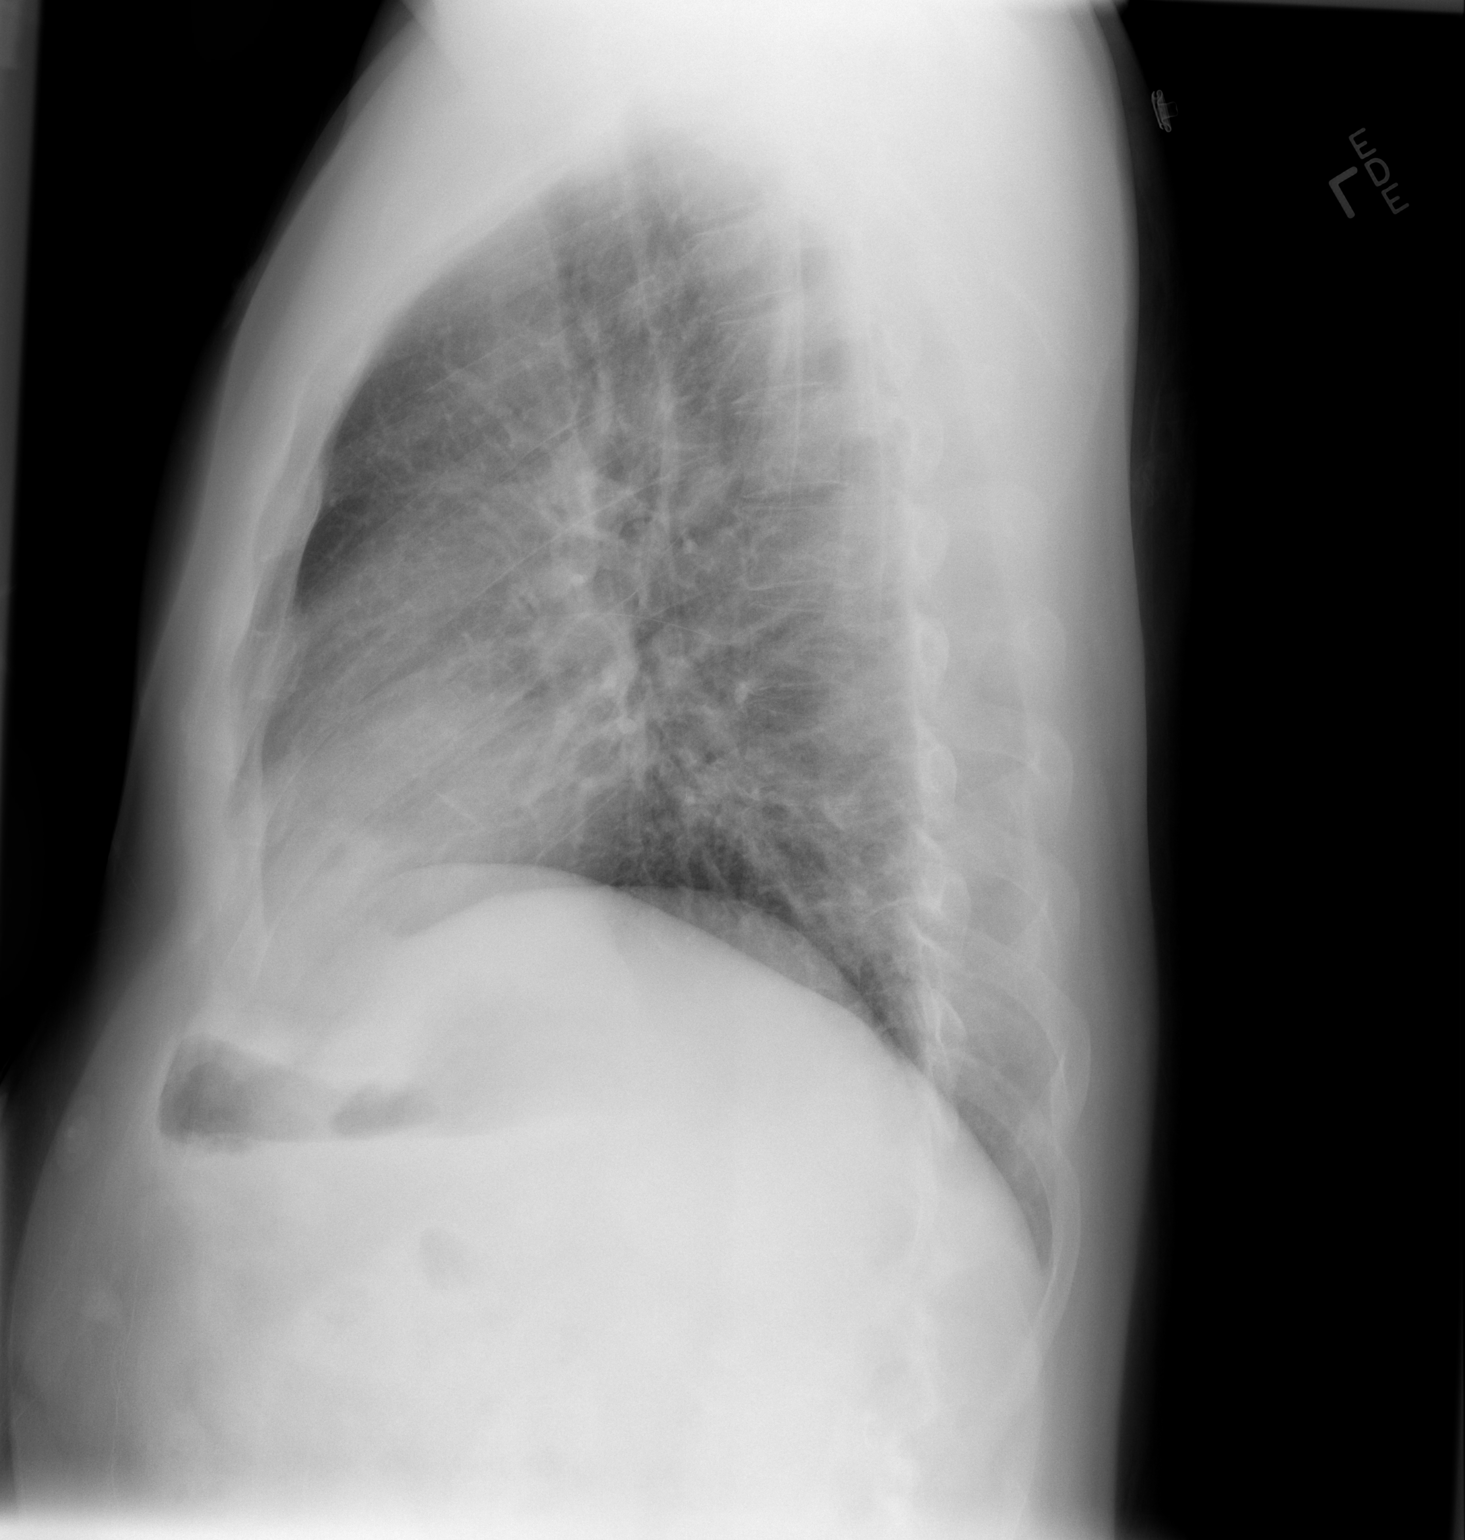

[2 of 2 positions shown; findings below may reference images not displayed]

FINDINGS: The lungs are adequately inflated. There is no focal infiltrate. The
interstitial markings are coarse bilaterally. The heart and
pulmonary vascularity are within the limits of normal. There is no
pleural effusion. The mediastinum is normal in width. The bony
thorax is unremarkable.
IMPRESSION: There is no alveolar pneumonia nor CHF. The interstitial lung
markings are coarse and have become more conspicuous since the
previous study. If the patient is having any acute pulmonary
symptoms, this could reflect subsegmental atelectasis or early
interstitial pneumonia.

## 2016-10-12 MED FILL — ESCITALOPRAM 20 MG TABLET: 20 | 90 days supply | Qty: 90 | Fill #3

## 2016-12-31 DIAGNOSIS — E6609 Other obesity due to excess calories: Secondary | ICD-10-CM | POA: Diagnosis not present

## 2016-12-31 DIAGNOSIS — K644 Residual hemorrhoidal skin tags: Secondary | ICD-10-CM | POA: Diagnosis not present

## 2016-12-31 DIAGNOSIS — Z1389 Encounter for screening for other disorder: Secondary | ICD-10-CM | POA: Diagnosis not present

## 2016-12-31 DIAGNOSIS — Z6835 Body mass index (BMI) 35.0-35.9, adult: Secondary | ICD-10-CM | POA: Diagnosis not present

## 2017-01-11 MED FILL — ESCITALOPRAM 20 MG TABLET: 20 | 90 days supply | Qty: 90 | Fill #0

## 2017-02-10 ENCOUNTER — Ambulatory Visit: Payer: Self-pay | Admitting: General Surgery

## 2017-04-15 MED FILL — ESCITALOPRAM 20 MG TABLET: 20 | 90 days supply | Qty: 90 | Fill #0

## 2017-08-18 MED FILL — ESCITALOPRAM 20 MG TABLET: 20 | 90 days supply | Qty: 90 | Fill #0

## 2017-11-14 MED FILL — ESCITALOPRAM 20 MG TABLET: 20 | 90 days supply | Qty: 90 | Fill #1

## 2017-12-14 DIAGNOSIS — R05 Cough: Secondary | ICD-10-CM | POA: Diagnosis not present

## 2017-12-14 DIAGNOSIS — Z6836 Body mass index (BMI) 36.0-36.9, adult: Secondary | ICD-10-CM | POA: Diagnosis not present

## 2017-12-14 DIAGNOSIS — Z1389 Encounter for screening for other disorder: Secondary | ICD-10-CM | POA: Diagnosis not present

## 2017-12-14 DIAGNOSIS — E6609 Other obesity due to excess calories: Secondary | ICD-10-CM | POA: Diagnosis not present

## 2018-02-17 MED FILL — ESCITALOPRAM 20 MG TABLET: 20 | 90 days supply | Qty: 90 | Fill #2

## 2018-05-11 MED FILL — ESCITALOPRAM 20 MG TABLET: 20 | 90 days supply | Qty: 90 | Fill #0

## 2018-08-11 MED FILL — ESCITALOPRAM 20 MG TABLET: 20 | 90 days supply | Qty: 90 | Fill #1

## 2018-08-16 ENCOUNTER — Emergency Department (HOSPITAL_COMMUNITY)
Admission: EM | Admit: 2018-08-16 | Discharge: 2018-08-16 | Disposition: A | Discharge status: Home / Self Care | Payer: 59

## 2019-01-24 DIAGNOSIS — F329 Major depressive disorder, single episode, unspecified: Secondary | ICD-10-CM | POA: Diagnosis not present

## 2019-01-24 DIAGNOSIS — F419 Anxiety disorder, unspecified: Secondary | ICD-10-CM | POA: Diagnosis not present

## 2019-06-01 ENCOUNTER — Other Ambulatory Visit: Payer: Self-pay | Admitting: Family Medicine

## 2019-06-01 ENCOUNTER — Other Ambulatory Visit (HOSPITAL_COMMUNITY): Payer: Self-pay | Admitting: Family Medicine

## 2019-06-01 DIAGNOSIS — N451 Epididymitis: Secondary | ICD-10-CM

## 2019-06-03 ENCOUNTER — Other Ambulatory Visit: Payer: Self-pay

## 2019-06-03 ENCOUNTER — Encounter (HOSPITAL_COMMUNITY): Payer: Self-pay

## 2019-06-03 ENCOUNTER — Emergency Department (HOSPITAL_COMMUNITY)
Admission: EM | Admit: 2019-06-03 | Discharge: 2019-06-03 | Disposition: A | Discharge status: Home / Self Care | Payer: 59 | Attending: Emergency Medicine | Admitting: Emergency Medicine

## 2019-06-03 DIAGNOSIS — Y9302 Activity, running: Secondary | ICD-10-CM | POA: Insufficient documentation

## 2019-06-03 DIAGNOSIS — S81812A Laceration without foreign body, left lower leg, initial encounter: Secondary | ICD-10-CM | POA: Insufficient documentation

## 2019-06-03 DIAGNOSIS — W269XXA Contact with unspecified sharp object(s), initial encounter: Secondary | ICD-10-CM | POA: Insufficient documentation

## 2019-06-03 DIAGNOSIS — S8992XA Unspecified injury of left lower leg, initial encounter: Secondary | ICD-10-CM | POA: Diagnosis present

## 2019-06-03 DIAGNOSIS — Z23 Encounter for immunization: Secondary | ICD-10-CM | POA: Insufficient documentation

## 2019-06-03 DIAGNOSIS — Y999 Unspecified external cause status: Secondary | ICD-10-CM | POA: Insufficient documentation

## 2019-06-03 DIAGNOSIS — Y929 Unspecified place or not applicable: Secondary | ICD-10-CM | POA: Diagnosis not present

## 2019-06-03 DIAGNOSIS — Z79899 Other long term (current) drug therapy: Secondary | ICD-10-CM | POA: Insufficient documentation

## 2019-06-03 MED ORDER — BACITRACIN-NEOMYCIN-POLYMYXIN 400-5-5000 EX OINT
TOPICAL_OINTMENT | Freq: Once | CUTANEOUS | Status: DC
  Filled 2019-06-03: qty 1

## 2019-06-03 MED ORDER — LIDOCAINE-EPINEPHRINE (PF) 2 %-1:200000 IJ SOLN
10.0000 mL | Freq: Once | INTRAMUSCULAR | Status: AC
  Administered 2019-06-03: 10 mL
  Filled 2019-06-03: qty 10

## 2019-06-03 MED ORDER — TETANUS-DIPHTH-ACELL PERTUSSIS 5-2.5-18.5 LF-MCG/0.5 IM SUSP
0.5000 mL | Freq: Once | INTRAMUSCULAR | Status: AC
  Administered 2019-06-03: 0.5 mL via INTRAMUSCULAR
  Filled 2019-06-03: qty 0.5

## 2019-06-03 NOTE — ED Triage Notes (Signed)
Patient fell at home attempting to chase his cat down. Pt reports he is unsure what he hit his left leg on but he has approximately 1 inch curved laceration on left anterior shin. Bleeding controlled. Pt is able to walk on it. Denies numbness in left leg or foot.

## 2019-06-03 NOTE — Discharge Instructions (Addendum)
You were seen in the emergency department today for a laceration. Your laceration was closed with 6 stitches on the outside and 1 on the inside that will absorb. Please keep this area clean and dry for the next 24 hours, after 24 hours you may get this area wet, but avoid soaking the area. Keep the area covered as best possible especially when in the sun to help in minimizing scarring.   Your tetanus has been updated  You will need to have the stitches removed and the wound rechecked in 10 days. Please return to the emergency department, go to an urgent care, or see your primary care provider to have this performed. Return to the ER soon should you start to experience pus type drainage from the wound, redness around the wound, or fevers as this could indicate the area is infected, please return to the ER for any other worsening symptoms or concerns that you may have.

## 2019-06-03 NOTE — ED Provider Notes (Signed)
[facility] EMERGENCY DEPARTMENT Provider Note   CSN: [id number] Arrival date & time: 06/03/19  2103     History   Chief Complaint Chief Complaint  Patient presents with  . Extremity Laceration    HPI Carlos Stone is a 43 y.o. male who presents to the ED w/ complaints of right lower leg laceration which occurred just PTA. Patient states he was running after his cat when he cut his leg on an unknown objection.  He did not fall to the ground, have head injury, or LOC during event. He reports isolated injury with the laceration, states it is not overly painful. No alleviating/aggravating factors. Denies numbness, tingling, or weakness. Unknown last tetanus.      HPI  Past Medical History:  Diagnosis Date  . Anxiety disorder   . Meningitis     Patient Active Problem List   Diagnosis Date Noted  . Elevated transaminase level 09/24/2013  . Hypokalemia 09/22/2013  . Pleuritic chest pain 09/22/2013  . Community acquired pneumonia 09/21/2013    Past Surgical History:  Procedure Laterality Date  . APPENDECTOMY    . KNEE ARTHROSCOPY          Home Medications    Prior to Admission medications   Medication Sig Start Date End Date Taking? Authorizing Provider  citalopram (CELEXA) 40 MG tablet Take 40 mg by mouth daily.    [provider]    Family History History reviewed. No pertinent family history.  Social History Social History   Tobacco Use  . Smoking status: Never Smoker  . Smokeless tobacco: Never Used  Substance Use Topics  . Alcohol use: No  . Drug use: No     Allergies   Compazine [prochlorperazine edisylate], Amoxicillin, Ciprofloxacin, and Penicillins   Review of Systems Review of Systems  Constitutional: Negative for chills and fever.  Musculoskeletal: Negative for arthralgias, joint swelling and myalgias.  Skin: Positive for wound.  Neurological: Negative for weakness and numbness.     Physical Exam Updated Vital Signs Ht 5'  9" (1.753 m)   Wt 80.7 kg   BMI 26.29 kg/m   Physical Exam Vitals signs and nursing note reviewed.  Constitutional:      General: He is not in acute distress.    Appearance: He is not ill-appearing or toxic-appearing.  HENT:     Head: Normocephalic and atraumatic.  Cardiovascular:     Pulses:          Dorsalis pedis pulses are 2+ on the right side and 2+ on the left side.       Posterior tibial pulses are 2+ on the right side and 2+ on the left side.  Pulmonary:     Effort: Pulmonary effort is normal.  Musculoskeletal:     Comments: Lower extremities: No obvious deformity, appreciable swelling, edema, erythema, or ecchymosis. Patient has a 4 cm upside down U shaped laceration with flap to the mid anterior L lower leg. Approximately 3-4 mm deep, some SQ involvement. NO active bleeding. No appreciable FB. No obvious tendon or fascia involvement. Patient has intact AROM to bilateral hips, knees, ankles, and all digits. nontender to palpation.   Skin:    General: Skin is warm and dry.     Capillary Refill: Capillary refill takes less than 2 seconds.  Neurological:     Mental Status: He is alert.     Comments: Alert. Clear speech. Sensation grossly intact to bilateral lower extremities. 5/5 strength with plantar/dorsiflexion bilaterally. Patient ambulatory, no  foot drop noted.   Psychiatric:        Mood and Affect: Mood normal.        Behavior: Behavior normal.    ED Treatments / Results  Labs (all labs ordered are listed, but only abnormal results are displayed) Labs Reviewed - No data to display  EKG None  Radiology No results found.  Procedures .Marland KitchenLaceration Repair  Date/Time: 06/03/2019 11:01 PM Performed by: Amaryllis Dyke, PA-C Authorized by: Amaryllis Dyke, PA-C   Consent:    Consent obtained:  Verbal   Consent given by:  Patient   Risks discussed:  Infection, need for additional repair, poor cosmetic result, pain, nerve damage, poor wound healing,  vascular damage, tendon damage and retained foreign body   Alternatives discussed:  No treatment Anesthesia (see MAR for exact dosages):    Anesthesia method:  Local infiltration   Local anesthetic:  Lidocaine 2% WITH epi Laceration details:    Location:  Leg   Leg location:  L lower leg   Length (cm):  4   Depth (mm):  4 Repair type:    Repair type:  Intermediate Pre-procedure details:    Preparation:  Patient was prepped and draped in usual sterile fashion Exploration:    Hemostasis achieved with:  Direct pressure   Wound exploration: wound explored through full range of motion and entire depth of wound probed and visualized   Treatment:    Area cleansed with:  Betadine   Amount of cleaning:  Standard   Irrigation solution:  Sterile water   Irrigation volume:  1L   Irrigation method:  Pressure wash Subcutaneous repair:    Suture size:  4-0   Suture material:  Fast-absorbing gut   Suture technique:  Simple interrupted   Number of sutures:  1 Skin repair:    Repair method:  Sutures   Suture size:  4-0   Suture material:  Nylon   Suture technique:  Simple interrupted   Number of sutures:  6 Approximation:    Approximation:  Close Post-procedure details:    Dressing:  Antibiotic ointment and non-adherent dressing   Patient tolerance of procedure:  Tolerated well, no immediate complications   (including critical care time)  Medications Ordered in ED Medications  lidocaine-EPINEPHrine (XYLOCAINE W/EPI) 2 %-1:200000 (PF) injection 10 mL (has no administration in time range)  Tdap (BOOSTRIX) injection 0.5 mL (has no administration in time range)    Initial Impression / Assessment and Plan / ED Course  I have reviewed the triage vital signs and the nursing notes.  Pertinent labs & imaging results that were available during my care of the patient were reviewed by me and considered in my medical decision making (see chart for details).    Patient presents to the emergency  department with laceration to left anterior lower leg which occurred just PTA. Patient nontoxic appearing, resting comfortably. No bony tenderness, doubt underlying fx/dislocation.  Pressure irrigation performed. Wound explored and base of wound visualized in a bloodless field without evidence of foreign body. Laceration repair per procedure note above, tolerated well. NVI distally prior to and following procedure. Tetanus updated at today's visit. Discussed suture home care as well as need for wound recheck and suture removal in 10 days.  I discussed results, treatment plan, need for follow-up, and return precautions with the patient including signs of infection. Provided opportunity for questions, patient confirmed understanding and is in agreement with plan.    Final Clinical Impressions(s) / ED Diagnoses  Final diagnoses:  Laceration of left lower extremity, initial encounter    ED Discharge Orders    None       Leafy Kindle 06/03/19 2314    Noemi Chapel, MD 06/05/19 2022

## 2019-06-05 ENCOUNTER — Encounter (HOSPITAL_COMMUNITY): Payer: Self-pay

## 2019-06-05 ENCOUNTER — Ambulatory Visit (HOSPITAL_COMMUNITY): Admission: RE | Admit: 2019-06-05 | Payer: 59 | Source: Ambulatory Visit

## 2019-06-20 ENCOUNTER — Ambulatory Visit (HOSPITAL_COMMUNITY)
Admission: RE | Admit: 2019-06-20 | Discharge: 2019-06-20 | Disposition: A | Discharge status: Home / Self Care | Payer: 59 | Source: Ambulatory Visit | Attending: Family Medicine | Admitting: Family Medicine

## 2019-06-20 ENCOUNTER — Other Ambulatory Visit: Payer: Self-pay

## 2019-06-20 DIAGNOSIS — N451 Epididymitis: Secondary | ICD-10-CM | POA: Insufficient documentation

## 2019-11-21 ENCOUNTER — Other Ambulatory Visit: Payer: Self-pay

## 2019-11-21 ENCOUNTER — Ambulatory Visit: Payer: 59 | Attending: Internal Medicine

## 2019-11-21 DIAGNOSIS — Z20822 Contact with and (suspected) exposure to covid-19: Secondary | ICD-10-CM

## 2019-11-22 LAB — NOVEL CORONAVIRUS, NAA: SARS-CoV-2, NAA: DETECTED — AB

## 2019-11-30 ENCOUNTER — Other Ambulatory Visit: Payer: Self-pay

## 2019-11-30 ENCOUNTER — Ambulatory Visit: Payer: 59 | Attending: Internal Medicine

## 2019-11-30 DIAGNOSIS — Z20822 Contact with and (suspected) exposure to covid-19: Secondary | ICD-10-CM

## 2019-12-01 LAB — NOVEL CORONAVIRUS, NAA

## 2019-12-03 ENCOUNTER — Other Ambulatory Visit: Payer: 59

## 2020-05-28 DIAGNOSIS — E663 Overweight: Secondary | ICD-10-CM | POA: Diagnosis not present

## 2020-05-28 DIAGNOSIS — Z6829 Body mass index (BMI) 29.0-29.9, adult: Secondary | ICD-10-CM | POA: Diagnosis not present

## 2020-05-28 DIAGNOSIS — K649 Unspecified hemorrhoids: Secondary | ICD-10-CM | POA: Diagnosis not present

## 2020-05-28 DIAGNOSIS — F329 Major depressive disorder, single episode, unspecified: Secondary | ICD-10-CM | POA: Diagnosis not present

## 2020-05-28 DIAGNOSIS — Z0000 Encounter for general adult medical examination without abnormal findings: Secondary | ICD-10-CM | POA: Diagnosis not present

## 2020-05-28 DIAGNOSIS — Z1389 Encounter for screening for other disorder: Secondary | ICD-10-CM | POA: Diagnosis not present

## 2020-08-07 DIAGNOSIS — E6609 Other obesity due to excess calories: Secondary | ICD-10-CM | POA: Diagnosis not present

## 2020-08-07 DIAGNOSIS — K5732 Diverticulitis of large intestine without perforation or abscess without bleeding: Secondary | ICD-10-CM | POA: Diagnosis not present

## 2020-08-07 DIAGNOSIS — Z6830 Body mass index (BMI) 30.0-30.9, adult: Secondary | ICD-10-CM | POA: Diagnosis not present

## 2020-08-07 DIAGNOSIS — R1032 Left lower quadrant pain: Secondary | ICD-10-CM | POA: Diagnosis not present

## 2021-01-27 DIAGNOSIS — J069 Acute upper respiratory infection, unspecified: Secondary | ICD-10-CM | POA: Diagnosis not present

## 2021-03-08 ENCOUNTER — Emergency Department (HOSPITAL_COMMUNITY): Payer: BC Managed Care – PPO

## 2021-03-08 ENCOUNTER — Emergency Department (HOSPITAL_COMMUNITY)
Admission: EM | Admit: 2021-03-08 | Discharge: 2021-03-08 | Disposition: A | Discharge status: Home / Self Care | Payer: BC Managed Care – PPO | Attending: Emergency Medicine | Admitting: Emergency Medicine

## 2021-03-08 ENCOUNTER — Encounter (HOSPITAL_COMMUNITY): Payer: Self-pay

## 2021-03-08 ENCOUNTER — Other Ambulatory Visit: Payer: Self-pay

## 2021-03-08 DIAGNOSIS — R55 Syncope and collapse: Secondary | ICD-10-CM | POA: Insufficient documentation

## 2021-03-08 DIAGNOSIS — R410 Disorientation, unspecified: Secondary | ICD-10-CM | POA: Diagnosis not present

## 2021-03-08 DIAGNOSIS — B349 Viral infection, unspecified: Secondary | ICD-10-CM | POA: Diagnosis not present

## 2021-03-08 DIAGNOSIS — R61 Generalized hyperhidrosis: Secondary | ICD-10-CM | POA: Diagnosis not present

## 2021-03-08 DIAGNOSIS — R42 Dizziness and giddiness: Secondary | ICD-10-CM | POA: Diagnosis not present

## 2021-03-08 DIAGNOSIS — R0689 Other abnormalities of breathing: Secondary | ICD-10-CM | POA: Diagnosis not present

## 2021-03-08 LAB — COMPREHENSIVE METABOLIC PANEL
ALT: 18 U/L (ref 0–44)
AST: 15 U/L (ref 15–41)
Albumin: 4.2 g/dL (ref 3.5–5.0)
Alkaline Phosphatase: 60 U/L (ref 38–126)
Anion gap: 7 (ref 5–15)
BUN: 17 mg/dL (ref 6–20)
CO2: 26 mmol/L (ref 22–32)
Calcium: 8.8 mg/dL — ABNORMAL LOW (ref 8.9–10.3)
Chloride: 105 mmol/L (ref 98–111)
Creatinine, Ser: 0.87 mg/dL (ref 0.61–1.24)
GFR, Estimated: >60 mL/min (ref >60)
Glucose, Bld: 130 mg/dL — ABNORMAL HIGH (ref 70–99)
Potassium: 3.6 mmol/L (ref 3.5–5.1)
Sodium: 138 mmol/L (ref 135–145)
Total Bilirubin: 1 mg/dL (ref 0.3–1.2)
Total Protein: 6.5 g/dL (ref 6.5–8.1)

## 2021-03-08 LAB — CBC WITH DIFFERENTIAL/PLATELET
Abs Immature Granulocytes: 0.1 10*3/uL — ABNORMAL HIGH (ref 0.00–0.07)
Basophils Absolute: 0.1 10*3/uL (ref 0.0–0.1)
Basophils Relative: 0 %
Eosinophils Absolute: 0 10*3/uL (ref 0.0–0.5)
Eosinophils Relative: 0 %
HCT: 43.7 % (ref 39.0–52.0)
Hemoglobin: 14.2 g/dL (ref 13.0–17.0)
Immature Granulocytes: 1 %
Lymphocytes Relative: 9 %
Lymphs Abs: 1.3 10*3/uL (ref 0.7–4.0)
MCH: 29 pg (ref 26.0–34.0)
MCHC: 32.5 g/dL (ref 30.0–36.0)
MCV: 89.4 fL (ref 80.0–100.0)
Monocytes Absolute: 0.8 10*3/uL (ref 0.1–1.0)
Monocytes Relative: 6 %
Neutro Abs: 12 10*3/uL — ABNORMAL HIGH (ref 1.7–7.7)
Neutrophils Relative %: 84 %
Platelets: 249 10*3/uL (ref 150–400)
RBC: 4.89 MIL/uL (ref 4.22–5.81)
RDW: 12.5 % (ref 11.5–15.5)
WBC: 14.2 10*3/uL — ABNORMAL HIGH (ref 4.0–10.5)
nRBC: 0 % (ref 0.0–0.2)

## 2021-03-08 LAB — CBG MONITORING, ED: Glucose-Capillary: 120 mg/dL — ABNORMAL HIGH (ref 70–99)

## 2021-03-08 MED ORDER — SODIUM CHLORIDE 0.9 % IV BOLUS
1000.0000 mL | Freq: Once | INTRAVENOUS | Status: AC
  Administered 2021-03-08: 1000 mL via INTRAVENOUS

## 2021-03-08 MED ORDER — ONDANSETRON 4 MG PO TBDP: 4.0000 mg | ORAL_TABLET | Freq: Three times a day (TID) | ORAL | 1 refills | Status: DC | PRN

## 2021-03-08 MED ORDER — ONDANSETRON HCL 4 MG/2ML IJ SOLN
4.0000 mg | Freq: Once | INTRAMUSCULAR | Status: AC
  Administered 2021-03-08: 4 mg via INTRAVENOUS
  Filled 2021-03-08: qty 2

## 2021-03-08 NOTE — ED Triage Notes (Signed)
Pt [place] ED [place] [facility] [place] [facility] with a syncope episode. Pt unsure if he hit his head but said he feels like he did.  Pt is  A&O x 3 .

## 2021-03-08 NOTE — Discharge Instructions (Signed)
Take Zofran as needed for nausea and vomiting.  If you improve over the next 12 to 24 hours this most likely was a viral type illness.  Return for any recurrent passing out or new or worse symptoms.  Make an appointment follow-up with your doctor as needed.  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

## 2021-03-08 NOTE — ED Provider Notes (Signed)
[facility] EMERGENCY DEPARTMENT Provider Note   CSN: [id number] Arrival date & time: 03/08/21  1127     History Chief Complaint  Patient presents with  . Near Syncope    Carlos Stone is a 45 y.o. male.  Patient was at church.  He was standing.  He started to feel lightheaded and nauseated.  Then he got diaphoretic.  He was going to the bathroom because he thought he was going to vomit.  He passed out in the bathroom.  He did never vomit.  No diarrhea.  No abdominal pain.  Thinks he may have hit his head.  But he does not have any head pain no neck pain no back pain.  No upper extremity or lower extremity pain.  No one sick at home.  Patient was feeling a little off earlier this morning but felt completely fine yesterday.  Patient is still feeling nauseated.  He is never passed out before.  Definitely no chest pain or shortness of breath.        Past Medical History:  Diagnosis Date  . Anxiety disorder   . Meningitis     Patient Active Problem List   Diagnosis Date Noted  . Elevated transaminase level 09/24/2013  . Hypokalemia 09/22/2013  . Pleuritic chest pain 09/22/2013  . Community acquired pneumonia 09/21/2013    Past Surgical History:  Procedure Laterality Date  . APPENDECTOMY    . KNEE ARTHROSCOPY         No family history on file.  Social History   Tobacco Use  . Smoking status: Never Smoker  . Smokeless tobacco: Never Used  Substance Use Topics  . Alcohol use: Yes    Alcohol/week: 1.0 standard drink    Types: 1 Glasses of wine per week    Comment: occasional  . Drug use: No    Home Medications Prior to Admission medications   Medication Sig Start Date End Date Taking? Authorizing Provider  escitalopram (LEXAPRO) 20 MG tablet Take 1 tablet by mouth daily. 02/23/21  Yes [provider]  ondansetron (ZOFRAN ODT) 4 MG disintegrating tablet Take 1 tablet (4 mg total) by mouth every 8 (eight) hours as needed. 03/08/21  Yes Fredia Sorrow, MD     Allergies    Compazine [prochlorperazine edisylate], Amoxicillin, Ciprofloxacin, and Penicillins  Review of Systems   Review of Systems  Constitutional: Negative for chills and fever.  HENT: Negative for rhinorrhea and sore throat.   Eyes: Negative for visual disturbance.  Respiratory: Negative for cough and shortness of breath.   Cardiovascular: Negative for chest pain and leg swelling.  Gastrointestinal: Positive for nausea. Negative for abdominal pain, diarrhea and vomiting.  Genitourinary: Negative for dysuria.  Musculoskeletal: Negative for back pain and neck pain.  Skin: Negative for rash.  Neurological: Positive for syncope. Negative for dizziness, light-headedness and headaches.  Hematological: Does not bruise/bleed easily.  Psychiatric/Behavioral: Negative for confusion.    Physical Exam Updated Vital Signs BP 108/70   Pulse (!) 54   Temp 98.6 F (37 C) (Oral)   Resp 17   Ht 1.753 m (5\' 9" )   Wt 88 kg   SpO2 97%   BMI 28.65 kg/m   Physical Exam Vitals and nursing note reviewed.  Constitutional:      Appearance: Normal appearance. He is well-developed.  HENT:     Head: Normocephalic and atraumatic.  Eyes:     Extraocular Movements: Extraocular movements intact.     Conjunctiva/sclera: Conjunctivae normal.  Pupils: Pupils are equal, round, and reactive to light.  Neck:     Comments: Good range of motion of the neck no tenderness to the neck no rigidity Cardiovascular:     Rate and Rhythm: Normal rate and regular rhythm.     Heart sounds: No murmur heard.   Pulmonary:     Effort: Pulmonary effort is normal. No respiratory distress.     Breath sounds: Normal breath sounds.  Abdominal:     Palpations: Abdomen is soft.     Tenderness: There is no abdominal tenderness.  Musculoskeletal:        General: No swelling. Normal range of motion.     Cervical back: Normal range of motion and neck supple. No rigidity or tenderness.     Comments: No  tenderness to thoracic or lumbar back  Skin:    General: Skin is warm and dry.     Capillary Refill: Capillary refill takes less than 2 seconds.  Neurological:     General: No focal deficit present.     Mental Status: He is alert and oriented to person, place, and time.     Cranial Nerves: No cranial nerve deficit.     Sensory: No sensory deficit.     ED Results / Procedures / Treatments   Labs (all labs ordered are listed, but only abnormal results are displayed) Labs Reviewed  COMPREHENSIVE METABOLIC PANEL - Abnormal; Notable for the following components:      Result Value   Glucose, Bld 130 (*)    Calcium 8.8 (*)    All other components within normal limits  CBC WITH DIFFERENTIAL/PLATELET - Abnormal; Notable for the following components:   WBC 14.2 (*)    Neutro Abs 12.0 (*)    Abs Immature Granulocytes 0.10 (*)    All other components within normal limits  CBG MONITORING, ED - Abnormal; Notable for the following components:   Glucose-Capillary 120 (*)    All other components within normal limits    EKG EKG Interpretation  Date/Time:  Sunday Mar 08 2021 11:36:53 EDT Ventricular Rate:  55 PR Interval:  172 QRS Duration: 90 QT Interval:  419 QTC Calculation: 401 R Axis:   41 Text Interpretation: Sinus rhythm RSR' in V1 or V2, probably normal variant Confirmed by Fredia Sorrow [phone]) on 03/08/2021 12:39:01 PM   Radiology DG Chest Port 1 View  Result Date: 03/08/2021 CLINICAL DATA:  Pt presented to ED via [facility] from church with a syncope episode earlier. Pt unsure if he hit his head but said he feels like he did. No previous surgery to chest. No pending Covid test. EXAM: PORTABLE CHEST 1 VIEW COMPARISON:  06/20/2014. FINDINGS: Normal heart, mediastinum and hila. Clear lungs.  No pleural effusion or pneumothorax. Skeletal structures are grossly intact. IMPRESSION: No active disease. Electronically Signed   By: Lajean Manes M.D.   On: 03/08/2021 12:45     Procedures Procedures   Medications Ordered in ED Medications  ondansetron (ZOFRAN) injection 4 mg (4 mg Intravenous Given 03/08/21 1346)  sodium chloride 0.9 % bolus 1,000 mL (1,000 mLs Intravenous New Bag/Given 03/08/21 1344)    ED Course  I have reviewed the triage vital signs and the nursing notes.  Pertinent labs & imaging results that were available during my care of the patient were reviewed by me and considered in my medical decision making (see chart for details).    MDM Rules/Calculators/A&P  Syncopal episode sounds very vasovagal.  Cardiac monitoring here without any arrhythmias.  EKG without any acute findings.  Electrolytes are normal blood sugar at 130 LFTs normal.  White blood cell count up a little bit of 14,000 hemoglobin normal.  Patient feeling much better given 1 L of fluid.  Given antinausea medicine.  Will discharge home with precautions to return for any recurrent symptoms or if things persist follow-up with primary care doctor.  He will return if he passes out again.  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  He will stay home tomorrow.   Final Clinical Impression(s) / ED Diagnoses Final diagnoses:  Vasovagal syncope  Viral illness    Rx / DC Orders ED Discharge Orders         Ordered    ondansetron (ZOFRAN ODT) 4 MG disintegrating tablet  Every 8 hours PRN        03/08/21 1510           Fredia Sorrow, MD 03/08/21 1514

## 2021-03-16 DIAGNOSIS — R55 Syncope and collapse: Secondary | ICD-10-CM | POA: Diagnosis not present

## 2021-03-16 DIAGNOSIS — Z6830 Body mass index (BMI) 30.0-30.9, adult: Secondary | ICD-10-CM | POA: Diagnosis not present

## 2021-03-16 DIAGNOSIS — E6609 Other obesity due to excess calories: Secondary | ICD-10-CM | POA: Diagnosis not present

## 2021-03-16 DIAGNOSIS — Z1331 Encounter for screening for depression: Secondary | ICD-10-CM | POA: Diagnosis not present

## 2021-06-03 DIAGNOSIS — F419 Anxiety disorder, unspecified: Secondary | ICD-10-CM | POA: Diagnosis not present

## 2021-06-03 DIAGNOSIS — E7849 Other hyperlipidemia: Secondary | ICD-10-CM | POA: Diagnosis not present

## 2021-06-03 DIAGNOSIS — Z0000 Encounter for general adult medical examination without abnormal findings: Secondary | ICD-10-CM | POA: Diagnosis not present

## 2021-06-03 DIAGNOSIS — E782 Mixed hyperlipidemia: Secondary | ICD-10-CM | POA: Diagnosis not present

## 2021-06-03 DIAGNOSIS — K5732 Diverticulitis of large intestine without perforation or abscess without bleeding: Secondary | ICD-10-CM | POA: Diagnosis not present

## 2021-06-03 DIAGNOSIS — Z6830 Body mass index (BMI) 30.0-30.9, adult: Secondary | ICD-10-CM | POA: Diagnosis not present

## 2021-06-03 DIAGNOSIS — R001 Bradycardia, unspecified: Secondary | ICD-10-CM | POA: Diagnosis not present

## 2021-07-07 DIAGNOSIS — R109 Unspecified abdominal pain: Secondary | ICD-10-CM | POA: Diagnosis not present

## 2021-07-07 DIAGNOSIS — E663 Overweight: Secondary | ICD-10-CM | POA: Diagnosis not present

## 2021-07-07 DIAGNOSIS — Z6829 Body mass index (BMI) 29.0-29.9, adult: Secondary | ICD-10-CM | POA: Diagnosis not present

## 2021-07-31 IMAGING — US ULTRASOUND SCROTUM DOPPLER COMPLETE
1 series · 13 of 25 positions shown · non-contrast
Comparison: None.

CLINICAL DATA: Left testicular pain.  Epididymitis.

EXAM:
SCROTAL ULTRASOUND
DOPPLER ULTRASOUND OF THE TESTICLES
TECHNIQUE: Complete ultrasound examination of the testicles, epididymis, and
other scrotal structures was performed. Color and spectral Doppler
ultrasound were also utilized to evaluate blood flow to the
testicles.

[Series 1: ultrasound scrotum doppler complete · 0.05mm/px · 131 acquisitions, 13 frames shown]
[im 1/131]
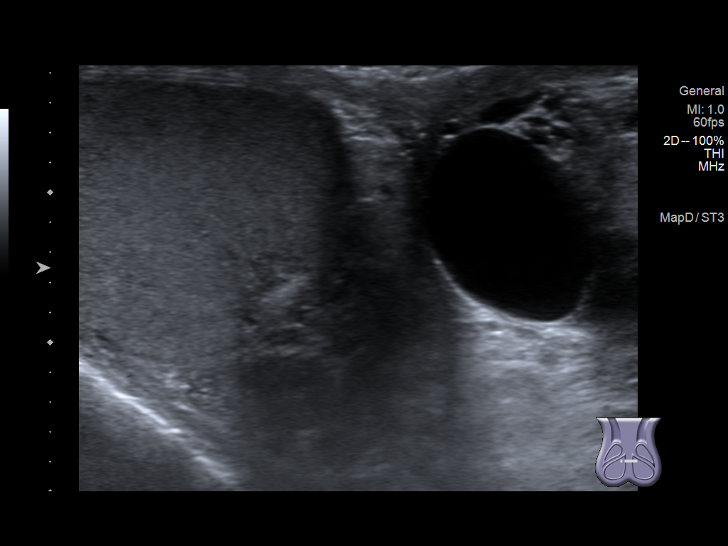
[im 11/131]
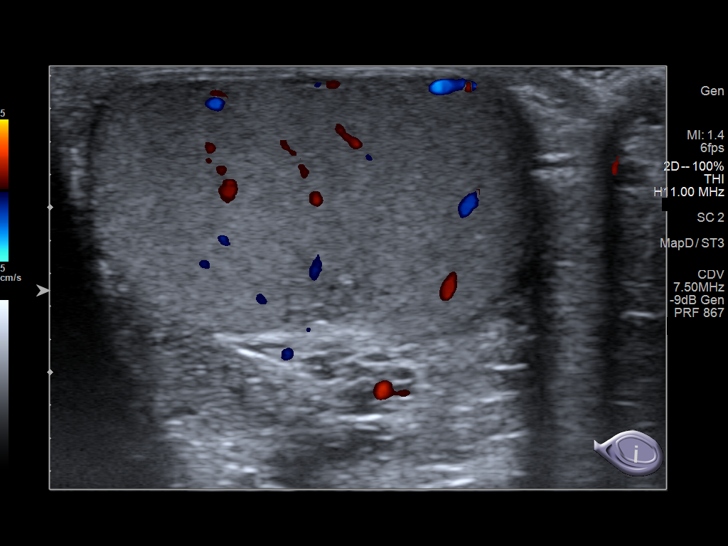
[im 22/131]
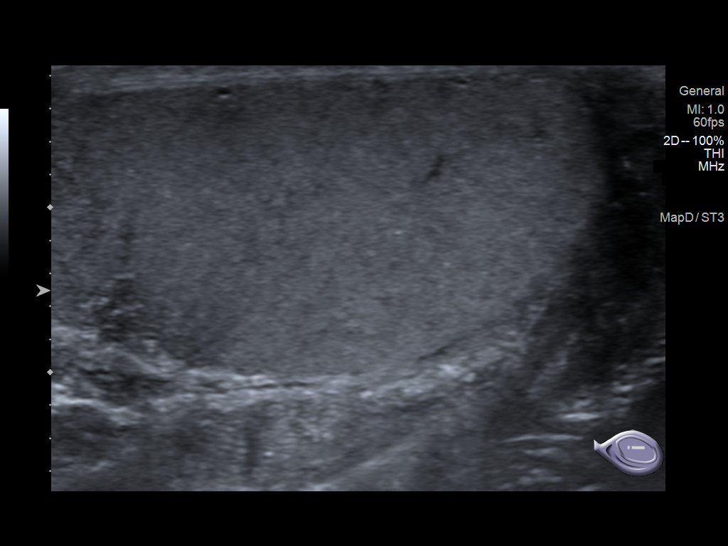
[im 33/131]
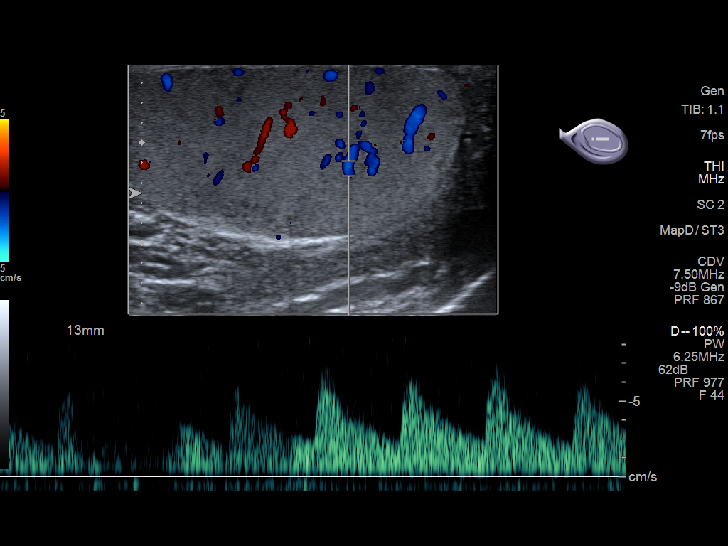
[im 44/131]
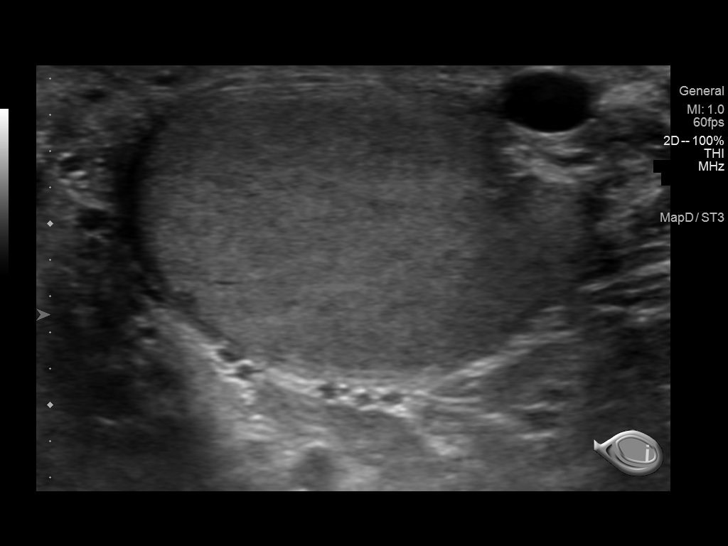
[im 55/131]
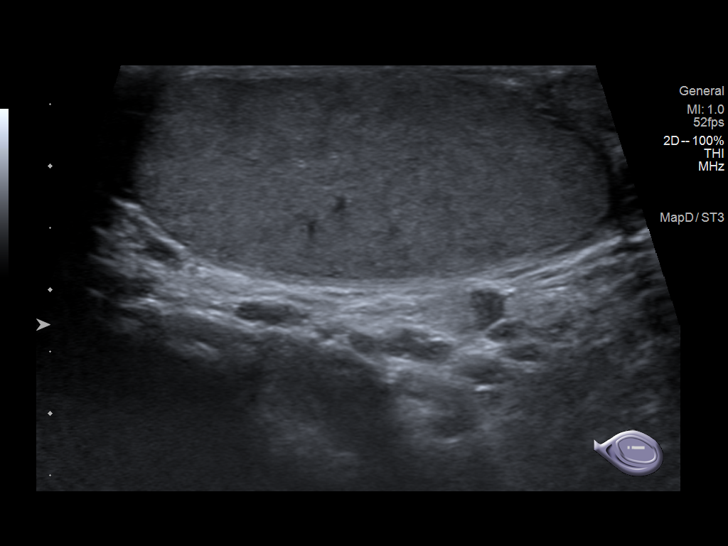
[im 66/131]
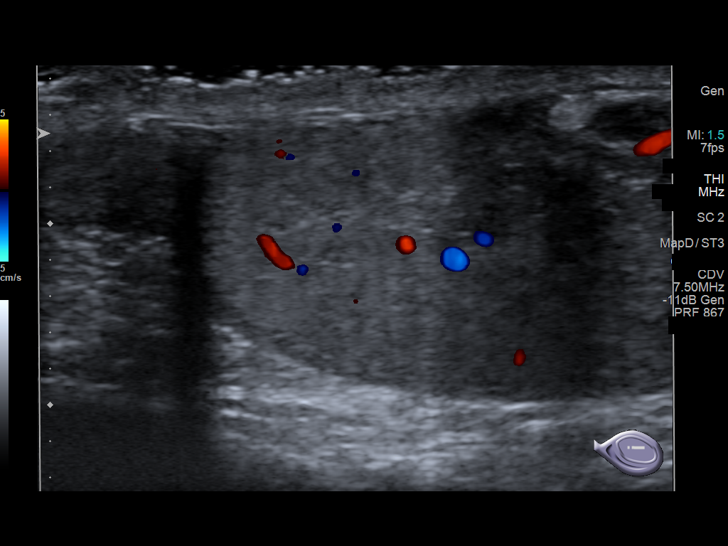
[im 76/131]
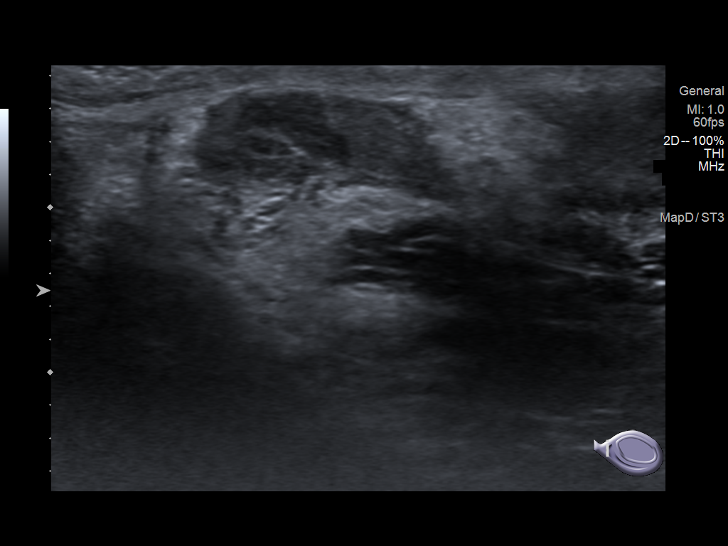
[im 87/131]
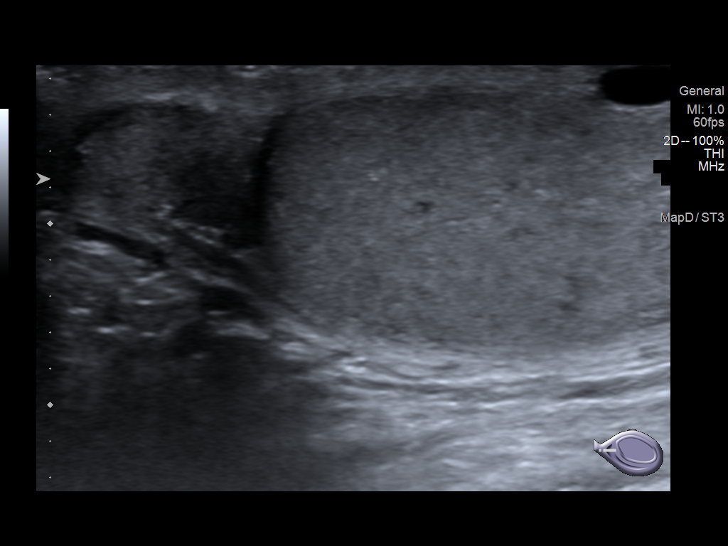
[im 98/131]
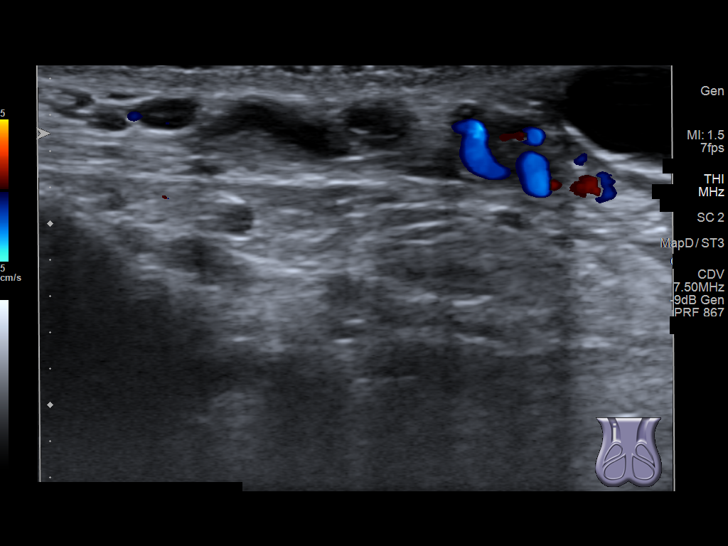
[im 109/131]
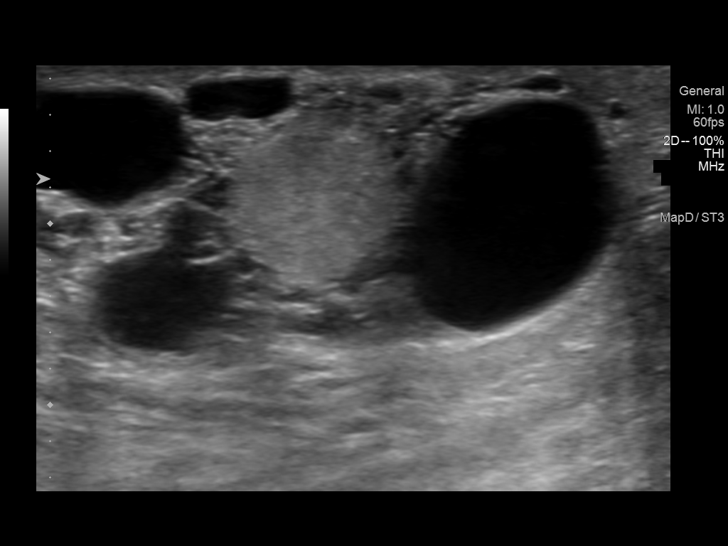
[im 120/131]
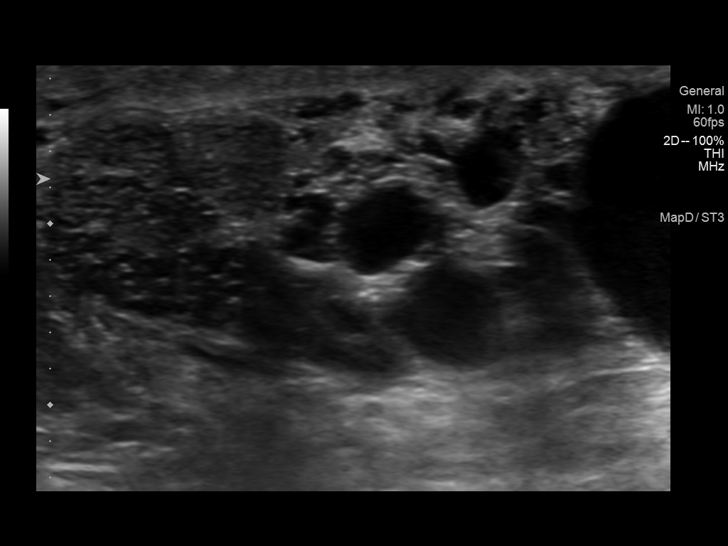
[im 131/131]
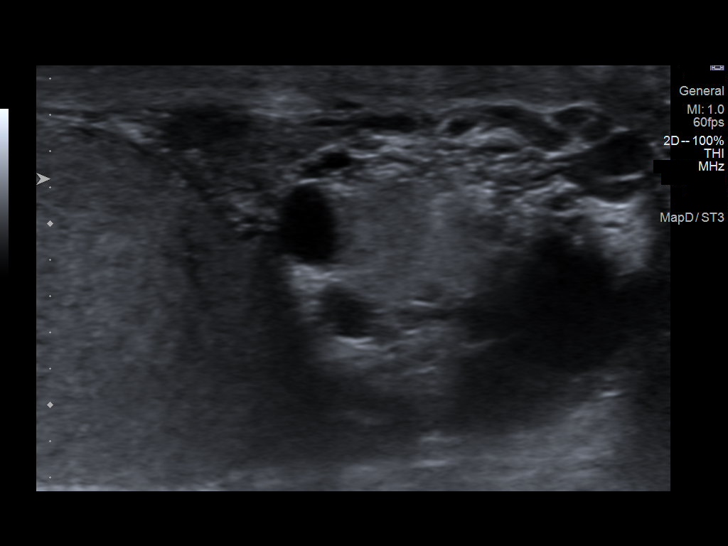

[13 of 25 positions shown; findings below may reference images not displayed]

FINDINGS: Right testicle

Measurements: 3.8 x 2.8 x 2.0 cm. No mass or microlithiasis
visualized.

Left testicle

Measurements: 4.0 x 2.5 x 1.9 cm. No mass or microlithiasis
visualized.

Right epididymis:  Normal in size and appearance.

Left epididymis: Multiple cysts are noted in the tail, with the
largest measuring 3.1 cm. 1 cm rounded solid density is noted in the
epididymal tail which may represent normal tissue surrounded by
cysts, but possible mass cannot be excluded.

Hydrocele:  None visualized.

Varicocele:  Mild left varicocele is noted.

Pulsed Doppler interrogation of both testes demonstrates normal low
resistance arterial and venous waveforms bilaterally.
IMPRESSION: No evidence of testicular mass or torsion is noted.

Multiple large cysts are seen in the left epididymal tail, with the
largest measuring 3 cm. 1 cm rounded solid density is seen within
the epididymal tail which may simply represent normal tissue
surrounded by cyst, but possible mass cannot be excluded. Follow-up
ultrasound in 3-6 months is recommended to ensure stability or
resolution.

Small left varicocele is noted.

## 2022-07-07 ENCOUNTER — Inpatient Hospital Stay (HOSPITAL_COMMUNITY)
Admission: EM | Admit: 2022-07-07 | Discharge: 2022-07-11 | DRG: 872 | Disposition: A | Discharge status: Home / Self Care | Payer: No Typology Code available for payment source | Attending: Internal Medicine | Admitting: Internal Medicine

## 2022-07-07 ENCOUNTER — Other Ambulatory Visit: Payer: Self-pay

## 2022-07-07 ENCOUNTER — Encounter (HOSPITAL_COMMUNITY): Payer: Self-pay | Admitting: Emergency Medicine

## 2022-07-07 ENCOUNTER — Emergency Department (HOSPITAL_COMMUNITY): Payer: No Typology Code available for payment source

## 2022-07-07 DIAGNOSIS — Z20822 Contact with and (suspected) exposure to covid-19: Secondary | ICD-10-CM | POA: Diagnosis present

## 2022-07-07 DIAGNOSIS — F32A Depression, unspecified: Secondary | ICD-10-CM | POA: Diagnosis present

## 2022-07-07 DIAGNOSIS — A4151 Sepsis due to Escherichia coli [E. coli]: Secondary | ICD-10-CM | POA: Diagnosis present

## 2022-07-07 DIAGNOSIS — Z888 Allergy status to other drugs, medicaments and biological substances status: Secondary | ICD-10-CM

## 2022-07-07 DIAGNOSIS — Z881 Allergy status to other antibiotic agents status: Secondary | ICD-10-CM | POA: Diagnosis not present

## 2022-07-07 DIAGNOSIS — K5720 Diverticulitis of large intestine with perforation and abscess without bleeding: Secondary | ICD-10-CM | POA: Diagnosis present

## 2022-07-07 DIAGNOSIS — F419 Anxiety disorder, unspecified: Secondary | ICD-10-CM | POA: Diagnosis present

## 2022-07-07 DIAGNOSIS — Z79899 Other long term (current) drug therapy: Secondary | ICD-10-CM

## 2022-07-07 DIAGNOSIS — K5792 Diverticulitis of intestine, part unspecified, without perforation or abscess without bleeding: Secondary | ICD-10-CM | POA: Diagnosis not present

## 2022-07-07 DIAGNOSIS — Z880 Allergy status to penicillin: Secondary | ICD-10-CM

## 2022-07-07 DIAGNOSIS — K5780 Diverticulitis of intestine, part unspecified, with perforation and abscess without bleeding: Secondary | ICD-10-CM

## 2022-07-07 DIAGNOSIS — R1032 Left lower quadrant pain: Secondary | ICD-10-CM | POA: Diagnosis present

## 2022-07-07 DIAGNOSIS — K5732 Diverticulitis of large intestine without perforation or abscess without bleeding: Secondary | ICD-10-CM | POA: Diagnosis present

## 2022-07-07 LAB — COMPREHENSIVE METABOLIC PANEL
ALT: 57 U/L — ABNORMAL HIGH (ref 0–44)
AST: 40 U/L (ref 15–41)
Albumin: 3.9 g/dL (ref 3.5–5.0)
Alkaline Phosphatase: 102 U/L (ref 38–126)
Anion gap: 14 (ref 5–15)
BUN: 13 mg/dL (ref 6–20)
CO2: 22 mmol/L (ref 22–32)
Calcium: 9.5 mg/dL (ref 8.9–10.3)
Chloride: 100 mmol/L (ref 98–111)
Creatinine, Ser: 0.94 mg/dL (ref 0.61–1.24)
GFR, Estimated: >60 mL/min (ref >60)
Glucose, Bld: 124 mg/dL — ABNORMAL HIGH (ref 70–99)
Potassium: 4 mmol/L (ref 3.5–5.1)
Sodium: 136 mmol/L (ref 135–145)
Total Bilirubin: 1.2 mg/dL (ref 0.3–1.2)
Total Protein: 7.1 g/dL (ref 6.5–8.1)

## 2022-07-07 LAB — URINALYSIS, ROUTINE W REFLEX MICROSCOPIC
Bilirubin Urine: NEGATIVE
Glucose, UA: NEGATIVE mg/dL
Hgb urine dipstick: NEGATIVE
Ketones, ur: 20 mg/dL — AB
Leukocytes,Ua: NEGATIVE
Nitrite: NEGATIVE
Protein, ur: NEGATIVE mg/dL
Specific Gravity, Urine: 1.038 — ABNORMAL HIGH (ref 1.005–1.030)
pH: 7 (ref 5.0–8.0)

## 2022-07-07 LAB — CBC
HCT: 40.3 % (ref 39.0–52.0)
Hemoglobin: 13.9 g/dL (ref 13.0–17.0)
MCH: 29.3 pg (ref 26.0–34.0)
MCHC: 34.5 g/dL (ref 30.0–36.0)
MCV: 85 fL (ref 80.0–100.0)
Platelets: 291 10*3/uL (ref 150–400)
RBC: 4.74 MIL/uL (ref 4.22–5.81)
RDW: 12.3 % (ref 11.5–15.5)
WBC: 14.3 10*3/uL — ABNORMAL HIGH (ref 4.0–10.5)
nRBC: 0 % (ref 0.0–0.2)

## 2022-07-07 LAB — LIPASE, BLOOD: Lipase: 52 U/L — ABNORMAL HIGH (ref 11–51)

## 2022-07-07 LAB — LACTIC ACID, PLASMA: Lactic Acid, Venous: 1.4 mmol/L (ref 0.5–1.9)

## 2022-07-07 LAB — SARS CORONAVIRUS 2 BY RT PCR: SARS Coronavirus 2 by RT PCR: NEGATIVE

## 2022-07-07 MED ORDER — SODIUM CHLORIDE 0.9 % IV SOLN
1.0000 g | Freq: Once | INTRAVENOUS | Status: AC
  Administered 2022-07-07: 1 g via INTRAVENOUS
  Filled 2022-07-07: qty 10

## 2022-07-07 MED ORDER — ONDANSETRON HCL 4 MG/2ML IJ SOLN
4.0000 mg | Freq: Four times a day (QID) | INTRAMUSCULAR | Status: DC | PRN
  Administered 2022-07-07 – 2022-07-10 (×4): 4 mg via INTRAVENOUS
  Filled 2022-07-07 (×4): qty 2

## 2022-07-07 MED ORDER — ACETAMINOPHEN 325 MG PO TABS
650.0000 mg | ORAL_TABLET | Freq: Four times a day (QID) | ORAL | Status: DC | PRN
  Administered 2022-07-07 – 2022-07-08 (×3): 650 mg via ORAL
  Filled 2022-07-07 (×4): qty 2

## 2022-07-07 MED ORDER — SODIUM CHLORIDE 0.9 % IV SOLN
INTRAVENOUS | Status: AC
Start: 2022-07-07 — End: 2022-07-08

## 2022-07-07 MED ORDER — ONDANSETRON HCL 4 MG PO TABS: 4.0000 mg | ORAL_TABLET | Freq: Four times a day (QID) | ORAL | Status: DC | PRN

## 2022-07-07 MED ORDER — MAGNESIUM CITRATE PO SOLN
1.0000 | Freq: Once | ORAL | Status: DC | PRN
Start: 2022-07-07 — End: 2022-07-11

## 2022-07-07 MED ORDER — MORPHINE SULFATE (PF) 4 MG/ML IV SOLN
4.0000 mg | Freq: Once | INTRAVENOUS | Status: AC
  Administered 2022-07-07: 4 mg via INTRAVENOUS
  Filled 2022-07-07: qty 1

## 2022-07-07 MED ORDER — KETOROLAC TROMETHAMINE 30 MG/ML IJ SOLN: 30.0000 mg | Freq: Four times a day (QID) | INTRAMUSCULAR | Status: DC | PRN

## 2022-07-07 MED ORDER — DIPHENHYDRAMINE HCL 50 MG/ML IJ SOLN
12.5000 mg | Freq: Once | INTRAMUSCULAR | Status: AC
  Administered 2022-07-07: 12.5 mg via INTRAVENOUS
  Filled 2022-07-07: qty 1

## 2022-07-07 MED ORDER — ACETAMINOPHEN 325 MG PO TABS
650.0000 mg | ORAL_TABLET | Freq: Once | ORAL | Status: AC | PRN
  Administered 2022-07-07: 650 mg via ORAL
  Filled 2022-07-07: qty 2

## 2022-07-07 MED ORDER — IOHEXOL 300 MG/ML  SOLN
100.0000 mL | Freq: Once | INTRAMUSCULAR | Status: AC | PRN
  Administered 2022-07-07: 100 mL via INTRAVENOUS

## 2022-07-07 MED ORDER — ROSUVASTATIN CALCIUM 5 MG PO TABS
5.0000 mg | ORAL_TABLET | Freq: Every day | ORAL | Status: DC
  Administered 2022-07-07 – 2022-07-11 (×5): 5 mg via ORAL
  Filled 2022-07-07 (×5): qty 1

## 2022-07-07 MED ORDER — CEFTRIAXONE SODIUM 2 G IJ SOLR
2.0000 g | INTRAMUSCULAR | Status: DC
Start: 2022-07-08 — End: 2022-07-11
  Administered 2022-07-08 – 2022-07-11 (×4): 2 g via INTRAVENOUS
  Filled 2022-07-07 (×4): qty 20

## 2022-07-07 MED ORDER — METRONIDAZOLE 500 MG/100ML IV SOLN
500.0000 mg | Freq: Two times a day (BID) | INTRAVENOUS | Status: DC
  Administered 2022-07-07 – 2022-07-11 (×8): 500 mg via INTRAVENOUS
  Filled 2022-07-07 (×8): qty 100

## 2022-07-07 MED ORDER — OXYCODONE HCL 5 MG PO TABS
5.0000 mg | ORAL_TABLET | ORAL | Status: DC | PRN
  Administered 2022-07-07 – 2022-07-08 (×3): 5 mg via ORAL
  Filled 2022-07-07 (×3): qty 1

## 2022-07-07 MED ORDER — BISACODYL 5 MG PO TBEC: 5.0000 mg | DELAYED_RELEASE_TABLET | Freq: Every day | ORAL | Status: DC | PRN

## 2022-07-07 MED ORDER — ESCITALOPRAM OXALATE 10 MG PO TABS
20.0000 mg | ORAL_TABLET | Freq: Every day | ORAL | Status: DC
  Administered 2022-07-07 – 2022-07-11 (×5): 20 mg via ORAL
  Filled 2022-07-07 (×5): qty 2

## 2022-07-07 MED ORDER — POLYETHYLENE GLYCOL 3350 17 G PO PACK
17.0000 g | PACK | Freq: Every day | ORAL | Status: DC
Start: 2022-07-07 — End: 2022-07-11
  Administered 2022-07-07 – 2022-07-11 (×2): 17 g via ORAL
  Filled 2022-07-07 (×2): qty 1

## 2022-07-07 MED ORDER — ENOXAPARIN SODIUM 40 MG/0.4ML IJ SOSY
40.0000 mg | PREFILLED_SYRINGE | INTRAMUSCULAR | Status: DC
  Administered 2022-07-09 – 2022-07-10 (×2): 40 mg via SUBCUTANEOUS
  Filled 2022-07-07 (×2): qty 0.4

## 2022-07-07 MED ORDER — ONDANSETRON HCL 4 MG/2ML IJ SOLN
4.0000 mg | Freq: Once | INTRAMUSCULAR | Status: AC | PRN
  Administered 2022-07-07: 4 mg via INTRAVENOUS
  Filled 2022-07-07: qty 2

## 2022-07-07 MED ORDER — ENOXAPARIN SODIUM 40 MG/0.4ML IJ SOSY
40.0000 mg | PREFILLED_SYRINGE | INTRAMUSCULAR | Status: DC
  Administered 2022-07-07: 40 mg via SUBCUTANEOUS
  Filled 2022-07-07: qty 0.4

## 2022-07-07 MED ORDER — ACETAMINOPHEN 650 MG RE SUPP: 650.0000 mg | Freq: Four times a day (QID) | RECTAL | Status: DC | PRN

## 2022-07-07 NOTE — ED Triage Notes (Signed)
Pt endorses lower abd pain since 8/24 with nausea and chills. Hx of diverticulitis.

## 2022-07-07 NOTE — Progress Notes (Deleted)
Pharmacy Antibiotic Note  Carlos Stone is a 46 y.o. male admitted on 07/07/2022 with  intra-abdominal infection .  Pharmacy has been consulted for Zosyn dosing. Patient currently on Ceftriaxone and flagyl and being switched to Zosyn. WBC elevated at 14.3, lactate 1.4, had a fever of 102.9 in the ED this AM. CT of abdomen showed abscess formation 4.0 cm x 2.0 cm between colon and bladder wall. Scr at 0.94 around baseline.  Plan: Zosyn 3.375g IV q8h (4 hour infusion).  Height: '5\' 9"'$  (175.3 cm) Weight: 90.3 kg (199 lb) IBW/kg (Calculated) : 70.7  Temp (24hrs), Avg:101.7 F (38.7 C), Min:100.5 F (38.1 C), Max:102.9 F (39.4 C)  Recent Labs  Lab 07/07/22 1140  WBC 14.3*  CREATININE 0.94  LATICACIDVEN 1.4    Estimated Creatinine Clearance: 106.7 mL/min (by C-G formula based on SCr of 0.94 mg/dL).    Allergies  Allergen Reactions   Compazine [Prochlorperazine Edisylate] Other (See Comments)    Hallucination    Amoxil [Amoxicillin] Rash   Cipro [Ciprofloxacin Hcl] Rash   Penicillins Rash    Antimicrobials this admission: 9/6 Ceftriaxone >> 9/6 9/6  Flagyl >> 9/6 9/6 Zosyn >>   Microbiology results: 9/6 BCx: pending   Thank you for allowing pharmacy to be a part of this patient's care.  Sandford Craze, PharmD. [facility] Acute Care PGY-1  07/07/2022 4:05 PM

## 2022-07-07 NOTE — ED Provider Notes (Signed)
Carlos Stone   CSN: [id number] Arrival date & time: 07/07/22  1119     History  Chief Complaint  Patient presents with   Abdominal Pain   Emesis    Carlos Stone is a 46 y.o. male with history of sigmoid diverticulitis presented to ED with left lower sided abdominal pain.  He reports has been ongoing for about 2 weeks, waxing and waning but now persistent.  He says he thought it was another episode of "colitis" and he typically waits them out, and that he has not been on antibiotics, but the symptoms more persistent.  He reports poor appetite.  No bowel movement in several days which is unusual for him.  He is still passing gas.  He reports some fevers and chills at home.  Denies history of abdominal surgery except for appendectomy when he was younger.  He has an upcoming new patient GI appointment in the office in about 1 week, and has never had a colonoscopy appointment  HPI     Home Medications Prior to Admission medications   Medication Sig Start Date End Date Taking? Authorizing Provider  acetaminophen (TYLENOL) 500 MG tablet Take 1,000 mg by mouth every 6 (six) hours as needed for mild pain or headache.   Yes [provider]  ANUCORT-HC 25 MG suppository Place 25 mg rectally 2 (two) times daily as needed for hemorrhoids. 02/16/22  Yes [provider]  clotrimazole-betamethasone (LOTRISONE) cream Apply 1 Application topically 2 (two) times daily. 05/24/22  Yes [provider]  escitalopram (LEXAPRO) 20 MG tablet Take 1 tablet by mouth daily. 02/23/21  Yes [provider]  PROCTO-MED HC 2.5 % rectal cream Place 1 Application rectally 2 (two) times daily as needed for hemorrhoids. 02/16/22  Yes [provider]  rosuvastatin (CRESTOR) 5 MG tablet Take 5 mg by mouth daily. Patient not taking: Reported on 07/07/2022 06/21/22   [provider]      Allergies    Compazine  [prochlorperazine edisylate], Amoxil [amoxicillin], Cipro [ciprofloxacin hcl], and Penicillins    Review of Systems   Review of Systems  Physical Exam Updated Vital Signs BP 103/65   Pulse 61   Temp (!) 100.5 F (38.1 C) (Oral)   Resp 11   Ht '5\' 9"'$  (1.753 m)   Wt 90.3 kg   SpO2 97%   BMI 29.39 kg/m  Physical Exam Constitutional:      General: He is not in acute distress. HENT:     Head: Normocephalic and atraumatic.  Eyes:     Conjunctiva/sclera: Conjunctivae normal.     Pupils: Pupils are equal, round, and reactive to light.  Cardiovascular:     Rate and Rhythm: Normal rate and regular rhythm.  Pulmonary:     Effort: Pulmonary effort is normal. No respiratory distress.  Abdominal:     General: There is no distension.     Tenderness: There is abdominal tenderness in the left lower quadrant. There is guarding.  Skin:    General: Skin is warm and dry.  Neurological:     General: No focal deficit present.     Mental Status: He is alert. Mental status is at baseline.  Psychiatric:        Mood and Affect: Mood normal.        Behavior: Behavior normal.     ED Results / Procedures / Treatments   Labs (all labs ordered are listed, but only abnormal results are displayed)  Labs Reviewed  LIPASE, BLOOD - Abnormal; Notable for the following components:      Result Value   Lipase 52 (*)    All other components within normal limits  COMPREHENSIVE METABOLIC PANEL - Abnormal; Notable for the following components:   Glucose, Bld 124 (*)    ALT 57 (*)    All other components within normal limits  CBC - Abnormal; Notable for the following components:   WBC 14.3 (*)    All other components within normal limits  URINALYSIS, ROUTINE W REFLEX MICROSCOPIC - Abnormal; Notable for the following components:   Specific Gravity, Urine 1.038 (*)    Ketones, ur 20 (*)    All other components within normal limits  SARS CORONAVIRUS 2 BY RT PCR  CULTURE, BLOOD (ROUTINE X 2)  CULTURE,  BLOOD (ROUTINE X 2)  LACTIC ACID, PLASMA  HIV ANTIBODY (ROUTINE TESTING W REFLEX)    EKG None  Radiology CT ABDOMEN PELVIS W CONTRAST  Result Date: 07/07/2022 CLINICAL DATA:  History of sigmoid diverticulitis with recurrent left lower quadrant abdominal pain and fever. EXAM: CT ABDOMEN AND PELVIS WITH CONTRAST TECHNIQUE: Multidetector CT imaging of the abdomen and pelvis was performed using the standard protocol following bolus administration of intravenous contrast. RADIATION DOSE REDUCTION: This exam was performed according to the departmental dose-optimization program which includes automated exposure control, adjustment of the mA and/or kV according to patient size and/or use of iterative reconstruction technique. CONTRAST:  113m OMNIPAQUE IOHEXOL 300 MG/ML  SOLN COMPARISON:  None Available. FINDINGS: Lower chest: Limited visualization of the lower thorax demonstrates minimal bibasilar dependent subpleural ground-glass atelectasis, right greater than left. No discrete focal airspace opacities. No pleural effusion. Normal heart size.  No pericardial effusion. Hepatobiliary: Normal hepatic contour. There is mild diffuse decreased attenuation hepatic parenchyma on this postcontrast examination suggestive of hepatic steatosis. No discrete hepatic lesions. No radiopaque gallstones. No intra or extrahepatic biliary duct dilatation. No ascites. Pancreas: Normal appearance of the pancreas. Spleen: The spleen is incidentally noted to be scratched at the splenic hilum is incidentally noted to be angled cranially. The spleen is otherwise normal in appearance. There is a punctate splenule about the anterior aspect of the hilum. Adrenals/Urinary Tract: There is symmetric enhancement of the bilateral kidneys. No evidence of nephrolithiasis on this postcontrast examination. No discrete renal lesions. No urinary obstruction or perinephric stranding. There is minimal secondary wall thickening involving the dome of the  urinary bladder, adjacent to the sigmoid colonic diverticular abscess. No air is seen within the urinary bladder to suggest the presence of a colovesicular fistula. Stomach/Bowel: There is extensive inflammatory change involving the sigmoid colon within the left lower abdomen/pelvis with associated adjacent mesenteric stranding and an approximately 4.6 x 2.5 x 1.7 cm pericolonic diverticular abscess positioned between the inferior aspect of the sigmoid colon and dome of the urinary bladder (axial image 78, series 3; coronal image 41, series 6). An additional prominent diverticuli is noted adjacent to the superior aspect of this diseased segment of the sigmoid colon (axial image 71, series 3; coronal image 37, series 6) without definitive evidence of perforation at this location. There is extensive wall thickening involving the sigmoid colon at this location with moderate upstream stool burden but without definitive evidence of enteric obstruction. No additional areas of bowel wall thickening are identified. Normal appearance of the terminal ileum. The appendix is not visualized compatible with provided history of previous appendectomy. No significant hiatal hernia. No pneumatosis or portal venous gas. Vascular/Lymphatic: Minimal  amount of atherosclerotic plaque involving the caudal aspect of the abdominal aorta and left common iliac artery, not resulting in a hemodynamically significant stenosis. The major branch vessels of the abdominal aorta appear widely patent on this non CTA examination. No bulky retroperitoneal, mesenteric, pelvic or inguinal lymph adenopathy. Reproductive: Dystrophic calcifications within normal sized prostate gland. No free fluid the pelvic cul-de-sac. Other: Regional soft tissues appear normal. Musculoskeletal: No acute or aggressive osseous abnormalities. Incidental Stone is made of a left-sided L5-S1 assimilation joint. IMPRESSION: 1. The examination positive for acute diverticulitis  involving the sigmoid colon within the left lower abdomen/pelvis with associated approximately 4.6 cm diverticular abscess positioned between the inferior aspect of the disease segment of the sigmoid colon and the urinary bladder. The diseased segment of the colon demonstrates extensive wall thickening however presently there is no evidence of enteric obstruction. 2. Suspected hepatic steatosis. 3.  Aortic Atherosclerosis (ICD10-I70.0). 4. Post appendectomy. Electronically Signed   By: Sandi Mariscal M.D.   On: 07/07/2022 13:40    Procedures Procedures    Medications Ordered in ED Medications  metroNIDAZOLE (FLAGYL) IVPB 500 mg (0 mg Intravenous Stopped 07/07/22 1549)  rosuvastatin (CRESTOR) tablet 5 mg (5 mg Oral Given 07/07/22 1442)  escitalopram (LEXAPRO) tablet 20 mg (20 mg Oral Given 07/07/22 1442)  enoxaparin (LOVENOX) injection 40 mg (40 mg Subcutaneous Given 07/07/22 1443)  acetaminophen (TYLENOL) tablet 650 mg (has no administration in time range)    Or  acetaminophen (TYLENOL) suppository 650 mg (has no administration in time range)  oxyCODONE (Oxy IR/ROXICODONE) immediate release tablet 5 mg (5 mg Oral Given 07/07/22 1443)  ketorolac (TORADOL) 30 MG/ML injection 30 mg (has no administration in time range)  ondansetron (ZOFRAN) tablet 4 mg ( Oral See Alternative 07/07/22 1442)    Or  ondansetron (ZOFRAN) injection 4 mg (4 mg Intravenous Given 07/07/22 1442)  bisacodyl (DULCOLAX) EC tablet 5 mg (has no administration in time range)  magnesium citrate solution 1 Bottle (has no administration in time range)  cefTRIAXone (ROCEPHIN) 2 g in sodium chloride 0.9 % 100 mL IVPB (has no administration in time range)  0.9 %  sodium chloride infusion ( Intravenous New Bag/Given 07/07/22 1524)  polyethylene glycol (MIRALAX / GLYCOLAX) packet 17 g (17 g Oral Given 07/07/22 1524)  ondansetron (ZOFRAN) injection 4 mg (4 mg Intravenous Given 07/07/22 1139)  acetaminophen (TYLENOL) tablet 650 mg (650 mg Oral Given 07/07/22  1139)  cefTRIAXone (ROCEPHIN) 1 g in sodium chloride 0.9 % 100 mL IVPB (0 g Intravenous Stopped 07/07/22 1310)  diphenhydrAMINE (BENADRYL) injection 12.5 mg (12.5 mg Intravenous Given 07/07/22 1227)  iohexol (OMNIPAQUE) 300 MG/ML solution 100 mL (100 mLs Intravenous Contrast Given 07/07/22 1320)  morphine (PF) 4 MG/ML injection 4 mg (4 mg Intravenous Given 07/07/22 1442)    ED Course/ Medical Decision Making/ A&P Clinical Course as of 07/07/22 1626  Wed Jul 07, 2022  1349 General surgery paged [MT]  1354 I spoke to Legrand Como PA with general surgery - they will c/s on patient, agree with antibiotics, recommend medical admission. [MT]  5009 Patient updated regarding plans for admission. [MT]  1418 Admitted o Dr Roosevelt Locks [MT]    Clinical Course User Index [MT] Langston Masker Carola Rhine, MD                           Medical Decision Making Amount and/or Complexity of Data Reviewed Labs: ordered. Radiology: ordered.  Risk OTC drugs. Prescription drug management. Decision  regarding hospitalization.   This patient presents to the ED with concern for lower abdominal pain. This involves an extensive number of treatment options, and is a complaint that carries with it a high risk of complications and morbidity.  The differential diagnosis includes colitis versus diverticulitis versus perforation versus ureteral colic versus pyelonephritis versus other  Co-morbidities that complicate the patient evaluation: History of sigmoid diverticulosis and diverticulitis in the past at high risk of recurrence   I ordered and personally interpreted labs.  The pertinent results include: White blood cell count elevated.  Lactate within normal limits.  I ordered imaging studies including CT abdomen pelvis with contrast I independently visualized and interpreted imaging which showed diverticulitis possible microperforation and 4 x 2 cm subcolonic abscess I agree with the radiologist interpretation  The patient was  maintained on a cardiac monitor.  I personally viewed and interpreted the cardiac monitored which showed an underlying rhythm of: Sinus rhythm  I ordered medication including Rocephin and Benadryl for intra-abdominal infection.  Benadryl as the patient has intolerance to several antibiotic medications.  He reports intolerance to ciprofloxacin and Flagyl (both cause rash) as well as amoxicillin.  I have initiated the Rocephin now for intra-abdominal coverage but he may need additional coverage as well.  We will await the CT scan to try to determine the etiology of his symptoms.    I have reviewed the patients home medicines and have made adjustments as needed  Test Considered: I doubt mesenteric ischemia clinically or testicular torsion.  I requested consultation with the general surgery,  and discussed lab and imaging findings as well as pertinent plan - they recommend: Consult pending at the time of medical admission  After the interventions noted above, I reevaluated the patient and found that they have: stayed the same   Dispostion:  After consideration of the diagnostic results and the patients response to treatment, I feel that the patent would benefit from medical admission         Final Clinical Impression(s) / ED Diagnoses Final diagnoses:  Diverticulitis of colon  Colonic diverticular abscess    Rx / DC Orders ED Discharge Orders     None         Wyvonnia Dusky, MD 07/07/22 1627

## 2022-07-07 NOTE — Consult Note (Signed)
ROGER KETTLES 1976-09-03  [id number].    Requesting MD: Dr. Wynetta Fines Chief Complaint/Reason for Consult: Diverticulitis  HPI: Carlos Stone is a 46 y.o. male who presented with abdominal pain over the last 2 weeks. Patient reports he was dx with diverticulitis based on hx ~1 year ago (no scan) that resolved with abx therapy. Since then every other month he will get similar llq pain that resolves with dietary changes after a few days. 2 weeks ago he began having some LLQ, similar to prior episodes but this time it did not resolve. He reports pain became more severe earlier today. Associated fever and some pressure with urination. No chills, n/v, diarrhea, pneumaturia or flecks of stool in his urine. Last bm 2 days ago and was loose. No hematochezia or melena.   In ED febrile to 102.9. No tachycardia or hypotension. WBC 14.3. Lactic wnl. CT A/P w/ acute diverticulitis involving the sigmoid colon with 4.6 cm diverticular abscess positioned between the inferior aspect of the disease segment of the sigmoid colon and the urinary bladder.   No prior colonoscopy. Had an appointment with GI to get one scheduled. Reports prior open appendectomy. Not on blood thinners. No tobacco use. Drinks occasionally. No drug use. Lives at home with his wife and kids. Works Location manager.   ROS: ROS As above, see HPI  History reviewed. No pertinent family history.  Past Medical History:  Diagnosis Date   Anxiety disorder    Meningitis     Past Surgical History:  Procedure Laterality Date   APPENDECTOMY     KNEE ARTHROSCOPY      Social History:  reports that he has never smoked. He has never used smokeless tobacco. He reports current alcohol use of about 1.0 standard drink of alcohol per week. He reports that he does not use drugs.  Allergies:  Allergies  Allergen Reactions   Compazine [Prochlorperazine Edisylate] Other (See Comments)    Hallucination    Amoxil [Amoxicillin]  Rash   Cipro [Ciprofloxacin Hcl] Rash   Penicillins Rash    (Not in a hospital admission)    Physical Exam: Blood pressure 107/60, pulse 67, temperature (!) 102.9 F (39.4 C), temperature source Oral, resp. rate (!) 22, height '5\' 9"'$  (1.753 m), weight 90.3 kg, SpO2 94 %. General: pleasant, WD/WN male who is laying in bed in NAD HEENT: head is normocephalic, atraumatic.  Sclera are noninjected.  PERRL.  Ears and nose without any masses or lesions.  Mouth is pink and moist. Dentition fair Heart: regular, rate, and rhythm.   Lungs: CTAB, no wheezes, rhonchi, or rales noted.  Respiratory effort nonlabored Abd:  Soft, ND, LLQ ttp without rigidity or guarding, +BS. No masses, hernias, or organomegaly MS: no BUE/BLE edema Skin: warm and dry with no masses, lesions, or rashes Psych: A&Ox4 with an appropriate affect Neuro: cranial nerves grossly intact, normal speech, thought process intact, moves all extremities, gait not assessed  Results for orders placed or performed during the hospital encounter of 07/07/22 (from the past 48 hour(s))  Lipase, blood     Status: Abnormal   Collection Time: 07/07/22 11:40 AM  Result Value Ref Range   Lipase 52 (H) 11 - 51 U/L    Comment: Performed at [facility], [place] [address] [place], [place] [address]  Comprehensive metabolic panel     Status: Abnormal   Collection Time: 07/07/22 11:40 AM  Result Value Ref Range   Sodium 136 135 -  145 mmol/L   Potassium 4.0 3.5 - 5.1 mmol/L   Chloride 100 98 - 111 mmol/L   CO2 22 22 - 32 mmol/L   Glucose, Bld 124 (H) 70 - 99 mg/dL    Comment: Glucose reference range applies only to samples taken after fasting for at least 8 hours.   BUN 13 6 - 20 mg/dL   Creatinine, Ser 0.94 0.61 - 1.24 mg/dL   Calcium 9.5 8.9 - 10.3 mg/dL   Total Protein 7.1 6.5 - 8.1 g/dL   Albumin 3.9 3.5 - 5.0 g/dL   AST 40 15 - 41 U/L   ALT 57 (H) 0 - 44 U/L   Alkaline Phosphatase 102 38 - 126 U/L   Total Bilirubin 1.2 0.3 - 1.2  mg/dL   GFR, Estimated >60 >60 mL/min    Comment: (NOTE) Calculated using the CKD-EPI Creatinine Equation (2021)    Anion gap 14 5 - 15    Comment: Performed at [facility] [address] [place], [place] [address]  CBC     Status: Abnormal   Collection Time: 07/07/22 11:40 AM  Result Value Ref Range   WBC 14.3 (H) 4.0 - 10.5 K/uL   RBC 4.74 4.22 - 5.81 MIL/uL   Hemoglobin 13.9 13.0 - 17.0 g/dL   HCT 40.3 39.0 - 52.0 %   MCV 85.0 80.0 - 100.0 fL   MCH 29.3 26.0 - 34.0 pg   MCHC 34.5 30.0 - 36.0 g/dL   RDW 12.3 11.5 - 15.5 %   Platelets 291 150 - 400 K/uL   nRBC 0.0 0.0 - 0.2 %    Comment: Performed at [facility], [place] [address] [place], [place] [address]  Lactic acid, plasma     Status: None   Collection Time: 07/07/22 11:40 AM  Result Value Ref Range   Lactic Acid, Venous 1.4 0.5 - 1.9 mmol/L    Comment: Performed at [facility] [address] [place], [place] [address]  SARS Coronavirus 2 by RT PCR (hospital order, performed in [facility] hospital lab) *cepheid single result test* Anterior Nasal Swab     Status: None   Collection Time: 07/07/22 11:54 AM   Specimen: Anterior Nasal Swab  Result Value Ref Range   SARS Coronavirus 2 by RT PCR NEGATIVE NEGATIVE    Comment: (NOTE) SARS-CoV-2 target nucleic acids are NOT DETECTED.  The SARS-CoV-2 RNA is generally detectable in upper and lower respiratory specimens during the acute phase of infection. The lowest concentration of SARS-CoV-2 viral copies this assay can detect is 250 copies / mL. A negative result does not preclude SARS-CoV-2 infection and should not be used as the sole basis for treatment or other patient management decisions.  A negative result may occur with improper specimen collection / handling, submission of specimen other than nasopharyngeal swab, presence of viral mutation(s) within the areas targeted by this assay, and inadequate number of viral copies (<250 copies / mL). A  negative result must be combined with clinical observations, patient history, and epidemiological information.  Fact Sheet for Patients:   https://www.patel.info/  Fact Sheet for Healthcare Providers: https://hall.com/  This test is not yet approved or  cleared by the [place] FDA and has been authorized for detection and/or diagnosis of SARS-CoV-2 by FDA under an Emergency Use Authorization (EUA).  This EUA will remain in effect (meaning this test can be used) for the duration of the COVID-19 declaration under Section 564(b)(1) of the  Act, 21 U.S.C. section 360bbb-3(b)(1), unless the authorization is terminated or revoked sooner.  Performed at [facility], [place] [address], [place], [place] [address]    CT ABDOMEN PELVIS W CONTRAST  Result Date: 07/07/2022 CLINICAL DATA:  History of sigmoid diverticulitis with recurrent left lower quadrant abdominal pain and fever. EXAM: CT ABDOMEN AND PELVIS WITH CONTRAST TECHNIQUE: Multidetector CT imaging of the abdomen and pelvis was performed using the standard protocol following bolus administration of intravenous contrast. RADIATION DOSE REDUCTION: This exam was performed according to the departmental dose-optimization program which includes automated exposure control, adjustment of the mA and/or kV according to patient size and/or use of iterative reconstruction technique. CONTRAST:  1105m OMNIPAQUE IOHEXOL 300 MG/ML  SOLN COMPARISON:  None Available. FINDINGS: Lower chest: Limited visualization of the lower thorax demonstrates minimal bibasilar dependent subpleural ground-glass atelectasis, right greater than left. No discrete focal airspace opacities. No pleural effusion. Normal heart size.  No pericardial effusion. Hepatobiliary: Normal hepatic contour. There is mild diffuse decreased attenuation hepatic parenchyma on this postcontrast examination suggestive of hepatic steatosis. No discrete  hepatic lesions. No radiopaque gallstones. No intra or extrahepatic biliary duct dilatation. No ascites. Pancreas: Normal appearance of the pancreas. Spleen: The spleen is incidentally noted to be scratched at the splenic hilum is incidentally noted to be angled cranially. The spleen is otherwise normal in appearance. There is a punctate splenule about the anterior aspect of the hilum. Adrenals/Urinary Tract: There is symmetric enhancement of the bilateral kidneys. No evidence of nephrolithiasis on this postcontrast examination. No discrete renal lesions. No urinary obstruction or perinephric stranding. There is minimal secondary wall thickening involving the dome of the urinary bladder, adjacent to the sigmoid colonic diverticular abscess. No air is seen within the urinary bladder to suggest the presence of a colovesicular fistula. Stomach/Bowel: There is extensive inflammatory change involving the sigmoid colon within the left lower abdomen/pelvis with associated adjacent mesenteric stranding and an approximately 4.6 x 2.5 x 1.7 cm pericolonic diverticular abscess positioned between the inferior aspect of the sigmoid colon and dome of the urinary bladder (axial image 78, series 3; coronal image 41, series 6). An additional prominent diverticuli is noted adjacent to the superior aspect of this diseased segment of the sigmoid colon (axial image 71, series 3; coronal image 37, series 6) without definitive evidence of perforation at this location. There is extensive wall thickening involving the sigmoid colon at this location with moderate upstream stool burden but without definitive evidence of enteric obstruction. No additional areas of bowel wall thickening are identified. Normal appearance of the terminal ileum. The appendix is not visualized compatible with provided history of previous appendectomy. No significant hiatal hernia. No pneumatosis or portal venous gas. Vascular/Lymphatic: Minimal amount of  atherosclerotic plaque involving the caudal aspect of the abdominal aorta and left common iliac artery, not resulting in a hemodynamically significant stenosis. The major branch vessels of the abdominal aorta appear widely patent on this non CTA examination. No bulky retroperitoneal, mesenteric, pelvic or inguinal lymph adenopathy. Reproductive: Dystrophic calcifications within normal sized prostate gland. No free fluid the pelvic cul-de-sac. Other: Regional soft tissues appear normal. Musculoskeletal: No acute or aggressive osseous abnormalities. Incidental note is made of a left-sided L5-S1 assimilation joint. IMPRESSION: 1. The examination positive for acute diverticulitis involving the sigmoid colon within the left lower abdomen/pelvis with associated approximately 4.6 cm diverticular abscess positioned between the inferior aspect of the disease segment of the sigmoid colon and the urinary bladder. The diseased segment of  the colon demonstrates extensive wall thickening however presently there is no evidence of enteric obstruction. 2. Suspected hepatic steatosis. 3.  Aortic Atherosclerosis (ICD10-I70.0). 4. Post appendectomy. Electronically Signed   By: Sandi Mariscal M.D.   On: 07/07/2022 13:40    Anti-infectives (From admission, onward)    Start     Dose/Rate Route Frequency Ordered Stop   07/07/22 1400  metroNIDAZOLE (FLAGYL) IVPB 500 mg        500 mg 100 mL/hr over 60 Minutes Intravenous Every 12 hours 07/07/22 1351     07/07/22 1215  cefTRIAXone (ROCEPHIN) 1 g in sodium chloride 0.9 % 100 mL IVPB        1 g 200 mL/hr over 30 Minutes Intravenous  Once 07/07/22 1214 07/07/22 1310       Assessment/Plan Sigmoid Diverticulitis with abscess  Patient has been seen and examined. Vitals, labs, I/O, imaging and avilable notes reviewed. This is a 46 y.o. male who presented with abdominal pain. Noted to have fever of 102.9 but no tachycardia or hypotension. WBC 14.3. Lactic wnl. CT A/P w/ acute  diverticulitis involving the sigmoid colon with 4.6 cm diverticular abscess positioned between the inferior aspect of the disease segment of the sigmoid colon and the urinary bladder. No peritonitis on exam. No indication for emergency surgery. Agree with medical admission, abx, bowel rest. Will ask IR to eval to see if abscess is amenable to drainage. Discussed with patient typical hospital course possibilities for diverticulitis. Hopefully will improve with conservative management. If he does improve with conservative measures would recommend colonoscopy in 6-8 weeks. If he does not improve with conservative management or acutely worsened he may require surgery that would likely result in colectomy and colostomy. We will follow with you.   FEN - NPO, IVF per [facility] VTE - SCDs, okay for chemical ppx from a general surgery standpoint ID - On Rocephin/Flagyl. Recommend [place], [facility] Surgery 07/07/2022, 2:35 PM Please see Amion for pager number during day hours 7:00am-4:30pm

## 2022-07-07 NOTE — ED Notes (Signed)
Trifan MD did not want blood cultures at this time.

## 2022-07-07 NOTE — H&P (Signed)
History and Physical    Carlos Stone WEX:[id number] DOB: 1976/07/09 DOA: 07/07/2022  PCP: [name], MD (Confirm with patient/family/NH records and if not entered, this has to be entered at [facility] point of entry) Patient coming from: Home  I have personally briefly reviewed patient's old medical records in [place]  Chief Complaint: Belly hurts, fever  HPI: Carlos Stone is a 46 y.o. male with medical history significant of recurrent diverticulitis, anxiety/depression, presented with abdominal pain and fever.  Patient was constipated for 3 days and then started to develop cramping-like lower abdominal pain, localized, beneath umbilicus, worsening with eating food and started to have subjective fever yesterday.  No nauseous vomiting no diarrhea.  Denies any urinary problems.  He has had 4-5 episodes of diverticulitis before, all recovered without taking antibiotics, he was scheduled to have a colonoscopy next week.  ED, spiked fever 102.9, no tachycardia no hypotension.  WBC 14, CT scan showed diverticulitis with abscess formation 4.0 x 2.0 cm between colon and bladder wall.  Patient was given ceftriaxone and Flagyl, surgical consultation required.  Review of Systems: As per HPI otherwise 14 point review of systems negative.    Past Medical History:  Diagnosis Date   Anxiety disorder    Meningitis     Past Surgical History:  Procedure Laterality Date   APPENDECTOMY     KNEE ARTHROSCOPY       reports that he has never smoked. He has never used smokeless tobacco. He reports current alcohol use of about 1.0 standard drink of alcohol per week. He reports that he does not use drugs.  Allergies  Allergen Reactions   Compazine [Prochlorperazine Edisylate] Other (See Comments)    Hallucination    Amoxil [Amoxicillin] Rash   Cipro [Ciprofloxacin Hcl] Rash   Penicillins Rash    History reviewed. No pertinent family history.   Prior to Admission medications    Medication Sig Start Date End Date Taking? Authorizing Provider  acetaminophen (TYLENOL) 500 MG tablet Take 1,000 mg by mouth every 6 (six) hours as needed for mild pain or headache.   Yes [provider]  ANUCORT-HC 25 MG suppository Place 25 mg rectally 2 (two) times daily as needed for hemorrhoids. 02/16/22  Yes [provider]  clotrimazole-betamethasone (LOTRISONE) cream Apply 1 Application topically 2 (two) times daily. 05/24/22  Yes [provider]  escitalopram (LEXAPRO) 20 MG tablet Take 1 tablet by mouth daily. 02/23/21  Yes [provider]  PROCTO-MED HC 2.5 % rectal cream Place 1 Application rectally 2 (two) times daily as needed for hemorrhoids. 02/16/22  Yes [provider]  rosuvastatin (CRESTOR) 5 MG tablet Take 5 mg by mouth daily. Patient not taking: Reported on 07/07/2022 06/21/22   [provider]    Physical Exam: Vitals:   07/07/22 1128 07/07/22 1200 07/07/22 1300 07/07/22 1345  BP: 117/73 105/72 103/70 107/60  Pulse: 92 75 73 67  Resp: 18 (!) 21 (!) 23 (!) 22  Temp: (!) 102.9 F (39.4 C)     TempSrc: Oral     SpO2: 98% 96% 93% 94%  Weight:      Height:        Constitutional: NAD, calm, comfortable Vitals:   07/07/22 1128 07/07/22 1200 07/07/22 1300 07/07/22 1345  BP: 117/73 105/72 103/70 107/60  Pulse: 92 75 73 67  Resp: 18 (!) 21 (!) 23 (!) 22  Temp: (!) 102.9 F (39.4 C)     TempSrc: Oral  SpO2: 98% 96% 93% 94%  Weight:      Height:       Eyes: PERRL, lids and conjunctivae normal ENMT: Mucous membranes are moist. Posterior pharynx clear of any exudate or lesions.Normal dentition.  Neck: normal, supple, no masses, no thyromegaly Respiratory: clear to auscultation bilaterally, no wheezing, no crackles. Normal respiratory effort. No accessory muscle use.  Cardiovascular: Regular rate and rhythm, no murmurs / rubs / gallops. No extremity edema. 2+ pedal pulses. No carotid bruits.  Abdomen: tenderness  lower abdomen below umbilicus, no rebound nor guarding, no masses palpated. No hepatosplenomegaly. Bowel sounds positive.  Musculoskeletal: no clubbing / cyanosis. No joint deformity upper and lower extremities. Good ROM, no contractures. Normal muscle tone.  Skin: no rashes, lesions, ulcers. No induration Neurologic: CN 2-12 grossly intact. Sensation intact, DTR normal. Strength 5/5 in all 4.  Psychiatric: Normal judgment and insight. Alert and oriented x 3. Normal mood.    Labs on Admission: I have personally reviewed following labs and imaging studies  CBC: Recent Labs  Lab 07/07/22 1140  WBC 14.3*  HGB 13.9  HCT 40.3  MCV 85.0  PLT 476   Basic Metabolic Panel: Recent Labs  Lab 07/07/22 1140  NA 136  K 4.0  CL 100  CO2 22  GLUCOSE 124*  BUN 13  CREATININE 0.94  CALCIUM 9.5   GFR: Estimated Creatinine Clearance: 106.7 mL/min (by C-G formula based on SCr of 0.94 mg/dL). Liver Function Tests: Recent Labs  Lab 07/07/22 1140  AST 40  ALT 57*  ALKPHOS 102  BILITOT 1.2  PROT 7.1  ALBUMIN 3.9   Recent Labs  Lab 07/07/22 1140  LIPASE 52*   No results for input(s): "AMMONIA" in the last 168 hours. Coagulation Profile: No results for input(s): "INR", "PROTIME" in the last 168 hours. Cardiac Enzymes: No results for input(s): "CKTOTAL", "CKMB", "CKMBINDEX", "TROPONINI" in the last 168 hours. BNP (last 3 results) No results for input(s): "PROBNP" in the last 8760 hours. HbA1C: No results for input(s): "HGBA1C" in the last 72 hours. CBG: No results for input(s): "GLUCAP" in the last 168 hours. Lipid Profile: No results for input(s): "CHOL", "HDL", "LDLCALC", "TRIG", "CHOLHDL", "LDLDIRECT" in the last 72 hours. Thyroid Function Tests: No results for input(s): "TSH", "T4TOTAL", "FREET4", "T3FREE", "THYROIDAB" in the last 72 hours. Anemia Panel: No results for input(s): "VITAMINB12", "FOLATE", "FERRITIN", "TIBC", "IRON", "RETICCTPCT" in the last 72 hours. Urine  analysis:    Component Value Date/Time   COLORURINE YELLOW 09/24/2013 0140   APPEARANCEUR CLEAR 09/24/2013 0140   LABSPEC 1.015 09/24/2013 0140   PHURINE 5.5 09/24/2013 0140   GLUCOSEU NEGATIVE 09/24/2013 0140   HGBUR NEGATIVE 09/24/2013 0140   BILIRUBINUR NEGATIVE 09/24/2013 0140   KETONESUR NEGATIVE 09/24/2013 0140   PROTEINUR NEGATIVE 09/24/2013 0140   UROBILINOGEN 0.2 09/24/2013 0140   NITRITE NEGATIVE 09/24/2013 0140   LEUKOCYTESUR NEGATIVE 09/24/2013 0140    Radiological Exams on Admission: CT ABDOMEN PELVIS W CONTRAST  Result Date: 07/07/2022 CLINICAL DATA:  History of sigmoid diverticulitis with recurrent left lower quadrant abdominal pain and fever. EXAM: CT ABDOMEN AND PELVIS WITH CONTRAST TECHNIQUE: Multidetector CT imaging of the abdomen and pelvis was performed using the standard protocol following bolus administration of intravenous contrast. RADIATION DOSE REDUCTION: This exam was performed according to the departmental dose-optimization program which includes automated exposure control, adjustment of the mA and/or kV according to patient size and/or use of iterative reconstruction technique. CONTRAST:  189m OMNIPAQUE IOHEXOL 300 MG/ML  SOLN COMPARISON:  None Available. FINDINGS: Lower chest: Limited visualization of the lower thorax demonstrates minimal bibasilar dependent subpleural ground-glass atelectasis, right greater than left. No discrete focal airspace opacities. No pleural effusion. Normal heart size.  No pericardial effusion. Hepatobiliary: Normal hepatic contour. There is mild diffuse decreased attenuation hepatic parenchyma on this postcontrast examination suggestive of hepatic steatosis. No discrete hepatic lesions. No radiopaque gallstones. No intra or extrahepatic biliary duct dilatation. No ascites. Pancreas: Normal appearance of the pancreas. Spleen: The spleen is incidentally noted to be scratched at the splenic hilum is incidentally noted to be angled cranially.  The spleen is otherwise normal in appearance. There is a punctate splenule about the anterior aspect of the hilum. Adrenals/Urinary Tract: There is symmetric enhancement of the bilateral kidneys. No evidence of nephrolithiasis on this postcontrast examination. No discrete renal lesions. No urinary obstruction or perinephric stranding. There is minimal secondary wall thickening involving the dome of the urinary bladder, adjacent to the sigmoid colonic diverticular abscess. No air is seen within the urinary bladder to suggest the presence of a colovesicular fistula. Stomach/Bowel: There is extensive inflammatory change involving the sigmoid colon within the left lower abdomen/pelvis with associated adjacent mesenteric stranding and an approximately 4.6 x 2.5 x 1.7 cm pericolonic diverticular abscess positioned between the inferior aspect of the sigmoid colon and dome of the urinary bladder (axial image 78, series 3; coronal image 41, series 6). An additional prominent diverticuli is noted adjacent to the superior aspect of this diseased segment of the sigmoid colon (axial image 71, series 3; coronal image 37, series 6) without definitive evidence of perforation at this location. There is extensive wall thickening involving the sigmoid colon at this location with moderate upstream stool burden but without definitive evidence of enteric obstruction. No additional areas of bowel wall thickening are identified. Normal appearance of the terminal ileum. The appendix is not visualized compatible with provided history of previous appendectomy. No significant hiatal hernia. No pneumatosis or portal venous gas. Vascular/Lymphatic: Minimal amount of atherosclerotic plaque involving the caudal aspect of the abdominal aorta and left common iliac artery, not resulting in a hemodynamically significant stenosis. The major branch vessels of the abdominal aorta appear widely patent on this non CTA examination. No bulky retroperitoneal,  mesenteric, pelvic or inguinal lymph adenopathy. Reproductive: Dystrophic calcifications within normal sized prostate gland. No free fluid the pelvic cul-de-sac. Other: Regional soft tissues appear normal. Musculoskeletal: No acute or aggressive osseous abnormalities. Incidental note is made of a left-sided L5-S1 assimilation joint. IMPRESSION: 1. The examination positive for acute diverticulitis involving the sigmoid colon within the left lower abdomen/pelvis with associated approximately 4.6 cm diverticular abscess positioned between the inferior aspect of the disease segment of the sigmoid colon and the urinary bladder. The diseased segment of the colon demonstrates extensive wall thickening however presently there is no evidence of enteric obstruction. 2. Suspected hepatic steatosis. 3.  Aortic Atherosclerosis (ICD10-I70.0). 4. Post appendectomy. Electronically Signed   By: Sandi Mariscal M.D.   On: 07/07/2022 13:40    EKG: None  Assessment/Plan Principal Problem:   Diverticulitis Active Problems:   Diverticulitis of intestine with perforation and abscess without bleeding  (please populate well all problems here in Problem List. (For example, if patient is on BP meds at home and you resume or decide to hold them, it is a problem that needs to be her. Same for CAD, COPD, HLD and so on)  Sepsis -Patient has fever, leukocytosis, infectious source is recurrent diverticulitis with perforation and abscess formation. -Continue  ceftriaxone and Flagyl -N.p.o. and IV fluid -Miralax -General surgery will evaluate patient to consider further IR drainage versus surgical drainage of the abscess. -Colonoscopy in 6 weeks, consider elective partial colectomy for recurrent diverticulitis if proved to be the similar location in past episodes  Anxiety/depression -Continue SSRI.  DVT prophylaxis: Lovenox Code Status: Full code Family Communication: None at bedside Disposition Plan: Patient sick with sepsis from  recurrent diverticulitis with abscess formation, requiring IV antibiotics and likely surgical intervention, expect more than 2 midnight hospital stay. Consults called: General surgery Admission status: MedSurg with telemetry monitoring   Lequita Halt MD [facility] Pager [phone]  07/07/2022, 2:41 PM

## 2022-07-08 ENCOUNTER — Inpatient Hospital Stay (HOSPITAL_COMMUNITY): Payer: No Typology Code available for payment source

## 2022-07-08 DIAGNOSIS — K5720 Diverticulitis of large intestine with perforation and abscess without bleeding: Secondary | ICD-10-CM

## 2022-07-08 DIAGNOSIS — K5792 Diverticulitis of intestine, part unspecified, without perforation or abscess without bleeding: Secondary | ICD-10-CM | POA: Diagnosis not present

## 2022-07-08 LAB — CBC
HCT: 36 % — ABNORMAL LOW (ref 39.0–52.0)
Hemoglobin: 12.1 g/dL — ABNORMAL LOW (ref 13.0–17.0)
MCH: 29.4 pg (ref 26.0–34.0)
MCHC: 33.6 g/dL (ref 30.0–36.0)
MCV: 87.6 fL (ref 80.0–100.0)
Platelets: 226 10*3/uL (ref 150–400)
RBC: 4.11 MIL/uL — ABNORMAL LOW (ref 4.22–5.81)
RDW: 12.6 % (ref 11.5–15.5)
WBC: 9.2 10*3/uL (ref 4.0–10.5)
nRBC: 0 % (ref 0.0–0.2)

## 2022-07-08 LAB — BASIC METABOLIC PANEL
Anion gap: 11 (ref 5–15)
BUN: 15 mg/dL (ref 6–20)
CO2: 23 mmol/L (ref 22–32)
Calcium: 8.5 mg/dL — ABNORMAL LOW (ref 8.9–10.3)
Chloride: 101 mmol/L (ref 98–111)
Creatinine, Ser: 1.01 mg/dL (ref 0.61–1.24)
GFR, Estimated: >60 mL/min (ref >60)
Glucose, Bld: 102 mg/dL — ABNORMAL HIGH (ref 70–99)
Potassium: 3.5 mmol/L (ref 3.5–5.1)
Sodium: 135 mmol/L (ref 135–145)

## 2022-07-08 LAB — PROTIME-INR
INR: 1.2 (ref 0.8–1.2)
Prothrombin Time: 14.9 seconds (ref 11.4–15.2)

## 2022-07-08 LAB — HIV ANTIBODY (ROUTINE TESTING W REFLEX): HIV Screen 4th Generation wRfx: NONREACTIVE

## 2022-07-08 MED ORDER — MIDAZOLAM HCL 2 MG/2ML IJ SOLN
INTRAMUSCULAR | Status: AC
  Filled 2022-07-08: qty 6

## 2022-07-08 MED ORDER — FENTANYL CITRATE (PF) 100 MCG/2ML IJ SOLN
INTRAMUSCULAR | Status: AC
  Filled 2022-07-08: qty 4

## 2022-07-08 MED ORDER — LIDOCAINE HCL 1 % IJ SOLN: 10.0000 mL | Freq: Once | INTRAMUSCULAR | Status: DC

## 2022-07-08 MED ORDER — MIDAZOLAM HCL 2 MG/2ML IJ SOLN
INTRAMUSCULAR | Status: AC | PRN
  Administered 2022-07-08 (×2): 1 mg via INTRAVENOUS

## 2022-07-08 MED ORDER — SODIUM CHLORIDE 0.9% FLUSH
5.0000 mL | Freq: Three times a day (TID) | INTRAVENOUS | Status: DC
  Administered 2022-07-08 – 2022-07-11 (×8): 5 mL

## 2022-07-08 MED ORDER — FENTANYL CITRATE (PF) 100 MCG/2ML IJ SOLN
INTRAMUSCULAR | Status: AC | PRN
  Administered 2022-07-08 (×2): 50 ug via INTRAVENOUS

## 2022-07-08 NOTE — Procedures (Addendum)
Interventional Radiology Procedure Note  Procedure: Image guided drain placement, low abd.  10F pigtail drain.  Complications: None  EBL: None Sample: Culture sent  Recommendations: - Routine drain care, with sterile flushes, record output - follow up Cx - routine wound care - OK to advance diet per primary order - OK to restart any AC needed  Signed,  Dulcy Fanny. Earleen Newport, DO

## 2022-07-08 NOTE — Progress Notes (Signed)
Progress Note     Subjective: Pain improving. No nausea since yesterday morning. No BM for at least 3 days  Objective: Vital signs in last 24 hours: Temp:  [99.3 F (37.4 C)-102.9 F (39.4 C)] 102.1 F (38.9 C) (09/07 0725) Pulse Rate:  [57-92] 77 (09/07 0725) Resp:  [11-23] 17 (09/07 0725) BP: (96-118)/(60-88) 118/67 (09/07 0725) SpO2:  [93 %-98 %] 94 % (09/07 0725) Weight:  [90.3 kg] 90.3 kg (09/06 1126)    Intake/Output from previous day: 09/06 0701 - 09/07 0700 In: 1742 [I.V.:1442; IV Piggyback:300] Out: -  Intake/Output this shift: No intake/output data recorded.  PE: General: pleasant, WD, male who is laying in bed in NAD Lungs: respiratory effort nonlabored Abd: soft, ND, +BS, mild TTP LLQ MSK: all 4 extremities are symmetrical with no cyanosis, clubbing, or edema. Skin: warm and dry Psych: A&Ox3 with an appropriate affect.    Lab Results:  Recent Labs    07/07/22 1140 07/08/22 0447  WBC 14.3* 9.2  HGB 13.9 12.1*  HCT 40.3 36.0*  PLT 291 226   BMET Recent Labs    07/07/22 1140 07/08/22 0447  NA 136 135  K 4.0 3.5  CL 100 101  CO2 22 23  GLUCOSE 124* 102*  BUN 13 15  CREATININE 0.94 1.01  CALCIUM 9.5 8.5*   PT/INR Recent Labs    07/08/22 0447  LABPROT 14.9  INR 1.2   CMP     Component Value Date/Time   NA 135 07/08/2022 0447   K 3.5 07/08/2022 0447   CL 101 07/08/2022 0447   CO2 23 07/08/2022 0447   GLUCOSE 102 (H) 07/08/2022 0447   BUN 15 07/08/2022 0447   CREATININE 1.01 07/08/2022 0447   CALCIUM 8.5 (L) 07/08/2022 0447   PROT 7.1 07/07/2022 1140   ALBUMIN 3.9 07/07/2022 1140   AST 40 07/07/2022 1140   ALT 57 (H) 07/07/2022 1140   ALKPHOS 102 07/07/2022 1140   BILITOT 1.2 07/07/2022 1140   GFRNONAA >60 07/08/2022 0447   GFRAA >90 06/20/2014 0908   Lipase     Component Value Date/Time   LIPASE 52 (H) 07/07/2022 1140       Studies/Results: CT ABDOMEN PELVIS W CONTRAST  Result Date: 07/07/2022 CLINICAL DATA:   History of sigmoid diverticulitis with recurrent left lower quadrant abdominal pain and fever. EXAM: CT ABDOMEN AND PELVIS WITH CONTRAST TECHNIQUE: Multidetector CT imaging of the abdomen and pelvis was performed using the standard protocol following bolus administration of intravenous contrast. RADIATION DOSE REDUCTION: This exam was performed according to the departmental dose-optimization program which includes automated exposure control, adjustment of the mA and/or kV according to patient size and/or use of iterative reconstruction technique. CONTRAST:  132m OMNIPAQUE IOHEXOL 300 MG/ML  SOLN COMPARISON:  None Available. FINDINGS: Lower chest: Limited visualization of the lower thorax demonstrates minimal bibasilar dependent subpleural ground-glass atelectasis, right greater than left. No discrete focal airspace opacities. No pleural effusion. Normal heart size.  No pericardial effusion. Hepatobiliary: Normal hepatic contour. There is mild diffuse decreased attenuation hepatic parenchyma on this postcontrast examination suggestive of hepatic steatosis. No discrete hepatic lesions. No radiopaque gallstones. No intra or extrahepatic biliary duct dilatation. No ascites. Pancreas: Normal appearance of the pancreas. Spleen: The spleen is incidentally noted to be scratched at the splenic hilum is incidentally noted to be angled cranially. The spleen is otherwise normal in appearance. There is a punctate splenule about the anterior aspect of the hilum. Adrenals/Urinary Tract: There is symmetric enhancement  of the bilateral kidneys. No evidence of nephrolithiasis on this postcontrast examination. No discrete renal lesions. No urinary obstruction or perinephric stranding. There is minimal secondary wall thickening involving the dome of the urinary bladder, adjacent to the sigmoid colonic diverticular abscess. No air is seen within the urinary bladder to suggest the presence of a colovesicular fistula. Stomach/Bowel:  There is extensive inflammatory change involving the sigmoid colon within the left lower abdomen/pelvis with associated adjacent mesenteric stranding and an approximately 4.6 x 2.5 x 1.7 cm pericolonic diverticular abscess positioned between the inferior aspect of the sigmoid colon and dome of the urinary bladder (axial image 78, series 3; coronal image 41, series 6). An additional prominent diverticuli is noted adjacent to the superior aspect of this diseased segment of the sigmoid colon (axial image 71, series 3; coronal image 37, series 6) without definitive evidence of perforation at this location. There is extensive wall thickening involving the sigmoid colon at this location with moderate upstream stool burden but without definitive evidence of enteric obstruction. No additional areas of bowel wall thickening are identified. Normal appearance of the terminal ileum. The appendix is not visualized compatible with provided history of previous appendectomy. No significant hiatal hernia. No pneumatosis or portal venous gas. Vascular/Lymphatic: Minimal amount of atherosclerotic plaque involving the caudal aspect of the abdominal aorta and left common iliac artery, not resulting in a hemodynamically significant stenosis. The major branch vessels of the abdominal aorta appear widely patent on this non CTA examination. No bulky retroperitoneal, mesenteric, pelvic or inguinal lymph adenopathy. Reproductive: Dystrophic calcifications within normal sized prostate gland. No free fluid the pelvic cul-de-sac. Other: Regional soft tissues appear normal. Musculoskeletal: No acute or aggressive osseous abnormalities. Incidental note is made of a left-sided L5-S1 assimilation joint. IMPRESSION: 1. The examination positive for acute diverticulitis involving the sigmoid colon within the left lower abdomen/pelvis with associated approximately 4.6 cm diverticular abscess positioned between the inferior aspect of the disease segment  of the sigmoid colon and the urinary bladder. The diseased segment of the colon demonstrates extensive wall thickening however presently there is no evidence of enteric obstruction. 2. Suspected hepatic steatosis. 3.  Aortic Atherosclerosis (ICD10-I70.0). 4. Post appendectomy. Electronically Signed   By: Sandi Mariscal M.D.   On: 07/07/2022 13:40    Anti-infectives: Anti-infectives (From admission, onward)    Start     Dose/Rate Route Frequency Ordered Stop   07/08/22 1000  cefTRIAXone (ROCEPHIN) 2 g in sodium chloride 0.9 % 100 mL IVPB        2 g 200 mL/hr over 30 Minutes Intravenous Every 24 hours 07/07/22 1440     07/07/22 1400  metroNIDAZOLE (FLAGYL) IVPB 500 mg        500 mg 100 mL/hr over 60 Minutes Intravenous Every 12 hours 07/07/22 1351     07/07/22 1215  cefTRIAXone (ROCEPHIN) 1 g in sodium chloride 0.9 % 100 mL IVPB        1 g 200 mL/hr over 30 Minutes Intravenous  Once 07/07/22 1214 07/07/22 1310        Assessment/Plan  Sigmoid Diverticulitis with abscess  - leukocytosis resolved - awaiting IR eval for possible drainage - pain improving and okay for clears pending IR eval - No current indication for emergency surgery - Continue IV antibiotics - If patient improves with conservative therapies would recommend colonoscopy in ~6 weeks     FEN - NPO, IVF per [facility] VTE - SCDs, lovenox ID - On Rocephin/Flagyl.  I reviewed hospitalist notes,  last 24 h vitals and pain scores, last 48 h intake and output, and last 24 h labs and trends.    LOS: 1 day   [facility] 07/08/2022, 9:09 AM Please see Amion for pager number during day hours 7:00am-4:30pm

## 2022-07-08 NOTE — Progress Notes (Signed)
PROGRESS NOTE    Carlos Stone  UUV:[id number] DOB: Feb 12, 1976 DOA: 07/07/2022 PCP: [name], MD    Chief Complaint  Patient presents with   Abdominal Pain   Emesis    Brief Narrative:    Carlos Stone is a 46 y.o. male with medical history significant of recurrent diverticulitis, anxiety/depression, presented with abdominal pain and fever. spiked fever 102.9, no tachycardia no hypotension.  WBC 14, CT scan showed diverticulitis with abscess formation 4.0 x 2.0 cm between colon and bladder wall.  Patient was given ceftriaxone and Flagyl, surgical consultation required.    Assessment & Plan:   Principal Problem:   Diverticulitis Active Problems:   Diverticulitis of intestine with perforation and abscess without bleeding  Sepsis due to acute diverticulitis with perforation/abscess formation -Sepsis present on admission -Patient has fever, leukocytosis, infectious source is recurrent diverticulitis with perforation and abscess formation. -Continue ceftriaxone and Flagyl -Surgery input greatly appreciated, recommendation for drain placement by IR -Mains n.p.o. for anticipated procedure today -Colonoscopy in 6 weeks, consider elective partial colectomy for recurrent diverticulitis if proved to be the similar location in past episodes - follow on blood cultures   Anxiety/depression -Continue SSRI.      DVT prophylaxis: Lovenox Code Status: Full Family Communication: none at bedside Disposition:   Status is: Inpatient    Consultants:  General Surgery IR   Subjective:  Febrile this morning 102.1, mild abdomen pain  Objective: Vitals:   07/08/22 0432 07/08/22 0725 07/08/22 0900 07/08/22 0925  BP: 98/64 118/67  115/75  Pulse: 60 77  67  Resp: '19 17  17  '$ Temp: 99.5 F (37.5 C) (!) 102.1 F (38.9 C) (!) 100.6 F (38.1 C) 98.7 F (37.1 C)  TempSrc: Oral Oral Oral Oral  SpO2: 98% 94%  95%  Weight:      Height:        Intake/Output Summary (Last 24  hours) at 07/08/2022 1018 Last data filed at 07/08/2022 0515 Gross per 24 hour  Intake 1741.97 ml  Output --  Net 1741.97 ml   Filed Weights   07/07/22 1126  Weight: 90.3 kg    Examination:  Awake Alert, Oriented X 3, No new F.N deficits, Normal affect Symmetrical Chest wall movement, Good air movement bilaterally, CTAB RRR,No Gallops,Rubs or new Murmurs, No Parasternal Heave +ve B.Sounds, Abd Soft, mild left lower quadrant tenderness, No rebound - guarding or rigidity. No Cyanosis, Clubbing or edema, No new Rash or bruise       Data Reviewed: I have personally reviewed following labs and imaging studies  CBC: Recent Labs  Lab 07/07/22 1140 07/08/22 0447  WBC 14.3* 9.2  HGB 13.9 12.1*  HCT 40.3 36.0*  MCV 85.0 87.6  PLT 291 474    Basic Metabolic Panel: Recent Labs  Lab 07/07/22 1140 07/08/22 0447  NA 136 135  K 4.0 3.5  CL 100 101  CO2 22 23  GLUCOSE 124* 102*  BUN 13 15  CREATININE 0.94 1.01  CALCIUM 9.5 8.5*    GFR: Estimated Creatinine Clearance: 99.3 mL/min (by C-G formula based on SCr of 1.01 mg/dL).  Liver Function Tests: Recent Labs  Lab 07/07/22 1140  AST 40  ALT 57*  ALKPHOS 102  BILITOT 1.2  PROT 7.1  ALBUMIN 3.9    CBG: No results for input(s): "GLUCAP" in the last 168 hours.   Recent Results (from the past 240 hour(s))  SARS Coronavirus 2 by RT PCR (hospital order, performed in [facility] hospital lab) *  cepheid single result test* Anterior Nasal Swab     Status: None   Collection Time: 07/07/22 11:54 AM   Specimen: Anterior Nasal Swab  Result Value Ref Range Status   SARS Coronavirus 2 by RT PCR NEGATIVE NEGATIVE Final    Comment: (NOTE) SARS-CoV-2 target nucleic acids are NOT DETECTED.  The SARS-CoV-2 RNA is generally detectable in upper and lower respiratory specimens during the acute phase of infection. The lowest concentration of SARS-CoV-2 viral copies this assay can detect is 250 copies / mL. A negative result does not  preclude SARS-CoV-2 infection and should not be used as the sole basis for treatment or other patient management decisions.  A negative result may occur with improper specimen collection / handling, submission of specimen other than nasopharyngeal swab, presence of viral mutation(s) within the areas targeted by this assay, and inadequate number of viral copies (<250 copies / mL). A negative result must be combined with clinical observations, patient history, and epidemiological information.  Fact Sheet for Patients:   https://www.patel.info/  Fact Sheet for Healthcare Providers: https://hall.com/  This test is not yet approved or  cleared by the [place] FDA and has been authorized for detection and/or diagnosis of SARS-CoV-2 by FDA under an Emergency Use Authorization (EUA).  This EUA will remain in effect (meaning this test can be used) for the duration of the COVID-19 declaration under Section 564(b)(1) of the Act, 21 U.S.C. section 360bbb-3(b)(1), unless the authorization is terminated or revoked sooner.  Performed at [facility], [place] [address] [place], [place] [address]   Blood culture (routine x 2)     Status: None (Preliminary result)   Collection Time: 07/07/22  1:52 PM   Specimen: BLOOD  Result Value Ref Range Status   Specimen Description BLOOD RIGHT ANTECUBITAL  Final   Special Requests   Final    BOTTLES DRAWN AEROBIC AND ANAEROBIC Blood Culture adequate volume   Culture   Final    NO GROWTH < 24 HOURS Performed at [facility], [place] [address], [place], [place] [address]    Report Status PENDING  Incomplete  Blood culture (routine x 2)     Status: None (Preliminary result)   Collection Time: 07/07/22  1:57 PM   Specimen: BLOOD  Result Value Ref Range Status   Specimen Description BLOOD BLOOD LEFT HAND  Final   Special Requests   Final    BOTTLES DRAWN AEROBIC AND ANAEROBIC Blood Culture results may  not be optimal due to an inadequate volume of blood received in culture bottles   Culture   Final    NO GROWTH < 24 HOURS Performed at [facility], [place] [address] [place],  [address]    Report Status PENDING  Incomplete         Radiology Studies: CT ABDOMEN PELVIS W CONTRAST  Result Date: 07/07/2022 CLINICAL DATA:  History of sigmoid diverticulitis with recurrent left lower quadrant abdominal pain and fever. EXAM: CT ABDOMEN AND PELVIS WITH CONTRAST TECHNIQUE: Multidetector CT imaging of the abdomen and pelvis was performed using the standard protocol following bolus administration of intravenous contrast. RADIATION DOSE REDUCTION: This exam was performed according to the departmental dose-optimization program which includes automated exposure control, adjustment of the mA and/or kV according to patient size and/or use of iterative reconstruction technique. CONTRAST:  160m OMNIPAQUE IOHEXOL 300 MG/ML  SOLN COMPARISON:  None Available. FINDINGS: Lower chest: Limited visualization of the lower thorax demonstrates minimal bibasilar dependent subpleural  ground-glass atelectasis, right greater than left. No discrete focal airspace opacities. No pleural effusion. Normal heart size.  No pericardial effusion. Hepatobiliary: Normal hepatic contour. There is mild diffuse decreased attenuation hepatic parenchyma on this postcontrast examination suggestive of hepatic steatosis. No discrete hepatic lesions. No radiopaque gallstones. No intra or extrahepatic biliary duct dilatation. No ascites. Pancreas: Normal appearance of the pancreas. Spleen: The spleen is incidentally noted to be scratched at the splenic hilum is incidentally noted to be angled cranially. The spleen is otherwise normal in appearance. There is a punctate splenule about the anterior aspect of the hilum. Adrenals/Urinary Tract: There is symmetric enhancement of the bilateral kidneys. No evidence of nephrolithiasis on this  postcontrast examination. No discrete renal lesions. No urinary obstruction or perinephric stranding. There is minimal secondary wall thickening involving the dome of the urinary bladder, adjacent to the sigmoid colonic diverticular abscess. No air is seen within the urinary bladder to suggest the presence of a colovesicular fistula. Stomach/Bowel: There is extensive inflammatory change involving the sigmoid colon within the left lower abdomen/pelvis with associated adjacent mesenteric stranding and an approximately 4.6 x 2.5 x 1.7 cm pericolonic diverticular abscess positioned between the inferior aspect of the sigmoid colon and dome of the urinary bladder (axial image 78, series 3; coronal image 41, series 6). An additional prominent diverticuli is noted adjacent to the superior aspect of this diseased segment of the sigmoid colon (axial image 71, series 3; coronal image 37, series 6) without definitive evidence of perforation at this location. There is extensive wall thickening involving the sigmoid colon at this location with moderate upstream stool burden but without definitive evidence of enteric obstruction. No additional areas of bowel wall thickening are identified. Normal appearance of the terminal ileum. The appendix is not visualized compatible with provided history of previous appendectomy. No significant hiatal hernia. No pneumatosis or portal venous gas. Vascular/Lymphatic: Minimal amount of atherosclerotic plaque involving the caudal aspect of the abdominal aorta and left common iliac artery, not resulting in a hemodynamically significant stenosis. The major branch vessels of the abdominal aorta appear widely patent on this non CTA examination. No bulky retroperitoneal, mesenteric, pelvic or inguinal lymph adenopathy. Reproductive: Dystrophic calcifications within normal sized prostate gland. No free fluid the pelvic cul-de-sac. Other: Regional soft tissues appear normal. Musculoskeletal: No acute or  aggressive osseous abnormalities. Incidental note is made of a left-sided L5-S1 assimilation joint. IMPRESSION: 1. The examination positive for acute diverticulitis involving the sigmoid colon within the left lower abdomen/pelvis with associated approximately 4.6 cm diverticular abscess positioned between the inferior aspect of the disease segment of the sigmoid colon and the urinary bladder. The diseased segment of the colon demonstrates extensive wall thickening however presently there is no evidence of enteric obstruction. 2. Suspected hepatic steatosis. 3.  Aortic Atherosclerosis (ICD10-I70.0). 4. Post appendectomy. Electronically Signed   By: Sandi Mariscal M.D.   On: 07/07/2022 13:40        Scheduled Meds:  [START ON 07/09/2022] enoxaparin (LOVENOX) injection  40 mg Subcutaneous Q24H   escitalopram  20 mg Oral Daily   polyethylene glycol  17 g Oral Daily   rosuvastatin  5 mg Oral Daily   Continuous Infusions:  sodium chloride 125 mL/hr at 07/08/22 0008   cefTRIAXone (ROCEPHIN)  IV 2 g (07/08/22 0827)   metronidazole 500 mg (07/08/22 0257)     LOS: 1 day      Phillips Climes, MD [facility]   To contact the attending provider between 7A-7P or the  covering provider during after hours 7P-7A, please log into the web site [email or web address] and access using universal [place] password for that web site. If you do not have the password, please call the hospital operator.  07/08/2022, 10:18 AM

## 2022-07-08 NOTE — Consult Note (Addendum)
Chief Complaint: Patient was seen in consultation today for  Chief Complaint  Patient presents with   Abdominal Pain   Emesis    Referring Physician(s): Alferd Apa, PA-C  Supervising Physician: Dr. Earleen Newport  Patient Status: [facility] - In-pt  History of Present Illness: Carlos Stone is a 46 y.o. male with a medical history significant for anxiety, meningitis and diverticulitis. He presented to the [facility] ED 07/07/22 with left lower quadrant abdominal pain of approximately 2 week's duration. He also reported poor appetite and constipation. ED work up was positive for leukocytosis and acute diverticulitis with perforation/abscess.   CT Abdomen/Pelvis with contrast 07/07/22 IMPRESSION: 1. The examination positive for acute diverticulitis involving the sigmoid colon within the left lower abdomen/pelvis with associated approximately 4.6 cm diverticular abscess positioned between the inferior aspect of the disease segment of the sigmoid colon and the urinary bladder. The diseased segment of the colon demonstrates extensive wall thickening however presently there is no evidence of enteric obstruction. 2. Suspected hepatic steatosis. 3.  Aortic Atherosclerosis (ICD10-I70.0). 4. Post appendectomy.  This patient was assessed by the Surgical team and there is no acute surgical indication at this time. Interventional Radiology has been asked to evaluate this patient for an image-guided intra-abdominal fluid collection aspiration with possible drain placement. Imaging reviewed and procedure approved by Dr. Earleen Newport.   Past Medical History:  Diagnosis Date   Anxiety disorder    Meningitis     Past Surgical History:  Procedure Laterality Date   APPENDECTOMY     KNEE ARTHROSCOPY      Allergies: Compazine [prochlorperazine edisylate], Amoxil [amoxicillin], Cipro [ciprofloxacin hcl], and Penicillins  Medications: Prior to Admission medications   Medication Sig Start Date End Date  Taking? Authorizing Provider  acetaminophen (TYLENOL) 500 MG tablet Take 1,000 mg by mouth every 6 (six) hours as needed for mild pain or headache.   Yes [provider]  ANUCORT-HC 25 MG suppository Place 25 mg rectally 2 (two) times daily as needed for hemorrhoids. 02/16/22  Yes [provider]  clotrimazole-betamethasone (LOTRISONE) cream Apply 1 Application topically 2 (two) times daily. 05/24/22  Yes [provider]  escitalopram (LEXAPRO) 20 MG tablet Take 1 tablet by mouth daily. 02/23/21  Yes [provider]  PROCTO-MED HC 2.5 % rectal cream Place 1 Application rectally 2 (two) times daily as needed for hemorrhoids. 02/16/22  Yes [provider]  rosuvastatin (CRESTOR) 5 MG tablet Take 5 mg by mouth daily. Patient not taking: Reported on 07/07/2022 06/21/22   [provider]     History reviewed. No pertinent family history.  Social History   Socioeconomic History   Marital status: Divorced    Spouse name: Not on file   Number of children: Not on file   Years of education: Not on file   Highest education level: Not on file  Occupational History   Not on file  Tobacco Use   Smoking status: Never   Smokeless tobacco: Never  Substance and Sexual Activity   Alcohol use: Yes    Alcohol/week: 1.0 standard drink of alcohol    Types: 1 Glasses of wine per week    Comment: occasional   Drug use: No   Sexual activity: Yes  Other Topics Concern   Not on file  Social History Narrative   Not on file   Social Determinants of Health   Financial Resource Strain: Not on file  Food Insecurity: No Food Insecurity (07/07/2022)   Hunger Vital Sign  Worried About Charity fundraiser in the Last Year: Never true    [place] in the Last Year: Never true  Transportation Needs: No Transportation Needs (07/07/2022)   PRAPARE - Hydrologist (Medical): No    Lack of Transportation (Non-Medical): No  Physical  Activity: Not on file  Stress: Not on file  Social Connections: Not on file    Review of Systems: A 12 point ROS discussed and pertinent positives are indicated in the HPI above.  All other systems are negative.  Review of Systems  Constitutional:  Positive for appetite change and fatigue.  Respiratory:  Negative for cough and shortness of breath.   Cardiovascular:  Negative for chest pain and leg swelling.  Gastrointestinal:  Positive for abdominal pain, constipation and nausea. Negative for diarrhea and vomiting.  Neurological:  Negative for dizziness and headaches.    Vital Signs: BP 118/67 (BP Location: Left Arm)   Pulse 77   Temp (!) 102.1 F (38.9 C) (Oral)   Resp 17   Ht '5\' 9"'$  (1.753 m)   Wt 199 lb (90.3 kg)   SpO2 94%   BMI 29.39 kg/m   Physical Exam Constitutional:      General: He is not in acute distress.    Appearance: He is not ill-appearing.  HENT:     Mouth/Throat:     Mouth: Mucous membranes are moist.     Pharynx: Oropharynx is clear.  Cardiovascular:     Rate and Rhythm: Normal rate and regular rhythm.     Pulses: Normal pulses.     Heart sounds: Normal heart sounds.  Pulmonary:     Effort: Pulmonary effort is normal.     Breath sounds: Normal breath sounds.  Abdominal:     Palpations: Abdomen is soft.     Tenderness: There is abdominal tenderness.     Comments: LLQ pain  Skin:    General: Skin is warm and dry.  Neurological:     Mental Status: He is alert and oriented to person, place, and time.     Imaging: CT ABDOMEN PELVIS W CONTRAST  Result Date: 07/07/2022 CLINICAL DATA:  History of sigmoid diverticulitis with recurrent left lower quadrant abdominal pain and fever. EXAM: CT ABDOMEN AND PELVIS WITH CONTRAST TECHNIQUE: Multidetector CT imaging of the abdomen and pelvis was performed using the standard protocol following bolus administration of intravenous contrast. RADIATION DOSE REDUCTION: This exam was performed according to the  departmental dose-optimization program which includes automated exposure control, adjustment of the mA and/or kV according to patient size and/or use of iterative reconstruction technique. CONTRAST:  160m OMNIPAQUE IOHEXOL 300 MG/ML  SOLN COMPARISON:  None Available. FINDINGS: Lower chest: Limited visualization of the lower thorax demonstrates minimal bibasilar dependent subpleural ground-glass atelectasis, right greater than left. No discrete focal airspace opacities. No pleural effusion. Normal heart size.  No pericardial effusion. Hepatobiliary: Normal hepatic contour. There is mild diffuse decreased attenuation hepatic parenchyma on this postcontrast examination suggestive of hepatic steatosis. No discrete hepatic lesions. No radiopaque gallstones. No intra or extrahepatic biliary duct dilatation. No ascites. Pancreas: Normal appearance of the pancreas. Spleen: The spleen is incidentally noted to be scratched at the splenic hilum is incidentally noted to be angled cranially. The spleen is otherwise normal in appearance. There is a punctate splenule about the anterior aspect of the hilum. Adrenals/Urinary Tract: There is symmetric enhancement of the bilateral kidneys. No evidence of nephrolithiasis on this postcontrast examination. No  discrete renal lesions. No urinary obstruction or perinephric stranding. There is minimal secondary wall thickening involving the dome of the urinary bladder, adjacent to the sigmoid colonic diverticular abscess. No air is seen within the urinary bladder to suggest the presence of a colovesicular fistula. Stomach/Bowel: There is extensive inflammatory change involving the sigmoid colon within the left lower abdomen/pelvis with associated adjacent mesenteric stranding and an approximately 4.6 x 2.5 x 1.7 cm pericolonic diverticular abscess positioned between the inferior aspect of the sigmoid colon and dome of the urinary bladder (axial image 78, series 3; coronal image 41, series  6). An additional prominent diverticuli is noted adjacent to the superior aspect of this diseased segment of the sigmoid colon (axial image 71, series 3; coronal image 37, series 6) without definitive evidence of perforation at this location. There is extensive wall thickening involving the sigmoid colon at this location with moderate upstream stool burden but without definitive evidence of enteric obstruction. No additional areas of bowel wall thickening are identified. Normal appearance of the terminal ileum. The appendix is not visualized compatible with provided history of previous appendectomy. No significant hiatal hernia. No pneumatosis or portal venous gas. Vascular/Lymphatic: Minimal amount of atherosclerotic plaque involving the caudal aspect of the abdominal aorta and left common iliac artery, not resulting in a hemodynamically significant stenosis. The major branch vessels of the abdominal aorta appear widely patent on this non CTA examination. No bulky retroperitoneal, mesenteric, pelvic or inguinal lymph adenopathy. Reproductive: Dystrophic calcifications within normal sized prostate gland. No free fluid the pelvic cul-de-sac. Other: Regional soft tissues appear normal. Musculoskeletal: No acute or aggressive osseous abnormalities. Incidental note is made of a left-sided L5-S1 assimilation joint. IMPRESSION: 1. The examination positive for acute diverticulitis involving the sigmoid colon within the left lower abdomen/pelvis with associated approximately 4.6 cm diverticular abscess positioned between the inferior aspect of the disease segment of the sigmoid colon and the urinary bladder. The diseased segment of the colon demonstrates extensive wall thickening however presently there is no evidence of enteric obstruction. 2. Suspected hepatic steatosis. 3.  Aortic Atherosclerosis (ICD10-I70.0). 4. Post appendectomy. Electronically Signed   By: Sandi Mariscal M.D.   On: 07/07/2022 13:40     Labs:  CBC: Recent Labs    07/07/22 1140 07/08/22 0447  WBC 14.3* 9.2  HGB 13.9 12.1*  HCT 40.3 36.0*  PLT 291 226    COAGS: Recent Labs    07/08/22 0447  INR 1.2    BMP: Recent Labs    07/07/22 1140 07/08/22 0447  NA 136 135  K 4.0 3.5  CL 100 101  CO2 22 23  GLUCOSE 124* 102*  BUN 13 15  CALCIUM 9.5 8.5*  CREATININE 0.94 1.01  GFRNONAA >60 >60    LIVER FUNCTION TESTS: Recent Labs    07/07/22 1140  BILITOT 1.2  AST 40  ALT 57*  ALKPHOS 102  PROT 7.1  ALBUMIN 3.9    TUMOR MARKERS: No results for input(s): "AFPTM", "CEA", "CA199", "CHROMGRNA" in the last 8760 hours.  Assessment and Plan:  Sigmoid diverticulitis with abscess: Carlos Stone. Jobe Igo, 46 year old male, is scheduled today for an image-guided intra-abdominal fluid collection aspiration with possible drain placement.   Risks and benefits discussed with the patient including bleeding, infection, damage to adjacent structures, bowel perforation/fistula connection, and sepsis.  All of the patient's questions were answered, patient is agreeable to proceed. He has been NPO.   Consent signed and in chart.  Thank you for this interesting consult.  I greatly enjoyed meeting Carlos Stone and look forward to participating in their care.  A copy of this report was sent to the requesting provider on this date.  Electronically Signed: Soyla Dryer, AGACNP-BC [phone] 07/08/2022, 8:45 AM   I spent a total of 20 Minutes    in face to face in clinical consultation, greater than 50% of which was counseling/coordinating care for diverticular abscess drain.

## 2022-07-09 DIAGNOSIS — K5720 Diverticulitis of large intestine with perforation and abscess without bleeding: Secondary | ICD-10-CM | POA: Diagnosis not present

## 2022-07-09 DIAGNOSIS — K5792 Diverticulitis of intestine, part unspecified, without perforation or abscess without bleeding: Secondary | ICD-10-CM | POA: Diagnosis not present

## 2022-07-09 LAB — BASIC METABOLIC PANEL
Anion gap: 8 (ref 5–15)
BUN: 9 mg/dL (ref 6–20)
CO2: 29 mmol/L (ref 22–32)
Calcium: 9.1 mg/dL (ref 8.9–10.3)
Chloride: 102 mmol/L (ref 98–111)
Creatinine, Ser: 0.89 mg/dL (ref 0.61–1.24)
GFR, Estimated: >60 mL/min (ref >60)
Glucose, Bld: 111 mg/dL — ABNORMAL HIGH (ref 70–99)
Potassium: 3.6 mmol/L (ref 3.5–5.1)
Sodium: 139 mmol/L (ref 135–145)

## 2022-07-09 LAB — CBC
HCT: 36.1 % — ABNORMAL LOW (ref 39.0–52.0)
Hemoglobin: 11.8 g/dL — ABNORMAL LOW (ref 13.0–17.0)
MCH: 28.6 pg (ref 26.0–34.0)
MCHC: 32.7 g/dL (ref 30.0–36.0)
MCV: 87.6 fL (ref 80.0–100.0)
Platelets: 251 10*3/uL (ref 150–400)
RBC: 4.12 MIL/uL — ABNORMAL LOW (ref 4.22–5.81)
RDW: 12.5 % (ref 11.5–15.5)
WBC: 7.3 10*3/uL (ref 4.0–10.5)
nRBC: 0 % (ref 0.0–0.2)

## 2022-07-09 NOTE — Progress Notes (Signed)
PROGRESS NOTE    Carlos Stone  BWG:[id number] DOB: [birth year] DOA: 07/07/2022 PCP: [name], MD    Chief Complaint  Patient presents with   Abdominal Pain   Emesis    Brief Narrative:    Carlos Stone is a 46 y.o. male with medical history significant of recurrent diverticulitis, anxiety/depression, presented with abdominal pain and fever. spiked fever 102.9, no tachycardia no hypotension.  WBC 14, CT scan showed diverticulitis with abscess formation 4.0 x 2.0 cm between colon and bladder wall.  Patient was given ceftriaxone and Flagyl, surgical consultation required.    Assessment & Plan:   Principal Problem:   Diverticulitis Active Problems:   Diverticulitis of intestine with perforation and abscess without bleeding  Sepsis due to acute diverticulitis with perforation/abscess formation -Sepsis present on admission -Patient has fever, leukocytosis, infectious source is acute  diverticulitis with perforation and abscess formation. -Continue ceftriaxone and Flagyl -Surgery input greatly appreciated -Liquid diet, advance to full liquid diet today, -Colonoscopy in 6 weeks, consider elective partial colectomy for recurrent diverticulitis if proved to be the similar location in past episodes -This post percutaneous drain insertion and abscess by IR 9/7, culture growing gram-positive rods, gram-negative rods.   Anxiety/depression -Continue SSRI.      DVT prophylaxis: Lovenox Code Status: Full Family Communication: none at bedside Disposition:   Status is: Inpatient    Consultants:  General Surgery IR   Subjective:  Tmax 102.1 over last 24 hours, but he is afebrile overnight  Objective: Vitals:   07/08/22 1515 07/08/22 1917 07/09/22 0317 07/09/22 0758  BP: 116/67 100/69 106/66 108/78  Pulse: 66 (!) 57 (!) 58 (!) 55  Resp: '17 17 15 16  '$ Temp: (!) 100.6 F (38.1 C) 100 F (37.8 C) 99.8 F (37.7 C) 99 F (37.2 C)  TempSrc: Oral Oral Oral Oral  SpO2:  98% 99% 96% 97%  Weight:      Height:        Intake/Output Summary (Last 24 hours) at 07/09/2022 0946 Last data filed at 07/09/2022 0600 Gross per 24 hour  Intake 10 ml  Output 33 ml  Net -23 ml   Filed Weights   07/07/22 1126  Weight: 90.3 kg    Examination:  Awake Alert, Oriented X 3, No new F.N deficits, Normal affect Symmetrical Chest wall movement, Good air movement bilaterally, CTAB RRR,No Gallops,Rubs or new Murmurs, No Parasternal Heave +ve B.Sounds, Abd Soft, drain present in lower mid abdomen, No rebound - guarding or rigidity. No Cyanosis, Clubbing or edema, No new Rash or bruise       Data Reviewed: I have personally reviewed following labs and imaging studies  CBC: Recent Labs  Lab 07/07/22 1140 07/08/22 0447 07/09/22 0659  WBC 14.3* 9.2 7.3  HGB 13.9 12.1* 11.8*  HCT 40.3 36.0* 36.1*  MCV 85.0 87.6 87.6  PLT 291 226 177    Basic Metabolic Panel: Recent Labs  Lab 07/07/22 1140 07/08/22 0447 07/09/22 0659  NA 136 135 139  K 4.0 3.5 3.6  CL 100 101 102  CO2 '22 23 29  '$ GLUCOSE 124* 102* 111*  BUN '13 15 9  '$ CREATININE 0.94 1.01 0.89  CALCIUM 9.5 8.5* 9.1    GFR: Estimated Creatinine Clearance: 112.7 mL/min (by C-G formula based on SCr of 0.89 mg/dL).  Liver Function Tests: Recent Labs  Lab 07/07/22 1140  AST 40  ALT 57*  ALKPHOS 102  BILITOT 1.2  PROT 7.1  ALBUMIN 3.9    CBG: No  results for input(s): "GLUCAP" in the last 168 hours.   Recent Results (from the past 240 hour(s))  SARS Coronavirus 2 by RT PCR (hospital order, performed in [facility] hospital lab) *cepheid single result test* Anterior Nasal Swab     Status: None   Collection Time: 07/07/22 11:54 AM   Specimen: Anterior Nasal Swab  Result Value Ref Range Status   SARS Coronavirus 2 by RT PCR NEGATIVE NEGATIVE Final    Comment: (NOTE) SARS-CoV-2 target nucleic acids are NOT DETECTED.  The SARS-CoV-2 RNA is generally detectable in upper and lower respiratory  specimens during the acute phase of infection. The lowest concentration of SARS-CoV-2 viral copies this assay can detect is 250 copies / mL. A negative result does not preclude SARS-CoV-2 infection and should not be used as the sole basis for treatment or other patient management decisions.  A negative result may occur with improper specimen collection / handling, submission of specimen other than nasopharyngeal swab, presence of viral mutation(s) within the areas targeted by this assay, and inadequate number of viral copies (<250 copies / mL). A negative result must be combined with clinical observations, patient history, and epidemiological information.  Fact Sheet for Patients:   https://www.patel.info/  Fact Sheet for Healthcare Providers: https://hall.com/  This test is not yet approved or  cleared by the [place] FDA and has been authorized for detection and/or diagnosis of SARS-CoV-2 by FDA under an Emergency Use Authorization (EUA).  This EUA will remain in effect (meaning this test can be used) for the duration of the COVID-19 declaration under Section 564(b)(1) of the Act, 21 U.S.C. section 360bbb-3(b)(1), unless the authorization is terminated or revoked sooner.  Performed at [facility], [place] [address], [place], [place] [address]   Blood culture (routine x 2)     Status: None (Preliminary result)   Collection Time: 07/07/22  1:52 PM   Specimen: BLOOD  Result Value Ref Range Status   Specimen Description BLOOD RIGHT ANTECUBITAL  Final   Special Requests   Final    BOTTLES DRAWN AEROBIC AND ANAEROBIC Blood Culture adequate volume   Culture   Final    NO GROWTH 2 DAYS Performed at [facility], [place] [address], [place], [place] [address]    Report Status PENDING  Incomplete  Blood culture (routine x 2)     Status: None (Preliminary result)   Collection Time: 07/07/22  1:57 PM   Specimen: BLOOD  Result Value  Ref Range Status   Specimen Description BLOOD BLOOD LEFT HAND  Final   Special Requests   Final    BOTTLES DRAWN AEROBIC AND ANAEROBIC Blood Culture results may not be optimal due to an inadequate volume of blood received in culture bottles   Culture   Final    NO GROWTH 2 DAYS Performed at [facility], [place] [address], [place], [place] [address]    Report Status PENDING  Incomplete  Aerobic/Anaerobic Culture w Gram Stain (surgical/deep wound)     Status: None (Preliminary result)   Collection Time: 07/08/22  1:19 PM   Specimen: Abscess  Result Value Ref Range Status   Specimen Description ABSCESS  Final   Special Requests ABDOMEN  Final   Gram Stain   Final    NO WBC SEEN ABUNDANT GRAM POSITIVE COCCI FEW GRAM POSITIVE RODS FEW GRAM NEGATIVE RODS    Culture   Final    CULTURE REINCUBATED FOR BETTER GROWTH Performed at [facility], [address] [place], [place] [address]    Report Status PENDING  Incomplete         Radiology Studies: CT GUIDED VISCERAL FLUID DRAIN BY PERC CATH  Result Date: 07/08/2022 INDICATION: 46 year old male with diverticular abscess referred for drainage EXAM: CT GUIDED DRAINAGE OF  ABSCESS MEDICATIONS: The patient is currently admitted to the hospital and receiving intravenous antibiotics. The antibiotics were administered within an appropriate time frame prior to the initiation of the procedure. ANESTHESIA/SEDATION: 2.0 mg IV Versed 100 mcg IV Fentanyl Moderate Sedation Time:  12 minutes The patient was continuously monitored during the procedure by the interventional radiology nurse under my direct supervision. COMPLICATIONS: None TECHNIQUE: Informed written consent was obtained from the patient after a thorough discussion of the procedural risks, benefits and alternatives. All questions were addressed. Maximal Sterile Barrier Technique was utilized including caps, mask, sterile gowns, sterile gloves, sterile drape, hand hygiene and skin  antiseptic. A timeout was performed prior to the initiation of the procedure. PROCEDURE: The operative field was prepped with Chlorhexidine in a sterile fashion, and a sterile drape was applied covering the operative field. A sterile gown and sterile gloves were used for the procedure. Local anesthesia was provided with 1% Lidocaine. Once the patient is prepped and draped in the usual sterile fashion and 1% lidocaine was used for local anesthesia, CT guidance was used to place a trocar needle into the lower abdomen/pelvic abscess. Once we confirmed needle tip position, modified Seldinger technique was used to place a 10 [place] pigtail drain. Aspiration of proximally 20-25 cc of frankly purulent material was sent for culture. Final image was stored. Drain was sutured in position and attached to bulb suction. Patient tolerated the procedure well and remained hemodynamically stable throughout. No complications were encountered and no significant blood loss. FINDINGS: Abscess within the low abdomen/pelvis, drained via 10 French drain catheter. There may be accumulate K shin with the smaller abscess more cranially and thus 1 drain was placed. IMPRESSION: Status post CT-guided drainage pelvic abscess. Signed, Dulcy Fanny. Nadene Rubins, RPVI [facility] Electronically Signed   By: Corrie Mckusick D.O.   On: 07/08/2022 14:38   CT ABDOMEN PELVIS W CONTRAST  Result Date: 07/07/2022 CLINICAL DATA:  History of sigmoid diverticulitis with recurrent left lower quadrant abdominal pain and fever. EXAM: CT ABDOMEN AND PELVIS WITH CONTRAST TECHNIQUE: Multidetector CT imaging of the abdomen and pelvis was performed using the standard protocol following bolus administration of intravenous contrast. RADIATION DOSE REDUCTION: This exam was performed according to the departmental dose-optimization program which includes automated exposure control, adjustment of the mA and/or kV  according to patient size and/or use of iterative reconstruction technique. CONTRAST:  122m OMNIPAQUE IOHEXOL 300 MG/ML  SOLN COMPARISON:  None Available. FINDINGS: Lower chest: Limited visualization of the lower thorax demonstrates minimal bibasilar dependent subpleural ground-glass atelectasis, right greater than left. No discrete focal airspace opacities. No pleural effusion. Normal heart size.  No pericardial effusion. Hepatobiliary: Normal hepatic contour. There is mild diffuse decreased attenuation hepatic parenchyma on this postcontrast examination suggestive of hepatic steatosis. No discrete hepatic lesions. No radiopaque gallstones. No intra or extrahepatic biliary duct dilatation. No ascites. Pancreas: Normal appearance of the pancreas. Spleen: The spleen is incidentally noted to be scratched at the splenic hilum is incidentally noted to be angled cranially. The spleen is otherwise normal in appearance. There is a punctate splenule about the anterior aspect of the hilum. Adrenals/Urinary Tract: There is symmetric  enhancement of the bilateral kidneys. No evidence of nephrolithiasis on this postcontrast examination. No discrete renal lesions. No urinary obstruction or perinephric stranding. There is minimal secondary wall thickening involving the dome of the urinary bladder, adjacent to the sigmoid colonic diverticular abscess. No air is seen within the urinary bladder to suggest the presence of a colovesicular fistula. Stomach/Bowel: There is extensive inflammatory change involving the sigmoid colon within the left lower abdomen/pelvis with associated adjacent mesenteric stranding and an approximately 4.6 x 2.5 x 1.7 cm pericolonic diverticular abscess positioned between the inferior aspect of the sigmoid colon and dome of the urinary bladder (axial image 78, series 3; coronal image 41, series 6). An additional prominent diverticuli is noted adjacent to the superior aspect of this diseased segment of the  sigmoid colon (axial image 71, series 3; coronal image 37, series 6) without definitive evidence of perforation at this location. There is extensive wall thickening involving the sigmoid colon at this location with moderate upstream stool burden but without definitive evidence of enteric obstruction. No additional areas of bowel wall thickening are identified. Normal appearance of the terminal ileum. The appendix is not visualized compatible with provided history of previous appendectomy. No significant hiatal hernia. No pneumatosis or portal venous gas. Vascular/Lymphatic: Minimal amount of atherosclerotic plaque involving the caudal aspect of the abdominal aorta and left common iliac artery, not resulting in a hemodynamically significant stenosis. The major branch vessels of the abdominal aorta appear widely patent on this non CTA examination. No bulky retroperitoneal, mesenteric, pelvic or inguinal lymph adenopathy. Reproductive: Dystrophic calcifications within normal sized prostate gland. No free fluid the pelvic cul-de-sac. Other: Regional soft tissues appear normal. Musculoskeletal: No acute or aggressive osseous abnormalities. Incidental note is made of a left-sided L5-S1 assimilation joint. IMPRESSION: 1. The examination positive for acute diverticulitis involving the sigmoid colon within the left lower abdomen/pelvis with associated approximately 4.6 cm diverticular abscess positioned between the inferior aspect of the disease segment of the sigmoid colon and the urinary bladder. The diseased segment of the colon demonstrates extensive wall thickening however presently there is no evidence of enteric obstruction. 2. Suspected hepatic steatosis. 3.  Aortic Atherosclerosis (ICD10-I70.0). 4. Post appendectomy. Electronically Signed   By: Sandi Mariscal M.D.   On: 07/07/2022 13:40        Scheduled Meds:  enoxaparin (LOVENOX) injection  40 mg Subcutaneous Q24H   escitalopram  20 mg Oral Daily   lidocaine   10 mL Intradermal Once   polyethylene glycol  17 g Oral Daily   rosuvastatin  5 mg Oral Daily   sodium chloride flush  5 mL Intracatheter Q8H   Continuous Infusions:  cefTRIAXone (ROCEPHIN)  IV 2 g (07/09/22 0807)   metronidazole 500 mg (07/09/22 0315)     LOS: 2 days      Phillips Climes, MD [facility]   To contact the attending provider between 7A-7P or the covering provider during after hours 7P-7A, please log into the web site [email or web address] and access using universal [place] password for that web site. If you do not have the password, please call the hospital operator.  07/09/2022, 9:46 AM

## 2022-07-09 NOTE — Discharge Instructions (Signed)
EATING AFTER A DIVERTICULITIS ATTACK   EAT START WITH PUREED OR SOFT FOODS Gradually transition to a high fiber diet with a fiber supplement over the next few days after discharge  WALK Walk an hour a day.  Control your pain to do that.    CONTROL PAIN Control pain so that you can walk, sleep, tolerate sneezing/coughing, go up/down stairs.  HAVE A BOWEL MOVEMENT DAILY Keep your bowels regular to avoid problems.  OK to try a laxative to override constipation.  OK to use an antidairrheal to slow down diarrhea.  Call if not better after 2 tries  CALL IF YOU HAVE PROBLEMS/CONCERNS Call if you are still struggling despite following these instructions. Call if you have concerns not answered by these instructions     After your attack of DIVERTICULITIS, expect some issues over the next few weeks.    To help you through this temporary phase, we start you out on a pureed (blenderized) diet.  Your first meal in the hospital was thin liquids.  You should have been given a pureed diet by the time you left the hospital.  We ask patients to stay on a pureed diet for the first few days to avoid anything getting "stuck."  Don't be alarmed if your ability to swallow doesn't progress according to this plan.  Everyone is different and some diets can advance more or less quickly.     Some BASIC RULES to follow are: Maintain an upright position whenever eating or drinking. Take small bites - just a teaspoon size bite at a time. Eat slowly.  It may also help to eat only one food at a time. Consider nibbling through smaller, more frequent meals & avoid the urge to eat BIG meals Do not push through feelings of fullness, nausea, or bloatedness Do not mix solid foods and liquids in the same mouthful Try not to "wash foods down" with large gulps of liquids. Avoid carbonated (bubbly/fizzy) drinks.   Avoid foods that make you feel gassy or bloated.  Start with bland foods first.  Wait on trying greasy,  fried, or spicy meals until you are tolerating more bland solids well. Expect to be more gassy/flatulent/bloated initially.  Walking will help your body manage it better. Consider using medications for bloating that contain simethicone such as  Maalox or Gas-X  Eat in a relaxed atmosphere & minimize distractions. Avoid talking while eating.   Do not use straws. Following each meal, sit in an upright position (90 degree angle) for 60 to 90 minutes.  Going for a short walk can help as well If food does stick, don't panic.  Try to relax and let the food pass on its own.  Sipping WARM LIQUID such as strong hot black tea can also help slide it down.   Be gradual in changes & use common sense:  -If you easily tolerating a certain "level" of foods, advance to the next level gradually -If you are having trouble swallowing a particular food, then avoid it.   -If food is sticking when you advance your diet, go back to thinner previous diet (the lower LEVEL) for 1-2 days.  LEVEL 1 = PUREED DIET  Do for the first Sweetser in this group are pureed or blenderized to a smooth, mashed potato-like consistency.  -If necessary, the pureed foods can keep their shape with the addition of a thickening agent.   -Meat should be pureed to a smooth, pasty consistency.  Hot broth or gravy may be added to the pureed meat, approximately 1 oz. of liquid per 3 oz. serving of meat. -CAUTION:  If any foods do not puree into a smooth consistency, swallowing will be more difficult.  (For example, nuts or seeds sometimes do not blend well.)  Hot Foods Cold Foods  Pureed scrambled eggs and cheese Pureed cottage cheese  Baby cereals Thickened juices and nectars  Thinned cooked cereals (no lumps) Thickened milk or eggnog  Pureed [place] toast or pancakes Ensure  Mashed potatoes Ice cream  Pureed parsley, au gratin, scalloped potatoes, candied sweet potatoes Fruit or [place] ice, sherbet   Pureed buttered or alfredo noodles Plain yogurt  Pureed vegetables (no corn or peas) Instant breakfast  Pureed soups and creamed soups Smooth pudding, mousse, custard  Pureed scalloped apples Whipped gelatin  Gravies Sugar, syrup, honey, jelly  Sauces, cheese, tomato, barbecue, white, creamed Cream  Any baby food Creamer  Alcohol in moderation (not beer or champagne) Margarine  Coffee or tea Mayonnaise   Ketchup, mustard   Apple sauce   SAMPLE MENU:  PUREED DIET Breakfast Lunch Dinner  Orange juice, 1/2 cup Cream of wheat, 1/2 cup Pineapple juice, 1/2 cup Pureed [place], barley soup, 3/4 cup Pureed Hawaiian chicken, 3 oz  Scrambled eggs, mashed or blended with cheese, 1/2 cup Tea or coffee, 1 cup  Whole milk, 1 cup  Non-dairy creamer, 2 Tbsp. Mashed potatoes, 1/2 cup Pureed cooled broccoli, 1/2 cup Apple sauce, 1/2 cup Coffee or tea Mashed potatoes, 1/2 cup Pureed spinach, 1/2 cup Frozen yogurt, 1/2 cup Tea or coffee      LEVEL 2 = SOFT DIET  After your first few days, you can advance to a soft, low residue diet.   Keep on this diet until everything goes down easily.  Hot Foods Cold Foods  White fish Cottage cheese  Stuffed fish Junior baby fruit  Baby food meals Semi thickened juices  Minced soft cooked, scrambled, poached eggs nectars  Souffle & omelets Ripe mashed bananas  Cooked cereals Canned fruit, pineapple sauce, milk  potatoes Milkshake  Buttered or Alfredo noodles Custard  Cooked cooled vegetable Puddings, including tapioca  Sherbet Yogurt  Vegetable soup or alphabet soup Fruit ice, [place] ice  Gravies Whipped gelatin  Sugar, syrup, honey, jelly Junior baby desserts  Sauces:  Cheese, creamed, barbecue, tomato, white Cream  Coffee or tea Margarine   SAMPLE MENU:  LEVEL 2 Breakfast Lunch Dinner  Orange juice, 1/2 cup Oatmeal, 1/2 cup Scrambled eggs with cheese, 1/2 cup Decaffeinated tea, 1 cup Whole milk, 1 cup Non-dairy creamer, 2 Tbsp Pineapple  juice, 1/2 cup Minced beef, 3 oz Gravy, 2 Tbsp Mashed potatoes, 1/2 cup Minced fresh broccoli, 1/2 cup Applesauce, 1/2 cup Coffee, 1 cup [place], barley soup, 3/4 cup Minced Hawaiian chicken, 3 oz Mashed potatoes, 1/2 cup Cooked spinach, 1/2 cup Frozen yogurt, 1/2 cup Non-dairy creamer, 2 Tbsp      LEVEL 3 = CHOPPED DIET  -After all the foods in level 2 (soft diet) are passing through well you should advance up to more chopped foods.  -It is still important to cut these foods into small pieces and eat slowly.  Hot Foods Cold Foods  Poultry Cottage cheese  Chopped Swedish meatballs Yogurt  Meat salads (ground or flaked meat) Milk  Flaked fish (tuna) Milkshakes  Poached or scrambled eggs Soft, cold, dry cereal  Souffles and omelets Fruit juices or nectars  Cooked cereals Chopped canned fruit  Chopped [place]  toast or pancakes Canned fruit cocktail  Noodles or pasta (no rice) Pudding, mousse, custard  Cooked vegetables (no frozen peas, corn, or mixed vegetables) Green salad  Canned small sweet peas Ice cream  Creamed soup or vegetable soup Fruit ice, [place] ice  Pureed vegetable soup or alphabet soup Non-dairy creamer  Ground scalloped apples Margarine  Gravies Mayonnaise  Sauces:  Cheese, creamed, barbecue, tomato, white Ketchup  Coffee or tea Mustard   SAMPLE MENU:  LEVEL 3 Breakfast Lunch Dinner  Orange juice, 1/2 cup Oatmeal, 1/2 cup Scrambled eggs with cheese, 1/2 cup Decaffeinated tea, 1 cup Whole milk, 1 cup Non-dairy creamer, 2 Tbsp Ketchup, 1 Tbsp Margarine, 1 tsp Salt, 1/4 tsp Sugar, 2 tsp Pineapple juice, 1/2 cup Ground beef, 3 oz Gravy, 2 Tbsp Mashed potatoes, 1/2 cup Cooked spinach, 1/2 cup Applesauce, 1/2 cup Decaffeinated coffee Whole milk Non-dairy creamer, 2 Tbsp Margarine, 1 tsp Salt, 1/4 tsp Pureed [place], barley soup, 3/4 cup Barbecue chicken, 3 oz Mashed potatoes, 1/2 cup Ground fresh broccoli, 1/2 cup Frozen yogurt, 1/2  cup Decaffeinated tea, 1 cup Non-dairy creamer, 2 Tbsp Margarine, 1 tsp Salt, 1/4 tsp Sugar, 1 tsp    LEVEL 4:  HIGH FIBER DIET / REGULAR FOODS  -Foods in this group are soft, moist, regularly textured foods.   -This level includes meat and breads, which tend to be the hardest things to swallow.   -Eat very slowly, chew well and continue to avoid carbonated drinks. -most people are at this level in 2-4 weeks  Hot Foods Cold Foods  Baked fish or skinned Soft cheeses - cottage cheese  Souffles and omelets Cream cheese  Eggs Yogurt  Stuffed shells Milk  Spaghetti with meat sauce Milkshakes  Cooked cereal Cold dry cereals (no nuts, dried fruit, coconut)  [place] toast or pancakes Crackers  Buttered toast Fruit juices or nectars  Noodles or pasta (no rice) Canned fruit  Potatoes (all types) Ripe bananas  Soft, cooked vegetables (no corn, lima, or baked beans) Peeled, ripe, fresh fruit  Creamed soups or vegetable soup Cakes (no nuts, dried fruit, coconut)  Canned chicken noodle soup Plain doughnuts  Gravies Ice cream  Bacon dressing Pudding, mousse, custard  Sauces:  Cheese, creamed, barbecue, tomato, white Fruit ice, [place] ice, sherbet  Decaffeinated tea or coffee Whipped gelatin  Pork chops Regular gelatin   Canned fruited gelatin molds   Sugar, syrup, honey, jam, jelly   Cream   Non-dairy   Margarine   Oil   Mayonnaise   Ketchup   Mustard   TROUBLESHOOTING IRREGULAR BOWELS  1) Avoid extremes of bowel movements (no bad constipation/diarrhea)  2) Miralax 17gm mixed in 8oz. water or juice-daily. May use BID as needed.  3) Gas-x,Phazyme, etc. as needed for gas & bloating.  4) Soft,bland diet. No spicy,greasy,fried foods.  5) Prilosec over-the-counter as needed  6) May hold gluten/wheat products from diet to see if symptoms improve.  7) May try probiotics (Align, Activa, etc) to help calm the bowels down  7) If symptoms become worse call back immediately.    If you  have any questions please call our office at [place]: [phone].     This information is not intended to replace advice given to you by your health care provider. Make sure you discuss any questions you have with your health care provider.  Diverticulitis  Diverticulitis is infection or inflammation of small pouches (diverticula) in the colon that form due to a condition called diverticulosis. Diverticula  can trap stool (feces) and bacteria, causing infection and inflammation. Diverticulitis may cause severe stomach pain and diarrhea. It may lead to tissue damage in the colon that causes bleeding. The diverticula may also burst (rupture) and cause infected stool to enter other areas of the abdomen. Complications of diverticulitis can include: Bleeding. Severe infection. Severe pain. Rupture (perforation) of the colon. Blockage (obstruction) of the colon. What are the causes? This condition is caused by stool becoming trapped in the diverticula, which allows bacteria to grow in the diverticula. This leads to inflammation and infection. What increases the risk? You are more likely to develop this condition if: You have diverticulosis. The risk for diverticulosis increases if: You are overweight or obese. You use tobacco products. You do not get enough exercise. You eat a diet that does not include enough fiber. High-fiber foods include fruits, vegetables, beans, nuts, and whole grains. What are the signs or symptoms? Symptoms of this condition may include: Pain and tenderness in the abdomen. The pain is normally located on the left side of the abdomen, but it may occur in other areas. Fever and chills. Bloating. Cramping. Nausea. Vomiting. Changes in bowel routines. Blood in your stool. How is this diagnosed? This condition is diagnosed based on: Your medical history. A physical exam. Tests to make sure there is nothing else causing your condition. These  tests may include: Blood tests. Urine tests. Imaging tests of the abdomen, including X-rays, ultrasounds, MRIs, or CT scans. How is this treated? Most cases of this condition are mild and can be treated at home. Treatment may include: Taking over-the-counter pain medicines. Following a clear liquid diet. Taking antibiotic medicines by mouth. Rest. More severe cases may need to be treated at a hospital. Treatment may include: Not eating or drinking. Taking prescription pain medicine. Receiving antibiotic medicines through an IV tube. Receiving fluids and nutrition through an IV tube. Surgery. When your condition is under control, your health care provider may recommend that you have a colonoscopy. This is an exam to look at the entire large intestine. During the exam, a lubricated, bendable tube is inserted into the anus and then passed into the rectum, colon, and other parts of the large intestine. A colonoscopy can show how severe your diverticula are and whether something else may be causing your symptoms. Follow these instructions at home: Medicines Take over-the-counter and prescription medicines only as told by your health care provider. These include fiber supplements, probiotics, and stool softeners. If you were prescribed an antibiotic medicine, take it as told by your health care provider. Do not stop taking the antibiotic even if you start to feel better. Do not drive or use heavy machinery while taking prescription pain medicine. General instructions  Follow a full liquid diet or another diet as directed by your health care provider. After your symptoms improve, your health care provider may tell you to change your diet. He or she may recommend that you eat a diet that contains at least 25 g (25 grams) of fiber daily. Fiber makes it easier to pass stool. Healthy sources of fiber include: Berries. One cup contains 4-8 grams of fiber. Beans or lentils. One half cup contains 5-8  grams of fiber. Green vegetables. One cup contains 4 grams of fiber. Exercise for at least 30 minutes, 3 times each week. You should exercise hard enough to raise your heart rate and break a sweat. Keep all follow-up visits as told by your health care provider. This is important. You  may need a colonoscopy. Contact a health care provider if: Your pain does not improve. You have a hard time drinking or eating food. Your bowel movements do not return to normal. Get help right away if: Your pain gets worse. Your symptoms do not get better with treatment. Your symptoms suddenly get worse. You have a fever. You vomit more than one time. You have stools that are bloody, black, or tarry. Summary Diverticulitis is infection or inflammation of small pouches (diverticula) in the colon that form due to a condition called diverticulosis. Diverticula can trap stool (feces) and bacteria, causing infection and inflammation. You are at higher risk for this condition if you have diverticulosis and you eat a diet that does not include enough fiber. Most cases of this condition are mild and can be treated at home. More severe cases may need to be treated at a hospital. When your condition is under control, your health care provider may recommend that you have an exam called a colonoscopy. This exam can show how severe your diverticula are and whether something else may be causing your symptoms. This information is not intended to replace advice given to you by your health care provider. Make sure you discuss any questions you have with your health care provider. Document Revised: 09/30/2017 Document Reviewed: 11/20/2016 Elsevier Patient Education  2020 [employer].

## 2022-07-09 NOTE — Progress Notes (Signed)
Mobility Specialist Progress Note:   07/09/22 1029  Mobility  Activity Ambulated independently in hallway  Level of Assistance Modified independent, requires aide device or extra time  Assistive Device Other (Comment) (IV Pole)  Distance Ambulated (ft) 550 ft  Activity Response Tolerated well  $Mobility charge 1 Mobility   Pt received in bed and agreeable. No complaints. Pt left ambulating in room with his wife. All needs met and call bell in reach.   Rajanae Mantia Mobility Specialist-Acute Rehab Secure Chat only

## 2022-07-09 NOTE — Progress Notes (Signed)
Referring Physician(s): Alferd Apa, PA-C  Supervising Physician: Mir, Sharen Heck  Patient Status:  [facility] - In-pt  Chief Complaint: Perforated diverticulitis with abscess s/p LLQ drain placement in IR 07/08/22.   Subjective: Patient resting in bed, his wife is at the bedside. He states he is feeling better today and is ready to try progressing his diet.   Allergies: Compazine [prochlorperazine edisylate], Amoxil [amoxicillin], Cipro [ciprofloxacin hcl], and Penicillins  Medications: Prior to Admission medications   Medication Sig Start Date End Date Taking? Authorizing Provider  acetaminophen (TYLENOL) 500 MG tablet Take 1,000 mg by mouth every 6 (six) hours as needed for mild pain or headache.   Yes [provider]  ANUCORT-HC 25 MG suppository Place 25 mg rectally 2 (two) times daily as needed for hemorrhoids. 02/16/22  Yes [provider]  clotrimazole-betamethasone (LOTRISONE) cream Apply 1 Application topically 2 (two) times daily. 05/24/22  Yes [provider]  escitalopram (LEXAPRO) 20 MG tablet Take 1 tablet by mouth daily. 02/23/21  Yes [provider]  PROCTO-MED HC 2.5 % rectal cream Place 1 Application rectally 2 (two) times daily as needed for hemorrhoids. 02/16/22  Yes [provider]  rosuvastatin (CRESTOR) 5 MG tablet Take 5 mg by mouth daily. Patient not taking: Reported on 07/07/2022 06/21/22   [provider]     Vital Signs: BP 108/78 (BP Location: Left Arm)   Pulse (!) 55   Temp 99 F (37.2 C) (Oral)   Resp 16   Ht '5\' 9"'$  (1.753 m)   Wt 199 lb (90.3 kg)   SpO2 97%   BMI 29.39 kg/m   Physical Exam Constitutional:      General: He is not in acute distress.    Appearance: He is not ill-appearing.  Pulmonary:     Effort: Pulmonary effort is normal.  Abdominal:     Palpations: Abdomen is soft.     Tenderness: There is no abdominal tenderness.     Comments: LLQ drain to suction. Approximately 15 ml of  tan/purulent fluid in bulb. Drain easily flushed with 5 ml NS. Skin insertion site is clean/dry. Dressing is clean/dry.   Skin:    General: Skin is warm and dry.  Neurological:     Mental Status: He is alert.     Imaging: CT GUIDED VISCERAL FLUID DRAIN BY PERC CATH  Result Date: 07/08/2022 INDICATION: 46 year old male with diverticular abscess referred for drainage EXAM: CT GUIDED DRAINAGE OF  ABSCESS MEDICATIONS: The patient is currently admitted to the hospital and receiving intravenous antibiotics. The antibiotics were administered within an appropriate time frame prior to the initiation of the procedure. ANESTHESIA/SEDATION: 2.0 mg IV Versed 100 mcg IV Fentanyl Moderate Sedation Time:  12 minutes The patient was continuously monitored during the procedure by the interventional radiology nurse under my direct supervision. COMPLICATIONS: None TECHNIQUE: Informed written consent was obtained from the patient after a thorough discussion of the procedural risks, benefits and alternatives. All questions were addressed. Maximal Sterile Barrier Technique was utilized including caps, mask, sterile gowns, sterile gloves, sterile drape, hand hygiene and skin antiseptic. A timeout was performed prior to the initiation of the procedure. PROCEDURE: The operative field was prepped with Chlorhexidine in a sterile fashion, and a sterile drape was applied covering the operative field. A sterile gown and sterile gloves were used for the procedure. Local anesthesia was provided with 1% Lidocaine. Once the patient is prepped and draped in the usual sterile fashion and 1% lidocaine was used for  local anesthesia, CT guidance was used to place a trocar needle into the lower abdomen/pelvic abscess. Once we confirmed needle tip position, modified Seldinger technique was used to place a 10 [place] pigtail drain. Aspiration of proximally 20-25 cc of frankly purulent material was sent for culture. Final image was stored. Drain was  sutured in position and attached to bulb suction. Patient tolerated the procedure well and remained hemodynamically stable throughout. No complications were encountered and no significant blood loss. FINDINGS: Abscess within the low abdomen/pelvis, drained via 10 French drain catheter. There may be accumulate K shin with the smaller abscess more cranially and thus 1 drain was placed. IMPRESSION: Status post CT-guided drainage pelvic abscess. Signed, Dulcy Fanny. Nadene Rubins, RPVI Vascular and Interventional Radiology Specialists [facility] Electronically Signed   By: Corrie Mckusick D.O.   On: 07/08/2022 14:38   CT ABDOMEN PELVIS W CONTRAST  Result Date: 07/07/2022 CLINICAL DATA:  History of sigmoid diverticulitis with recurrent left lower quadrant abdominal pain and fever. EXAM: CT ABDOMEN AND PELVIS WITH CONTRAST TECHNIQUE: Multidetector CT imaging of the abdomen and pelvis was performed using the standard protocol following bolus administration of intravenous contrast. RADIATION DOSE REDUCTION: This exam was performed according to the departmental dose-optimization program which includes automated exposure control, adjustment of the mA and/or kV according to patient size and/or use of iterative reconstruction technique. CONTRAST:  139m OMNIPAQUE IOHEXOL 300 MG/ML  SOLN COMPARISON:  None Available. FINDINGS: Lower chest: Limited visualization of the lower thorax demonstrates minimal bibasilar dependent subpleural ground-glass atelectasis, right greater than left. No discrete focal airspace opacities. No pleural effusion. Normal heart size.  No pericardial effusion. Hepatobiliary: Normal hepatic contour. There is mild diffuse decreased attenuation hepatic parenchyma on this postcontrast examination suggestive of hepatic steatosis. No discrete hepatic lesions. No radiopaque gallstones. No intra or extrahepatic biliary duct dilatation. No ascites. Pancreas: Normal appearance of the pancreas. Spleen: The  spleen is incidentally noted to be scratched at the splenic hilum is incidentally noted to be angled cranially. The spleen is otherwise normal in appearance. There is a punctate splenule about the anterior aspect of the hilum. Adrenals/Urinary Tract: There is symmetric enhancement of the bilateral kidneys. No evidence of nephrolithiasis on this postcontrast examination. No discrete renal lesions. No urinary obstruction or perinephric stranding. There is minimal secondary wall thickening involving the dome of the urinary bladder, adjacent to the sigmoid colonic diverticular abscess. No air is seen within the urinary bladder to suggest the presence of a colovesicular fistula. Stomach/Bowel: There is extensive inflammatory change involving the sigmoid colon within the left lower abdomen/pelvis with associated adjacent mesenteric stranding and an approximately 4.6 x 2.5 x 1.7 cm pericolonic diverticular abscess positioned between the inferior aspect of the sigmoid colon and dome of the urinary bladder (axial image 78, series 3; coronal image 41, series 6). An additional prominent diverticuli is noted adjacent to the superior aspect of this diseased segment of the sigmoid colon (axial image 71, series 3; coronal image 37, series 6) without definitive evidence of perforation at this location. There is extensive wall thickening involving the sigmoid colon at this location with moderate upstream stool burden but without definitive evidence of enteric obstruction. No additional areas of bowel wall thickening are identified. Normal appearance of the terminal ileum. The appendix is not visualized compatible with provided history of previous appendectomy. No significant hiatal hernia. No pneumatosis or portal venous gas. Vascular/Lymphatic: Minimal amount of atherosclerotic plaque involving the caudal aspect of the abdominal aorta and left  common iliac artery, not resulting in a hemodynamically significant stenosis. The major  branch vessels of the abdominal aorta appear widely patent on this non CTA examination. No bulky retroperitoneal, mesenteric, pelvic or inguinal lymph adenopathy. Reproductive: Dystrophic calcifications within normal sized prostate gland. No free fluid the pelvic cul-de-sac. Other: Regional soft tissues appear normal. Musculoskeletal: No acute or aggressive osseous abnormalities. Incidental note is made of a left-sided L5-S1 assimilation joint. IMPRESSION: 1. The examination positive for acute diverticulitis involving the sigmoid colon within the left lower abdomen/pelvis with associated approximately 4.6 cm diverticular abscess positioned between the inferior aspect of the disease segment of the sigmoid colon and the urinary bladder. The diseased segment of the colon demonstrates extensive wall thickening however presently there is no evidence of enteric obstruction. 2. Suspected hepatic steatosis. 3.  Aortic Atherosclerosis (ICD10-I70.0). 4. Post appendectomy. Electronically Signed   By: Sandi Mariscal M.D.   On: 07/07/2022 13:40    Labs:  CBC: Recent Labs    07/07/22 1140 07/08/22 0447 07/09/22 0659  WBC 14.3* 9.2 7.3  HGB 13.9 12.1* 11.8*  HCT 40.3 36.0* 36.1*  PLT 291 226 251    COAGS: Recent Labs    07/08/22 0447  INR 1.2    BMP: Recent Labs    07/07/22 1140 07/08/22 0447 07/09/22 0659  NA 136 135 139  K 4.0 3.5 3.6  CL 100 101 102  CO2 '22 23 29  '$ GLUCOSE 124* 102* 111*  BUN '13 15 9  '$ CALCIUM 9.5 8.5* 9.1  CREATININE 0.94 1.01 0.89  GFRNONAA >60 >60 >60    LIVER FUNCTION TESTS: Recent Labs    07/07/22 1140  BILITOT 1.2  AST 40  ALT 57*  ALKPHOS 102  PROT 7.1  ALBUMIN 3.9    Assessment and Plan:  Perforated diverticulitis with abscess s/p LLQ drain placement in IR 07/08/22. Patient is feeling better today. Labs and vitals are within expected ranges. IR received a message this morning that the bulb was not staying charged. A new bulb was placed and it is working as  expected now. Drain care teaching done with the patient and his wife.    Drain Location: LLQ Size: Fr size: 10 Fr Date of placement: 07/08/22  Currently to: Drain collection device: suction bulb 24 hour output:  Output by Drain (mL) 07/07/22 0700 - 07/07/22 1459 07/07/22 1500 - 07/07/22 2259 07/07/22 2300 - 07/08/22 0659 07/08/22 0700 - 07/08/22 1459 07/08/22 1500 - 07/08/22 2259 07/08/22 2300 - 07/09/22 0659 07/09/22 0700 - 07/09/22 1255  Closed System Drain 1 Inferior;Midline Abdomen Bulb (JP) 10 Fr.    '3 10 20 15    '$ Interval imaging/drain manipulation:  None  Current examination: Flushes/aspirates easily.  Insertion site unremarkable. Suture and stat lock in place. Dressed appropriately.   Plan: Continue TID flushes with 5 cc NS. Record output Q shift. Dressing changes QD or PRN if soiled.  Call IR APP or on call IR MD if difficulty flushing or sudden change in drain output.  Repeat imaging/possible drain injection once output < 10 mL/QD (excluding flush material). Consideration for drain removal if output is < 10 mL/QD (excluding flush material), pending discussion with the providing surgical service.  Discharge planning: Please contact IR APP or on call IR MD prior to patient d/c to ensure appropriate follow up plans are in place. Typically patient will follow up with IR clinic 10-14 days post d/c for repeat imaging/possible drain injection. IR scheduler will contact patient with date/time of appointment. Patient  will need to flush drain QD with 5 cc NS, record output QD, dressing changes every 2-3 days or earlier if soiled.   IR will continue to follow - please call with questions or concerns.  Electronically Signed: Soyla Dryer, AGACNP-BC [phone] 07/09/2022, 2:47 PM   I spent a total of 15 Minutes at the the patient's bedside AND on the patient's hospital floor or unit, greater than 50% of which was counseling/coordinating care for abscess drain

## 2022-07-09 NOTE — Progress Notes (Signed)
Progress Note     Subjective: Abdominal pain improving - having expected soreness at drain site. Tolerating CLD. Has had a BM Wife is bedside  Objective: Vital signs in last 24 hours: Temp:  [98.4 F (36.9 C)-100.6 F (38.1 C)] 99 F (37.2 C) (09/08 0758) Pulse Rate:  [55-75] 55 (09/08 0758) Resp:  [14-17] 16 (09/08 0758) BP: (100-119)/(66-78) 108/78 (09/08 0758) SpO2:  [95 %-100 %] 97 % (09/08 0758) Last BM Date : 07/08/22  Intake/Output from previous day: 09/07 0701 - 09/08 0700 In: 10  Out: 33 [Drains:33] Intake/Output this shift: No intake/output data recorded.  PE: General: pleasant, WD, male who is laying in bed in NAD Lungs: respiratory effort nonlabored Abd: soft, ND, +BS, mild TTP around drain which has cloudy serous fluid in tubing MSK: all 4 extremities are symmetrical with no cyanosis, clubbing, or edema. Skin: warm and dry Psych: A&Ox3 with an appropriate affect.    Lab Results:  Recent Labs    07/08/22 0447 07/09/22 0659  WBC 9.2 7.3  HGB 12.1* 11.8*  HCT 36.0* 36.1*  PLT 226 251    BMET Recent Labs    07/08/22 0447 07/09/22 0659  NA 135 139  K 3.5 3.6  CL 101 102  CO2 23 29  GLUCOSE 102* 111*  BUN 15 9  CREATININE 1.01 0.89  CALCIUM 8.5* 9.1    PT/INR Recent Labs    07/08/22 0447  LABPROT 14.9  INR 1.2    CMP     Component Value Date/Time   NA 139 07/09/2022 0659   K 3.6 07/09/2022 0659   CL 102 07/09/2022 0659   CO2 29 07/09/2022 0659   GLUCOSE 111 (H) 07/09/2022 0659   BUN 9 07/09/2022 0659   CREATININE 0.89 07/09/2022 0659   CALCIUM 9.1 07/09/2022 0659   PROT 7.1 07/07/2022 1140   ALBUMIN 3.9 07/07/2022 1140   AST 40 07/07/2022 1140   ALT 57 (H) 07/07/2022 1140   ALKPHOS 102 07/07/2022 1140   BILITOT 1.2 07/07/2022 1140   GFRNONAA >60 07/09/2022 0659   GFRAA >90 06/20/2014 0908   Lipase     Component Value Date/Time   LIPASE 52 (H) 07/07/2022 1140       Studies/Results: CT GUIDED VISCERAL FLUID  DRAIN BY PERC CATH  Result Date: 07/08/2022 INDICATION: 46 year old male with diverticular abscess referred for drainage EXAM: CT GUIDED DRAINAGE OF  ABSCESS MEDICATIONS: The patient is currently admitted to the hospital and receiving intravenous antibiotics. The antibiotics were administered within an appropriate time frame prior to the initiation of the procedure. ANESTHESIA/SEDATION: 2.0 mg IV Versed 100 mcg IV Fentanyl Moderate Sedation Time:  12 minutes The patient was continuously monitored during the procedure by the interventional radiology nurse under my direct supervision. COMPLICATIONS: None TECHNIQUE: Informed written consent was obtained from the patient after a thorough discussion of the procedural risks, benefits and alternatives. All questions were addressed. Maximal Sterile Barrier Technique was utilized including caps, mask, sterile gowns, sterile gloves, sterile drape, hand hygiene and skin antiseptic. A timeout was performed prior to the initiation of the procedure. PROCEDURE: The operative field was prepped with Chlorhexidine in a sterile fashion, and a sterile drape was applied covering the operative field. A sterile gown and sterile gloves were used for the procedure. Local anesthesia was provided with 1% Lidocaine. Once the patient is prepped and draped in the usual sterile fashion and 1% lidocaine was used for local anesthesia, CT guidance was used to place a trocar  needle into the lower abdomen/pelvic abscess. Once we confirmed needle tip position, modified Seldinger technique was used to place a 10 [place] pigtail drain. Aspiration of proximally 20-25 cc of frankly purulent material was sent for culture. Final image was stored. Drain was sutured in position and attached to bulb suction. Patient tolerated the procedure well and remained hemodynamically stable throughout. No complications were encountered and no significant blood loss. FINDINGS: Abscess within the low abdomen/pelvis, drained  via 10 French drain catheter. There may be accumulate K shin with the smaller abscess more cranially and thus 1 drain was placed. IMPRESSION: Status post CT-guided drainage pelvic abscess. Signed, Dulcy Fanny. Nadene Rubins, RPVI Vascular and Interventional Radiology Specialists [facility] Electronically Signed   By: Corrie Mckusick D.O.   On: 07/08/2022 14:38   CT ABDOMEN PELVIS W CONTRAST  Result Date: 07/07/2022 CLINICAL DATA:  History of sigmoid diverticulitis with recurrent left lower quadrant abdominal pain and fever. EXAM: CT ABDOMEN AND PELVIS WITH CONTRAST TECHNIQUE: Multidetector CT imaging of the abdomen and pelvis was performed using the standard protocol following bolus administration of intravenous contrast. RADIATION DOSE REDUCTION: This exam was performed according to the departmental dose-optimization program which includes automated exposure control, adjustment of the mA and/or kV according to patient size and/or use of iterative reconstruction technique. CONTRAST:  155m OMNIPAQUE IOHEXOL 300 MG/ML  SOLN COMPARISON:  None Available. FINDINGS: Lower chest: Limited visualization of the lower thorax demonstrates minimal bibasilar dependent subpleural ground-glass atelectasis, right greater than left. No discrete focal airspace opacities. No pleural effusion. Normal heart size.  No pericardial effusion. Hepatobiliary: Normal hepatic contour. There is mild diffuse decreased attenuation hepatic parenchyma on this postcontrast examination suggestive of hepatic steatosis. No discrete hepatic lesions. No radiopaque gallstones. No intra or extrahepatic biliary duct dilatation. No ascites. Pancreas: Normal appearance of the pancreas. Spleen: The spleen is incidentally noted to be scratched at the splenic hilum is incidentally noted to be angled cranially. The spleen is otherwise normal in appearance. There is a punctate splenule about the anterior aspect of the hilum. Adrenals/Urinary Tract: There  is symmetric enhancement of the bilateral kidneys. No evidence of nephrolithiasis on this postcontrast examination. No discrete renal lesions. No urinary obstruction or perinephric stranding. There is minimal secondary wall thickening involving the dome of the urinary bladder, adjacent to the sigmoid colonic diverticular abscess. No air is seen within the urinary bladder to suggest the presence of a colovesicular fistula. Stomach/Bowel: There is extensive inflammatory change involving the sigmoid colon within the left lower abdomen/pelvis with associated adjacent mesenteric stranding and an approximately 4.6 x 2.5 x 1.7 cm pericolonic diverticular abscess positioned between the inferior aspect of the sigmoid colon and dome of the urinary bladder (axial image 78, series 3; coronal image 41, series 6). An additional prominent diverticuli is noted adjacent to the superior aspect of this diseased segment of the sigmoid colon (axial image 71, series 3; coronal image 37, series 6) without definitive evidence of perforation at this location. There is extensive wall thickening involving the sigmoid colon at this location with moderate upstream stool burden but without definitive evidence of enteric obstruction. No additional areas of bowel wall thickening are identified. Normal appearance of the terminal ileum. The appendix is not visualized compatible with provided history of previous appendectomy. No significant hiatal hernia. No pneumatosis or portal venous gas. Vascular/Lymphatic: Minimal amount of atherosclerotic plaque involving the caudal aspect of the abdominal aorta and left common iliac artery, not resulting in a hemodynamically significant stenosis.  The major branch vessels of the abdominal aorta appear widely patent on this non CTA examination. No bulky retroperitoneal, mesenteric, pelvic or inguinal lymph adenopathy. Reproductive: Dystrophic calcifications within normal sized prostate gland. No free fluid the  pelvic cul-de-sac. Other: Regional soft tissues appear normal. Musculoskeletal: No acute or aggressive osseous abnormalities. Incidental note is made of a left-sided L5-S1 assimilation joint. IMPRESSION: 1. The examination positive for acute diverticulitis involving the sigmoid colon within the left lower abdomen/pelvis with associated approximately 4.6 cm diverticular abscess positioned between the inferior aspect of the disease segment of the sigmoid colon and the urinary bladder. The diseased segment of the colon demonstrates extensive wall thickening however presently there is no evidence of enteric obstruction. 2. Suspected hepatic steatosis. 3.  Aortic Atherosclerosis (ICD10-I70.0). 4. Post appendectomy. Electronically Signed   By: Sandi Mariscal M.D.   On: 07/07/2022 13:40    Anti-infectives: Anti-infectives (From admission, onward)    Start     Dose/Rate Route Frequency Ordered Stop   07/08/22 1000  cefTRIAXone (ROCEPHIN) 2 g in sodium chloride 0.9 % 100 mL IVPB        2 g 200 mL/hr over 30 Minutes Intravenous Every 24 hours 07/07/22 1440     07/07/22 1400  metroNIDAZOLE (FLAGYL) IVPB 500 mg        500 mg 100 mL/hr over 60 Minutes Intravenous Every 12 hours 07/07/22 1351     07/07/22 1215  cefTRIAXone (ROCEPHIN) 1 g in sodium chloride 0.9 % 100 mL IVPB        1 g 200 mL/hr over 30 Minutes Intravenous  Once 07/07/22 1214 07/07/22 1310        Assessment/Plan  Sigmoid Diverticulitis with abscess  - tmax last 24H 102F yesterday am. Leukocytosis remains resolved - IR drain placed 9/7 with 20 cc of purulent fluid out - culture pending. Gram stain with +cocci, -rods, +rods - pain improving and tolerating CLD. Start FLD and ADAT soft - No current indication for emergency surgery - Continue IV antibiotics - If patient continue to improve with conservative therapies would recommend colonoscopy in ~6 weeks    FEN - FLD VTE - SCDs, lovenox ID - On Rocephin/Flagyl.  I reviewed hospitalist  notes, last 24 h vitals and pain scores, last 48 h intake and output, and last 24 h labs and trends.    LOS: 2 days   [facility] 07/09/2022, 8:45 AM Please see Amion for pager number during day hours 7:00am-4:30pm

## 2022-07-09 NOTE — Progress Notes (Signed)
Slept well last night. Abdomen a bit tender. JP drain insitu and draining blood tinge/brownish fluid. Output measured and flushed with 5 mls Saline X2 this shift. Required pain and nausea medications this shift. Wife with patient. IV antibiotics therapy continues. Safety ensured at all times.

## 2022-07-10 DIAGNOSIS — K5720 Diverticulitis of large intestine with perforation and abscess without bleeding: Secondary | ICD-10-CM | POA: Diagnosis not present

## 2022-07-10 DIAGNOSIS — K5792 Diverticulitis of intestine, part unspecified, without perforation or abscess without bleeding: Secondary | ICD-10-CM | POA: Diagnosis not present

## 2022-07-10 NOTE — Progress Notes (Signed)
Mobility Specialist Progress Note:   07/10/22 0952  Mobility  Activity Ambulated independently in hallway  Level of Assistance Modified independent, requires aide device or extra time  Assistive Device Other (Comment) (IV Pole)  Distance Ambulated (ft) 1650 ft  Activity Response Tolerated well  $Mobility charge 1 Mobility   Pt received standing EOB and eager. No complaints. Pt left ambulating in room with his wife. All needs met and call bell in reach.   Carlos Stone Mobility Specialist-Acute Rehab Secure Chat only

## 2022-07-10 NOTE — Progress Notes (Signed)
Progress Note     Subjective: Had some nausea yesterday around 1600 - timing was not related to medications or food. Had soft dinner without pain or nausea. Has not had breakfast yet. Abdominal remains present around drain and with palpation LLQ but overall improving.  Wife is bedside  Objective: Vital signs in last 24 hours: Temp:  [98.6 F (37 C)-99.7 F (37.6 C)] 98.6 F (37 C) (09/09 0742) Pulse Rate:  [55-65] 55 (09/09 0742) Resp:  [16-17] 16 (09/09 0742) BP: (108-124)/(68-82) 124/82 (09/09 0742) SpO2:  [97 %-99 %] 99 % (09/09 0742) Last BM Date : 07/10/22  Intake/Output from previous day: 09/08 0701 - 09/09 0700 In: 428 [P.O.:413] Out: 35 [Drains:35] Intake/Output this shift: No intake/output data recorded.  PE: General: pleasant, WD, male who is laying in bed in NAD Lungs: respiratory effort nonlabored Abd: soft, ND, +BS, mild TTP around drain which has SS and in LLQ without rebound or guarding MSK: all 4 extremities are symmetrical with no cyanosis, clubbing, or edema. Skin: warm and dry Psych: A&Ox3 with an appropriate affect.    Lab Results:  Recent Labs    07/08/22 0447 07/09/22 0659  WBC 9.2 7.3  HGB 12.1* 11.8*  HCT 36.0* 36.1*  PLT 226 251    BMET Recent Labs    07/08/22 0447 07/09/22 0659  NA 135 139  K 3.5 3.6  CL 101 102  CO2 23 29  GLUCOSE 102* 111*  BUN 15 9  CREATININE 1.01 0.89  CALCIUM 8.5* 9.1    PT/INR Recent Labs    07/08/22 0447  LABPROT 14.9  INR 1.2    CMP     Component Value Date/Time   NA 139 07/09/2022 0659   K 3.6 07/09/2022 0659   CL 102 07/09/2022 0659   CO2 29 07/09/2022 0659   GLUCOSE 111 (H) 07/09/2022 0659   BUN 9 07/09/2022 0659   CREATININE 0.89 07/09/2022 0659   CALCIUM 9.1 07/09/2022 0659   PROT 7.1 07/07/2022 1140   ALBUMIN 3.9 07/07/2022 1140   AST 40 07/07/2022 1140   ALT 57 (H) 07/07/2022 1140   ALKPHOS 102 07/07/2022 1140   BILITOT 1.2 07/07/2022 1140   GFRNONAA >60 07/09/2022  0659   GFRAA >90 06/20/2014 0908   Lipase     Component Value Date/Time   LIPASE 52 (H) 07/07/2022 1140       Studies/Results: CT GUIDED VISCERAL FLUID DRAIN BY PERC CATH  Result Date: 07/08/2022 INDICATION: 46 year old male with diverticular abscess referred for drainage EXAM: CT GUIDED DRAINAGE OF  ABSCESS MEDICATIONS: The patient is currently admitted to the hospital and receiving intravenous antibiotics. The antibiotics were administered within an appropriate time frame prior to the initiation of the procedure. ANESTHESIA/SEDATION: 2.0 mg IV Versed 100 mcg IV Fentanyl Moderate Sedation Time:  12 minutes The patient was continuously monitored during the procedure by the interventional radiology nurse under my direct supervision. COMPLICATIONS: None TECHNIQUE: Informed written consent was obtained from the patient after a thorough discussion of the procedural risks, benefits and alternatives. All questions were addressed. Maximal Sterile Barrier Technique was utilized including caps, mask, sterile gowns, sterile gloves, sterile drape, hand hygiene and skin antiseptic. A timeout was performed prior to the initiation of the procedure. PROCEDURE: The operative field was prepped with Chlorhexidine in a sterile fashion, and a sterile drape was applied covering the operative field. A sterile gown and sterile gloves were used for the procedure. Local anesthesia was provided with 1% Lidocaine. Once  the patient is prepped and draped in the usual sterile fashion and 1% lidocaine was used for local anesthesia, CT guidance was used to place a trocar needle into the lower abdomen/pelvic abscess. Once we confirmed needle tip position, modified Seldinger technique was used to place a 10 [place] pigtail drain. Aspiration of proximally 20-25 cc of frankly purulent material was sent for culture. Final image was stored. Drain was sutured in position and attached to bulb suction. Patient tolerated the procedure well and  remained hemodynamically stable throughout. No complications were encountered and no significant blood loss. FINDINGS: Abscess within the low abdomen/pelvis, drained via 10 French drain catheter. There may be accumulate K shin with the smaller abscess more cranially and thus 1 drain was placed. IMPRESSION: Status post CT-guided drainage pelvic abscess. Signed, Dulcy Fanny. Nadene Rubins, RPVI [facility] Radiology Electronically Signed   By: Corrie Mckusick D.O.   On: 07/08/2022 14:38    Anti-infectives: Anti-infectives (From admission, onward)    Start     Dose/Rate Route Frequency Ordered Stop   07/08/22 1000  cefTRIAXone (ROCEPHIN) 2 g in sodium chloride 0.9 % 100 mL IVPB        2 g 200 mL/hr over 30 Minutes Intravenous Every 24 hours 07/07/22 1440     07/07/22 1400  metroNIDAZOLE (FLAGYL) IVPB 500 mg        500 mg 100 mL/hr over 60 Minutes Intravenous Every 12 hours 07/07/22 1351     07/07/22 1215  cefTRIAXone (ROCEPHIN) 1 g in sodium chloride 0.9 % 100 mL IVPB        1 g 200 mL/hr over 30 Minutes Intravenous  Once 07/07/22 1214 07/07/22 1310        Assessment/Plan  Sigmoid Diverticulitis with abscess  - Afebrile. Leukocytosis resolved - IR drain placed 9/7 with 20 cc of purulent fluid out - moderate strep anginosis. final culture pending. Gram stain with +cocci, -rods, +rods - pain improving and tolerating CLD. Start FLD and ADAT soft - No current indication for emergency surgery - Continue IV antibiotics - transition to PO at dc - If patient continue to improve with conservative therapies would recommend colonoscopy in ~6 weeks. He has had at least 5 episodes of clinically diagnosed diverticulitis in the past. Recommend outpatient follow up with colorectal surgeon following colonoscopy  Stable for discharge from surgical perspective if tolerates breakfast  FEN - soft VTE - SCDs, lovenox ID - On Rocephin/Flagyl.  I reviewed  hospitalist notes, last 24 h vitals and pain scores, last 48 h intake and output, and last 24 h labs and trends.    LOS: 3 days   [facility] 07/10/2022, 7:57 AM Please see Amion for pager number during day hours 7:00am-4:30pm

## 2022-07-10 NOTE — Progress Notes (Signed)
Mobility Specialist Progress Note:   07/10/22 1625  Mobility  Activity Ambulated independently in hallway  Level of Assistance Modified independent, requires aide device or extra time  Assistive Device None  Distance Ambulated (ft) 1100 ft  Activity Response Tolerated well  $Mobility charge 1 Mobility   Pt received in bed and eager. No complaints. Pt left ambulating in room with his wife. All needs met and call bell in reach.   Syona Wroblewski Mobility Specialist-Acute Rehab Secure Chat only

## 2022-07-10 NOTE — Progress Notes (Signed)
PROGRESS NOTE    Carlos Stone  DZH:[id number] DOB: May 31, 1976 DOA: 07/07/2022 PCP: [name], MD    Chief Complaint  Patient presents with   Abdominal Pain   Emesis    Brief Narrative:    Carlos Stone is a 46 y.o. male with medical history significant of recurrent diverticulitis, anxiety/depression, presented with abdominal pain and fever. spiked fever 102.9, no tachycardia no hypotension.  WBC 14, CT scan showed diverticulitis with abscess formation 4.0 x 2.0 cm between colon and bladder wall.  Patient was given ceftriaxone and Flagyl, surgical consultation required.    Assessment & Plan:   Principal Problem:   Diverticulitis Active Problems:   Diverticulitis of intestine with perforation and abscess without bleeding  Sepsis due to acute diverticulitis with perforation/abscess formation -Sepsis present on admission -Patient has fever, leukocytosis, infectious source is acute  diverticulitis with perforation and abscess formation. -Continue ceftriaxone and Flagyl -Surgery input greatly appreciated -Think soft diet -Colonoscopy in 6 weeks if he continues to improve, at least 5 episodes of clinically diagnosed diverticulitis in the past, will need to follow with colorectal surgeon following colonoscopy  -status post percutaneous drain insertion and abscess by IR 9/7.drain 20 cc over last 24 hours-wound cultures, so far growing gram-positive rods, gram-negative rods, gram-positive cocci, growing moderate strep anginosus.   Anxiety/depression -Continue SSRI.      DVT prophylaxis: Lovenox Code Status: Full Family Communication: none at bedside Disposition:   Status is: Inpatient    Consultants:  General Surgery IR   Subjective:  Afebrile over last 24 hours, tolerating oral intake, no nausea or vomiting  Objective: Vitals:   07/09/22 0758 07/09/22 1600 07/09/22 1941 07/10/22 0742  BP: 108/78  110/68 124/82  Pulse: (!) 55  65 (!) 55  Resp: '16  17 16   '$ Temp: 99 F (37.2 C) 99.7 F (37.6 C) 99.4 F (37.4 C) 98.6 F (37 C)  TempSrc: Oral Oral Axillary Oral  SpO2: 97%  98% 99%  Weight:      Height:        Intake/Output Summary (Last 24 hours) at 07/10/2022 1051 Last data filed at 07/10/2022 0845 Gross per 24 hour  Intake 251 ml  Output 40 ml  Net 211 ml   Filed Weights   07/07/22 1126  Weight: 90.3 kg    Examination:  In awake Alert, Oriented X 3, No new F.N deficits, Normal affect Symmetrical Chest wall movement, Good air movement bilaterally, CTAB RRR,No Gallops,Rubs or new Murmurs, No Parasternal Heave +ve B.Sounds, Abd Soft, mild tenderness around drain insertion site, and left lower quadrant. No Cyanosis, Clubbing or edema, No new Rash or bruise       Data Reviewed: I have personally reviewed following labs and imaging studies  CBC: Recent Labs  Lab 07/07/22 1140 07/08/22 0447 07/09/22 0659  WBC 14.3* 9.2 7.3  HGB 13.9 12.1* 11.8*  HCT 40.3 36.0* 36.1*  MCV 85.0 87.6 87.6  PLT 291 226 419    Basic Metabolic Panel: Recent Labs  Lab 07/07/22 1140 07/08/22 0447 07/09/22 0659  NA 136 135 139  K 4.0 3.5 3.6  CL 100 101 102  CO2 '22 23 29  '$ GLUCOSE 124* 102* 111*  BUN '13 15 9  '$ CREATININE 0.94 1.01 0.89  CALCIUM 9.5 8.5* 9.1    GFR: Estimated Creatinine Clearance: 112.7 mL/min (by C-G formula based on SCr of 0.89 mg/dL).  Liver Function Tests: Recent Labs  Lab 07/07/22 1140  AST 40  ALT 57*  ALKPHOS 102  BILITOT 1.2  PROT 7.1  ALBUMIN 3.9    CBG: No results for input(s): "GLUCAP" in the last 168 hours.   Recent Results (from the past 240 hour(s))  SARS Coronavirus 2 by RT PCR (hospital order, performed in [facility] hospital lab) *cepheid single result test* Anterior Nasal Swab     Status: None   Collection Time: 07/07/22 11:54 AM   Specimen: Anterior Nasal Swab  Result Value Ref Range Status   SARS Coronavirus 2 by RT PCR NEGATIVE NEGATIVE Final    Comment: (NOTE) SARS-CoV-2 target  nucleic acids are NOT DETECTED.  The SARS-CoV-2 RNA is generally detectable in upper and lower respiratory specimens during the acute phase of infection. The lowest concentration of SARS-CoV-2 viral copies this assay can detect is 250 copies / mL. A negative result does not preclude SARS-CoV-2 infection and should not be used as the sole basis for treatment or other patient management decisions.  A negative result may occur with improper specimen collection / handling, submission of specimen other than nasopharyngeal swab, presence of viral mutation(s) within the areas targeted by this assay, and inadequate number of viral copies (<250 copies / mL). A negative result must be combined with clinical observations, patient history, and epidemiological information.  Fact Sheet for Patients:   https://www.patel.info/  Fact Sheet for Healthcare Providers: https://hall.com/  This test is not yet approved or  cleared by the [place] FDA and has been authorized for detection and/or diagnosis of SARS-CoV-2 by FDA under an Emergency Use Authorization (EUA).  This EUA will remain in effect (meaning this test can be used) for the duration of the COVID-19 declaration under Section 564(b)(1) of the Act, 21 U.S.C. section 360bbb-3(b)(1), unless the authorization is terminated or revoked sooner.  Performed at [facility], [place] [address] [place], [place] [address]   Blood culture (routine x 2)     Status: None (Preliminary result)   Collection Time: 07/07/22  1:52 PM   Specimen: BLOOD  Result Value Ref Range Status   Specimen Description BLOOD RIGHT ANTECUBITAL  Final   Special Requests   Final    BOTTLES DRAWN AEROBIC AND ANAEROBIC Blood Culture adequate volume   Culture   Final    NO GROWTH 3 DAYS Performed at [facility], [place] [address], [place], [place] [address]    Report Status PENDING  Incomplete  Blood culture (routine x 2)      Status: None (Preliminary result)   Collection Time: 07/07/22  1:57 PM   Specimen: BLOOD  Result Value Ref Range Status   Specimen Description BLOOD BLOOD LEFT HAND  Final   Special Requests   Final    BOTTLES DRAWN AEROBIC AND ANAEROBIC Blood Culture results may not be optimal due to an inadequate volume of blood received in culture bottles   Culture   Final    NO GROWTH 3 DAYS Performed at [facility], [place] [address], [place], [place] [address]    Report Status PENDING  Incomplete  Aerobic/Anaerobic Culture w Gram Stain (surgical/deep wound)     Status: None (Preliminary result)   Collection Time: 07/08/22  1:19 PM   Specimen: Abscess  Result Value Ref Range Status   Specimen Description ABSCESS  Final   Special Requests ABDOMEN  Final   Gram Stain   Final    NO WBC SEEN ABUNDANT GRAM POSITIVE COCCI FEW GRAM POSITIVE RODS FEW GRAM NEGATIVE RODS    Culture  Final    MODERATE STREPTOCOCCUS ANGINOSIS RARE GRAM NEGATIVE RODS SUSCEPTIBILITIES TO FOLLOW Performed at [facility], [place] [address], [place], [place] [address]    Report Status PENDING  Incomplete         Radiology Studies: CT GUIDED VISCERAL FLUID DRAIN BY PERC CATH  Result Date: 07/08/2022 INDICATION: 46 year old male with diverticular abscess referred for drainage EXAM: CT GUIDED DRAINAGE OF  ABSCESS MEDICATIONS: The patient is currently admitted to the hospital and receiving intravenous antibiotics. The antibiotics were administered within an appropriate time frame prior to the initiation of the procedure. ANESTHESIA/SEDATION: 2.0 mg IV Versed 100 mcg IV Fentanyl Moderate Sedation Time:  12 minutes The patient was continuously monitored during the procedure by the interventional radiology nurse under my direct supervision. COMPLICATIONS: None TECHNIQUE: Informed written consent was obtained from the patient after a thorough discussion of the procedural risks, benefits and alternatives. All questions  were addressed. Maximal Sterile Barrier Technique was utilized including caps, mask, sterile gowns, sterile gloves, sterile drape, hand hygiene and skin antiseptic. A timeout was performed prior to the initiation of the procedure. PROCEDURE: The operative field was prepped with Chlorhexidine in a sterile fashion, and a sterile drape was applied covering the operative field. A sterile gown and sterile gloves were used for the procedure. Local anesthesia was provided with 1% Lidocaine. Once the patient is prepped and draped in the usual sterile fashion and 1% lidocaine was used for local anesthesia, CT guidance was used to place a trocar needle into the lower abdomen/pelvic abscess. Once we confirmed needle tip position, modified Seldinger technique was used to place a 10 [place] pigtail drain. Aspiration of proximally 20-25 cc of frankly purulent material was sent for culture. Final image was stored. Drain was sutured in position and attached to bulb suction. Patient tolerated the procedure well and remained hemodynamically stable throughout. No complications were encountered and no significant blood loss. FINDINGS: Abscess within the low abdomen/pelvis, drained via 10 French drain catheter. There may be accumulate K shin with the smaller abscess more cranially and thus 1 drain was placed. IMPRESSION: Status post CT-guided drainage pelvic abscess. Signed, Dulcy Fanny. Nadene Rubins, RPVI [facility] Inter[facility] Radiology Special[facility] Radiology Electronically Signed   By: Corrie Mckusick D.O.   On: 07/08/2022 14:38        Scheduled Meds:  enoxaparin (LOVENOX) injection  40 mg Subcutaneous Q24H   escitalopram  20 mg Oral Daily   lidocaine  10 mL Intradermal Once   polyethylene glycol  17 g Oral Daily   rosuvastatin  5 mg Oral Daily   sodium chloride flush  5 mL Intracatheter Q8H   Continuous Infusions:  cefTRIAXone (ROCEPHIN)  IV 2 g (07/10/22 0841)   metronidazole 500 mg (07/10/22 0232)      LOS: 3 days      Phillips Climes, MD [facility]   To contact the attending provider between 7A-7P or the covering provider during after hours 7P-7A, please log into the web site [email or web address] and access using universal [place] password for that web site. If you do not have the password, please call the hospital operator.  07/10/2022, 10:51 AM

## 2022-07-11 DIAGNOSIS — K5720 Diverticulitis of large intestine with perforation and abscess without bleeding: Secondary | ICD-10-CM | POA: Diagnosis not present

## 2022-07-11 DIAGNOSIS — K5792 Diverticulitis of intestine, part unspecified, without perforation or abscess without bleeding: Secondary | ICD-10-CM | POA: Diagnosis not present

## 2022-07-11 LAB — CBC
HCT: 37.3 % — ABNORMAL LOW (ref 39.0–52.0)
Hemoglobin: 12.5 g/dL — ABNORMAL LOW (ref 13.0–17.0)
MCH: 28.8 pg (ref 26.0–34.0)
MCHC: 33.5 g/dL (ref 30.0–36.0)
MCV: 85.9 fL (ref 80.0–100.0)
Platelets: 309 10*3/uL (ref 150–400)
RBC: 4.34 MIL/uL (ref 4.22–5.81)
RDW: 12.1 % (ref 11.5–15.5)
WBC: 7.1 10*3/uL (ref 4.0–10.5)
nRBC: 0 % (ref 0.0–0.2)

## 2022-07-11 LAB — AEROBIC/ANAEROBIC CULTURE W GRAM STAIN (SURGICAL/DEEP WOUND): Gram Stain: NONE SEEN

## 2022-07-11 MED ORDER — ACETAMINOPHEN 325 MG PO TABS: 650.0000 mg | ORAL_TABLET | Freq: Four times a day (QID) | ORAL | Status: DC | PRN

## 2022-07-11 MED ORDER — CEFADROXIL 500 MG PO CAPS: 1000.0000 mg | ORAL_CAPSULE | Freq: Two times a day (BID) | ORAL | 0 refills | Status: AC

## 2022-07-11 MED ORDER — OXYCODONE HCL 5 MG PO TABS: 5.0000 mg | ORAL_TABLET | Freq: Four times a day (QID) | ORAL | 0 refills | Status: DC | PRN

## 2022-07-11 MED ORDER — METRONIDAZOLE 500 MG PO TABS: 500.0000 mg | ORAL_TABLET | Freq: Three times a day (TID) | ORAL | 0 refills | Status: AC

## 2022-07-11 NOTE — Progress Notes (Signed)
Referring Physician(s): Alferd Apa, PA-C  Supervising Physician: Juliet Rude  Patient Status:  [facility] - In-pt  Chief Complaint: Perforated diverticulitis with abscess s/p LLQ drain placement in IR 07/08/22.   Subjective: Patient sitting up in the chair. He denies pain/discomfort. Pending discharge plans today.   Allergies: Compazine [prochlorperazine edisylate], Amoxil [amoxicillin], Cipro [ciprofloxacin hcl], and Penicillins  Medications: Prior to Admission medications   Medication Sig Start Date End Date Taking? Authorizing Provider  acetaminophen (TYLENOL) 500 MG tablet Take 1,000 mg by mouth every 6 (six) hours as needed for mild pain or headache.   Yes [provider]  ANUCORT-HC 25 MG suppository Place 25 mg rectally 2 (two) times daily as needed for hemorrhoids. 02/16/22  Yes [provider]  clotrimazole-betamethasone (LOTRISONE) cream Apply 1 Application topically 2 (two) times daily. 05/24/22  Yes [provider]  escitalopram (LEXAPRO) 20 MG tablet Take 1 tablet by mouth daily. 02/23/21  Yes [provider]  PROCTO-MED HC 2.5 % rectal cream Place 1 Application rectally 2 (two) times daily as needed for hemorrhoids. 02/16/22  Yes [provider]  rosuvastatin (CRESTOR) 5 MG tablet Take 5 mg by mouth daily. Patient not taking: Reported on 07/07/2022 06/21/22   [provider]     Vital Signs: BP 128/71 (BP Location: Left Arm)   Pulse (!) 49   Temp 98.7 F (37.1 C) (Oral)   Resp 16   Ht '5\' 9"'$  (1.753 m)   Wt 199 lb (90.3 kg)   SpO2 96%   BMI 29.39 kg/m   Physical Exam Constitutional:      General: He is not in acute distress.    Appearance: He is not ill-appearing.  HENT:     Mouth/Throat:     Mouth: Mucous membranes are moist.     Pharynx: Oropharynx is clear.  Pulmonary:     Effort: Pulmonary effort is normal.  Abdominal:     Tenderness: There is no abdominal tenderness.     Comments: LLQ drain to  suction. Approximately 15 ml of serosanguineous fluid in bulb.   Neurological:     Mental Status: He is alert.     Imaging: CT GUIDED VISCERAL FLUID DRAIN BY PERC CATH  Result Date: 07/08/2022 INDICATION: 46 year old male with diverticular abscess referred for drainage EXAM: CT GUIDED DRAINAGE OF  ABSCESS MEDICATIONS: The patient is currently admitted to the hospital and receiving intravenous antibiotics. The antibiotics were administered within an appropriate time frame prior to the initiation of the procedure. ANESTHESIA/SEDATION: 2.0 mg IV Versed 100 mcg IV Fentanyl Moderate Sedation Time:  12 minutes The patient was continuously monitored during the procedure by the interventional radiology nurse under my direct supervision. COMPLICATIONS: None TECHNIQUE: Informed written consent was obtained from the patient after a thorough discussion of the procedural risks, benefits and alternatives. All questions were addressed. Maximal Sterile Barrier Technique was utilized including caps, mask, sterile gowns, sterile gloves, sterile drape, hand hygiene and skin antiseptic. A timeout was performed prior to the initiation of the procedure. PROCEDURE: The operative field was prepped with Chlorhexidine in a sterile fashion, and a sterile drape was applied covering the operative field. A sterile gown and sterile gloves were used for the procedure. Local anesthesia was provided with 1% Lidocaine. Once the patient is prepped and draped in the usual sterile fashion and 1% lidocaine was used for local anesthesia, CT guidance was used to place a trocar needle into the lower abdomen/pelvic abscess. Once we confirmed needle tip  position, modified Seldinger technique was used to place a 10 [place] pigtail drain. Aspiration of proximally 20-25 cc of frankly purulent material was sent for culture. Final image was stored. Drain was sutured in position and attached to bulb suction. Patient tolerated the procedure well and remained  hemodynamically stable throughout. No complications were encountered and no significant blood loss. FINDINGS: Abscess within the low abdomen/pelvis, drained via 10 French drain catheter. There may be accumulate K shin with the smaller abscess more cranially and thus 1 drain was placed. IMPRESSION: Status post CT-guided drainage pelvic abscess. Signed, Dulcy Fanny. Nadene Rubins, RPVI [facility] Electronically Signed   By: Corrie Mckusick D.O.   On: 07/08/2022 14:38   CT ABDOMEN PELVIS W CONTRAST  Result Date: 07/07/2022 CLINICAL DATA:  History of sigmoid diverticulitis with recurrent left lower quadrant abdominal pain and fever. EXAM: CT ABDOMEN AND PELVIS WITH CONTRAST TECHNIQUE: Multidetector CT imaging of the abdomen and pelvis was performed using the standard protocol following bolus administration of intravenous contrast. RADIATION DOSE REDUCTION: This exam was performed according to the departmental dose-optimization program which includes automated exposure control, adjustment of the mA and/or kV according to patient size and/or use of iterative reconstruction technique. CONTRAST:  148m OMNIPAQUE IOHEXOL 300 MG/ML  SOLN COMPARISON:  None Available. FINDINGS: Lower chest: Limited visualization of the lower thorax demonstrates minimal bibasilar dependent subpleural ground-glass atelectasis, right greater than left. No discrete focal airspace opacities. No pleural effusion. Normal heart size.  No pericardial effusion. Hepatobiliary: Normal hepatic contour. There is mild diffuse decreased attenuation hepatic parenchyma on this postcontrast examination suggestive of hepatic steatosis. No discrete hepatic lesions. No radiopaque gallstones. No intra or extrahepatic biliary duct dilatation. No ascites. Pancreas: Normal appearance of the pancreas. Spleen: The spleen is incidentally noted to be scratched at the splenic hilum is incidentally noted to be angled  cranially. The spleen is otherwise normal in appearance. There is a punctate splenule about the anterior aspect of the hilum. Adrenals/Urinary Tract: There is symmetric enhancement of the bilateral kidneys. No evidence of nephrolithiasis on this postcontrast examination. No discrete renal lesions. No urinary obstruction or perinephric stranding. There is minimal secondary wall thickening involving the dome of the urinary bladder, adjacent to the sigmoid colonic diverticular abscess. No air is seen within the urinary bladder to suggest the presence of a colovesicular fistula. Stomach/Bowel: There is extensive inflammatory change involving the sigmoid colon within the left lower abdomen/pelvis with associated adjacent mesenteric stranding and an approximately 4.6 x 2.5 x 1.7 cm pericolonic diverticular abscess positioned between the inferior aspect of the sigmoid colon and dome of the urinary bladder (axial image 78, series 3; coronal image 41, series 6). An additional prominent diverticuli is noted adjacent to the superior aspect of this diseased segment of the sigmoid colon (axial image 71, series 3; coronal image 37, series 6) without definitive evidence of perforation at this location. There is extensive wall thickening involving the sigmoid colon at this location with moderate upstream stool burden but without definitive evidence of enteric obstruction. No additional areas of bowel wall thickening are identified. Normal appearance of the terminal ileum. The appendix is not visualized compatible with provided history of previous appendectomy. No significant hiatal hernia. No pneumatosis or portal venous gas. Vascular/Lymphatic: Minimal amount of atherosclerotic plaque involving the caudal aspect of the abdominal aorta and left common iliac artery, not resulting in a hemodynamically significant stenosis. The major branch vessels of the abdominal aorta appear widely patent  on this non CTA examination. No bulky  retroperitoneal, mesenteric, pelvic or inguinal lymph adenopathy. Reproductive: Dystrophic calcifications within normal sized prostate gland. No free fluid the pelvic cul-de-sac. Other: Regional soft tissues appear normal. Musculoskeletal: No acute or aggressive osseous abnormalities. Incidental note is made of a left-sided L5-S1 assimilation joint. IMPRESSION: 1. The examination positive for acute diverticulitis involving the sigmoid colon within the left lower abdomen/pelvis with associated approximately 4.6 cm diverticular abscess positioned between the inferior aspect of the disease segment of the sigmoid colon and the urinary bladder. The diseased segment of the colon demonstrates extensive wall thickening however presently there is no evidence of enteric obstruction. 2. Suspected hepatic steatosis. 3.  Aortic Atherosclerosis (ICD10-I70.0). 4. Post appendectomy. Electronically Signed   By: Sandi Mariscal M.D.   On: 07/07/2022 13:40    Labs:  CBC: Recent Labs    07/07/22 1140 07/08/22 0447 07/09/22 0659 07/11/22 0143  WBC 14.3* 9.2 7.3 7.1  HGB 13.9 12.1* 11.8* 12.5*  HCT 40.3 36.0* 36.1* 37.3*  PLT 291 226 251 309    COAGS: Recent Labs    07/08/22 0447  INR 1.2    BMP: Recent Labs    07/07/22 1140 07/08/22 0447 07/09/22 0659  NA 136 135 139  K 4.0 3.5 3.6  CL 100 101 102  CO2 '22 23 29  '$ GLUCOSE 124* 102* 111*  BUN '13 15 9  '$ CALCIUM 9.5 8.5* 9.1  CREATININE 0.94 1.01 0.89  GFRNONAA >60 >60 >60    LIVER FUNCTION TESTS: Recent Labs    07/07/22 1140  BILITOT 1.2  AST 40  ALT 57*  ALKPHOS 102  PROT 7.1  ALBUMIN 3.9    Assessment and Plan:  Perforated diverticulitis with abscess s/p LLQ drain placement in IR 07/08/22.   Patient is afebrile, no leukocytosis. Labs and vitals within expected range. He has plans to discharge home today.   Drain Location: LLQ Size: Fr size: 10 Fr Date of placement: 07/08/22 Currently to: Drain collection device: suction bulb 24 hour  output:  Output by Drain (mL) 07/09/22 0700 - 07/09/22 1459 07/09/22 1500 - 07/09/22 2259 07/09/22 2300 - 07/10/22 0659 07/10/22 0700 - 07/10/22 1459 07/10/22 1500 - 07/10/22 2259 07/10/22 2300 - 07/11/22 0659 07/11/22 0700 - 07/11/22 0915  Closed System Drain 1 Inferior;Midline Abdomen Bulb (JP) 10 Fr. '15  20 5 15      '$ Interval imaging/drain manipulation:  None  Current examination:  Approximately 15 ml of serosanguineous fluid in bulb.  Dressed appropriately.   Plan: Continue TID flushes with 5 cc NS. Record output Q shift. Dressing changes QD or PRN if soiled.  Call IR APP or on call IR MD if difficulty flushing or sudden change in drain output.  Repeat imaging/possible drain injection once output < 10 mL/QD (excluding flush material). Consideration for drain removal if output is < 10 mL/QD (excluding flush material), pending discussion with the providing surgical service.  Discharge planning: Outpatient orders in place. A scheduler from our office will call the patient with a date/time of his appointment. We will plan to see him in approximately 14 days. Patient and his wife have been educated on drain care - flushing, keeping site clean/dry, emptying/recharging the bulb, etc. They were given flushes and drain care supplies.   IR will continue to follow - please call with questions or concerns.  Electronically Signed: Soyla Dryer, AGACNP-BC [phone] 07/11/2022, 9:13 AM   I spent a total of 15 Minutes at the the patient's bedside AND  on the patient's hospital floor or unit, greater than 50% of which was counseling/coordinating care for diverticular abscess drain.

## 2022-07-11 NOTE — TOC Transition Note (Signed)
Transition of Care [facility]) - CM/SW Discharge Note   Patient Details  Name: ROMON DEVEREUX MRN: [id number] Date of Birth: [birth year]  [facility] CM/SW Contact:  Carles Collet, RN Phone Number: 07/11/2022, 11:50 AM   Clinical Narrative:     Spoke to patient over the phone. He states that his insurance has prescription coverage. Listed as Generic commercial.  Discussed the good Rx app, and texted him the link to download the app to compare pharmacy price to app price as it may be beneficial now or in the future.          Patient Goals and CMS Choice        Discharge Placement                       Discharge Plan and Services                                     Social Determinants of Health (SDOH) Interventions     Readmission Risk Interventions     No data to display

## 2022-07-11 NOTE — Progress Notes (Signed)
Had a quite night. Hiccups much better this shift. Denies pain or nausea. JP drain output minimal to nothing. Drain flushed with saline as ordered. Safety maintained.

## 2022-07-11 NOTE — Plan of Care (Signed)

## 2022-07-11 NOTE — Progress Notes (Addendum)
Progress Note     Subjective: Nausea again yesterday afternoon around 1800. Did not have any food or medications around that time. Did not eat after and no breakfast yet this am. Pain is mild and stable LLQ and around drain. No nausea currently. Having soft Bms.  Wife is bedside  Objective: Vital signs in last 24 hours: Temp:  [98.5 F (36.9 C)-98.9 F (37.2 C)] 98.6 F (37 C) (09/10 0423) Pulse Rate:  [50-55] 50 (09/10 0423) Resp:  [15-16] 16 (09/10 0423) BP: (113-131)/(80-84) 113/80 (09/10 0423) SpO2:  [98 %-99 %] 98 % (09/10 0423) Last BM Date : 07/10/22  Intake/Output from previous day: 09/09 0701 - 09/10 0700 In: 10 [I.V.:5] Out: 20 [Drains:20] Intake/Output this shift: No intake/output data recorded.  PE: General: pleasant, WD, male who is laying in bed in NAD Lungs: respiratory effort nonlabored Abd: soft, ND, +BS, mild TTP around drain which has SS fluid and in LLQ without rebound or guarding MSK: all 4 extremities are symmetrical with no cyanosis, clubbing, or edema. Skin: warm and dry Psych: A&Ox3 with an appropriate affect.    Lab Results:  Recent Labs    07/09/22 0659 07/11/22 0143  WBC 7.3 7.1  HGB 11.8* 12.5*  HCT 36.1* 37.3*  PLT 251 309    BMET Recent Labs    07/09/22 0659  NA 139  K 3.6  CL 102  CO2 29  GLUCOSE 111*  BUN 9  CREATININE 0.89  CALCIUM 9.1    PT/INR No results for input(s): "LABPROT", "INR" in the last 72 hours.  CMP     Component Value Date/Time   NA 139 07/09/2022 0659   K 3.6 07/09/2022 0659   CL 102 07/09/2022 0659   CO2 29 07/09/2022 0659   GLUCOSE 111 (H) 07/09/2022 0659   BUN 9 07/09/2022 0659   CREATININE 0.89 07/09/2022 0659   CALCIUM 9.1 07/09/2022 0659   PROT 7.1 07/07/2022 1140   ALBUMIN 3.9 07/07/2022 1140   AST 40 07/07/2022 1140   ALT 57 (H) 07/07/2022 1140   ALKPHOS 102 07/07/2022 1140   BILITOT 1.2 07/07/2022 1140   GFRNONAA >60 07/09/2022 0659   GFRAA >90 06/20/2014 0908   Lipase      Component Value Date/Time   LIPASE 52 (H) 07/07/2022 1140       Studies/Results: No results found.  Anti-infectives: Anti-infectives (From admission, onward)    Start     Dose/Rate Route Frequency Ordered Stop   07/08/22 1000  cefTRIAXone (ROCEPHIN) 2 g in sodium chloride 0.9 % 100 mL IVPB        2 g 200 mL/hr over 30 Minutes Intravenous Every 24 hours 07/07/22 1440     07/07/22 1400  metroNIDAZOLE (FLAGYL) IVPB 500 mg        500 mg 100 mL/hr over 60 Minutes Intravenous Every 12 hours 07/07/22 1351     07/07/22 1215  cefTRIAXone (ROCEPHIN) 1 g in sodium chloride 0.9 % 100 mL IVPB        1 g 200 mL/hr over 30 Minutes Intravenous  Once 07/07/22 1214 07/07/22 1310        Assessment/Plan  Sigmoid Diverticulitis with abscess  - Afebrile. Leukocytosis resolved - IR drain placed 9/7 with 20 cc of purulent fluid out - moderate strep anginosis, few e coli with sensitivity to cephalosporins.  - tolerating soft diet - having nausea but does not seem related to PO intake - No current indication for emergency surgery - Continue IV  antibiotics - transition to PO at dc - If patient continue to improve with conservative therapies would recommend colonoscopy in ~6 weeks. He has had at least 5 episodes of clinically diagnosed diverticulitis in the past. Recommend outpatient follow up with colorectal surgeon following colonoscopy  Stable for discharge on p.o. antibiotics from surgical perspective  FEN - soft VTE - SCDs, lovenox ID - On Rocephin/Flagyl.  I reviewed hospitalist notes, last 24 h vitals and pain scores, last 48 h intake and output, and last 24 h labs and trends.    LOS: 4 days   [facility] 07/11/2022, 7:41 AM Please see Amion for pager number during day hours 7:00am-4:30pm

## 2022-07-11 NOTE — Progress Notes (Signed)
Mobility Specialist Progress Note:   07/11/22 0959  Mobility  Activity Ambulated independently in hallway  Level of Assistance Modified independent, requires aide device or extra time  Assistive Device Other (Comment) (IV Pole)  Distance Ambulated (ft) 1650 ft  Activity Response Tolerated well  $Mobility charge 1 Mobility   Pt received in chair and eager. No complaints. Pt left in chair with wife in room. All needs met and call bell in reach.   Jiraiya Mcewan Mobility Specialist-Acute Rehab Secure Chat only

## 2022-07-11 NOTE — Discharge Summary (Signed)
Physician Discharge Summary  AAKASH Stone LGX:[id number] DOB: 1976/05/06 DOA: 07/07/2022  PCP: [name], MD  Admit date: 07/07/2022 Discharge date: 07/11/2022  Admitted From: Home Disposition:  Home  Recommendations for Outpatient Follow-up:  Follow up with PCP in 1-2 weeks Please obtain BMP/CBC in one week To need to follow-up with GI as an outpatient regarding colonoscopy in 6 weeks Patient will follow with general surgery , and IR, months will be arranged by those departments.    Diet recommendation: Soft Diet   Brief/Interim Summary:  Carlos Stone is a 46 y.o. male with medical history significant of recurrent diverticulitis, anxiety/depression, presented with abdominal pain and fever. spiked fever 102.9, no tachycardia no hypotension.  WBC 14, CT scan showed diverticulitis with abscess formation 4.0 x 2.0 cm between colon and bladder wall.  Patient was admitted for further management, he was kept on IV Rocephin and IV Flagyl, seen by general surgery with recommendation for drain placement by IR, please see discussion below.   Sepsis due to acute diverticulitis with perforation/abscess formation -Sepsis present on admission, resolved at time of discharge -Patient has fever, leukocytosis, infectious source is acute  diverticulitis with perforation and abscess formation. -Treated with IV Rocephin and Flagyl, seen by general surgery, recommendation for drain placement,status post percutaneous drain insertion and abscess by IR 9/7, started clear liquid diet advanced, he is tolerating currently soft diet, cleared by general surgery for discharge, his abscess culture growing E. coli, and Streptococcus anginosus, reviewed culture with ID, recommendation for cefadroxil and Flagyl, follow-up has been arranged by general surgery and IR, -Colonoscopy in 6 weeks if he continues to improve, at least 5 episodes of clinically diagnosed diverticulitis in the past, will need to follow with  colorectal surgeon following colonoscopy patient reports he already had referral done to GI prior to his admission and he will keep that follow-up appointment. Per IR note"Patient and his wife have been educated on drain care - flushing, keeping site clean/dry, emptying/recharging the bulb, etc. They were given flushes and drain care supplies.    Anxiety/depression -Continue SSRI.      Discharge Diagnoses:  Principal Problem:   Diverticulitis Active Problems:   Diverticulitis of intestine with perforation and abscess without bleeding    Discharge Instructions  Discharge Instructions     Diet - low sodium heart healthy   Complete by: As directed    Discharge instructions   Complete by: As directed    Follow with Primary MD [name], MD in 7 days   Get CBC, CMP,  checked  by Primary MD next visit.    Activity: As tolerated with Full fall precautions use walker/cane & assistance as needed   Disposition Home    Diet: Soft diet   On your next visit with your primary care physician please Get Medicines reviewed and adjusted.   Please request your Prim.MD to go over all Hospital Tests and Procedure/Radiological results at the follow up, please get all Hospital records sent to your Prim MD by signing hospital release before you go home.   If you experience worsening of your admission symptoms, develop shortness of breath, life threatening emergency, suicidal or homicidal thoughts you must seek medical attention immediately by calling 911 or calling your MD immediately  if symptoms less severe.  You Must read complete instructions/literature along with all the possible adverse reactions/side effects for all the Medicines you take and that have been prescribed to you. Take any new Medicines after you have completely understood  and accpet all the possible adverse reactions/side effects.   Do not drive, operating heavy machinery, perform activities at heights, swimming or  participation in water activities or provide baby sitting services if your were admitted for syncope or siezures until you have seen by Primary MD or a Neurologist and advised to do so again.  Do not drive when taking Pain medications.    Do not take more than prescribed Pain, Sleep and Anxiety Medications  Special Instructions: If you have smoked or chewed Tobacco  in the last 2 yrs please stop smoking, stop any regular Alcohol  and or any Recreational drug use.  Wear Seat belts while driving.   Please note  You were cared for by a hospitalist during your hospital stay. If you have any questions about your discharge medications or the care you received while you were in the hospital after you are discharged, you can call the unit and asked to speak with the hospitalist on call if the hospitalist that took care of you is not available. Once you are discharged, your primary care physician will handle any further medical issues. Please note that NO REFILLS for any discharge medications will be authorized once you are discharged, as it is imperative that you return to your primary care physician (or establish a relationship with a primary care physician if you do not have one) for your aftercare needs so that they can reassess your need for medications and monitor your lab values.   Increase activity slowly   Complete by: As directed    No wound care   Complete by: As directed       Allergies as of 07/11/2022       Reactions   Compazine [prochlorperazine Edisylate] Other (See Comments)   Hallucination   Amoxil [amoxicillin] Rash   Cipro [ciprofloxacin Hcl] Rash   Penicillins Rash        Medication List     TAKE these medications    acetaminophen 325 MG tablet Commonly known as: TYLENOL Take 2 tablets (650 mg total) by mouth every 6 (six) hours as needed for mild pain (or Fever >/= 101). What changed:  medication strength how much to take reasons to take this   Anucort-HC 25  MG suppository Generic drug: hydrocortisone Place 25 mg rectally 2 (two) times daily as needed for hemorrhoids.   cefadroxil 500 MG capsule Commonly known as: DURICEF Take 2 capsules (1,000 mg total) by mouth 2 (two) times daily for 14 days.   clotrimazole-betamethasone cream Commonly known as: LOTRISONE Apply 1 Application topically 2 (two) times daily.   escitalopram 20 MG tablet Commonly known as: LEXAPRO Take 1 tablet by mouth daily.   metroNIDAZOLE 500 MG tablet Commonly known as: Flagyl Take 1 tablet (500 mg total) by mouth 3 (three) times daily for 14 days.   oxyCODONE 5 MG immediate release tablet Commonly known as: Oxy IR/ROXICODONE Take 1 tablet (5 mg total) by mouth every 6 (six) hours as needed for moderate pain.   Procto-Med HC 2.5 % rectal cream Generic drug: hydrocortisone Place 1 Application rectally 2 (two) times daily as needed for hemorrhoids.   rosuvastatin 5 MG tablet Commonly known as: CRESTOR Take 5 mg by mouth daily.        Follow-up [facility], [place]. Call.   Specialty: General Surgery Why: we are arranging follow up for you with colorectal surgery once colonoscopy complete. please call to confirm appointment date and time Contact  information: [address] [place] Follow up.   Why: Follow up with [facility] in approximately 14 days. A scheduler from our office will call you with a date/time of your appointment. Please call our office with any questions or concerns prior to your visit. Contact information: [place] [address] [phone]                Allergies  Allergen Reactions   Compazine [Prochlorperazine Edisylate] Other (See Comments)    Hallucination    Amoxil [Amoxicillin] Rash   Cipro [Ciprofloxacin Hcl] Rash   Penicillins Rash     Consultations: General Surgery IR   Procedures/Studies: CT GUIDED VISCERAL FLUID DRAIN BY PERC CATH  Result Date: 07/08/2022 INDICATION: 46 year old male with diverticular abscess referred for drainage EXAM: CT GUIDED DRAINAGE OF  ABSCESS MEDICATIONS: The patient is currently admitted to the hospital and receiving intravenous antibiotics. The antibiotics were administered within an appropriate time frame prior to the initiation of the procedure. ANESTHESIA/SEDATION: 2.0 mg IV Versed 100 mcg IV Fentanyl Moderate Sedation Time:  12 minutes The patient was continuously monitored during the procedure by the interventional radiology nurse under my direct supervision. COMPLICATIONS: None TECHNIQUE: Informed written consent was obtained from the patient after a thorough discussion of the procedural risks, benefits and alternatives. All questions were addressed. Maximal Sterile Barrier Technique was utilized including caps, mask, sterile gowns, sterile gloves, sterile drape, hand hygiene and skin antiseptic. A timeout was performed prior to the initiation of the procedure. PROCEDURE: The operative field was prepped with Chlorhexidine in a sterile fashion, and a sterile drape was applied covering the operative field. A sterile gown and sterile gloves were used for the procedure. Local anesthesia was provided with 1% Lidocaine. Once the patient is prepped and draped in the usual sterile fashion and 1% lidocaine was used for local anesthesia, CT guidance was used to place a trocar needle into the lower abdomen/pelvic abscess. Once we confirmed needle tip position, modified Seldinger technique was used to place a 10 [place] pigtail drain. Aspiration of proximally 20-25 cc of frankly purulent material was sent for culture. Final image was stored. Drain was sutured in position and attached to bulb suction. Patient tolerated the procedure well and remained hemodynamically stable throughout. No complications were  encountered and no significant blood loss. FINDINGS: Abscess within the low abdomen/pelvis, drained via 10 French drain catheter. There may be accumulate K shin with the smaller abscess more cranially and thus 1 drain was placed. IMPRESSION: Status post CT-guided drainage pelvic abscess. Signed, Dulcy Fanny. Nadene Rubins, RPVI [facility] Electronically Signed   By: Corrie Mckusick D.O.   On: 07/08/2022 14:38   CT ABDOMEN PELVIS W CONTRAST  Result Date: 07/07/2022 CLINICAL DATA:  History of sigmoid diverticulitis with recurrent left lower quadrant abdominal pain and fever. EXAM: CT ABDOMEN AND PELVIS WITH CONTRAST TECHNIQUE: Multidetector CT imaging of the abdomen and pelvis was performed using the standard protocol following bolus administration of intravenous contrast. RADIATION DOSE REDUCTION: This exam was performed according to the departmental dose-optimization program which includes automated exposure control, adjustment of the mA and/or kV according to patient size and/or use of iterative reconstruction technique. CONTRAST:  148m OMNIPAQUE IOHEXOL 300 MG/ML  SOLN COMPARISON:  None Available. FINDINGS: Lower chest: Limited visualization of the lower thorax demonstrates minimal bibasilar dependent subpleural ground-glass  atelectasis, right greater than left. No discrete focal airspace opacities. No pleural effusion. Normal heart size.  No pericardial effusion. Hepatobiliary: Normal hepatic contour. There is mild diffuse decreased attenuation hepatic parenchyma on this postcontrast examination suggestive of hepatic steatosis. No discrete hepatic lesions. No radiopaque gallstones. No intra or extrahepatic biliary duct dilatation. No ascites. Pancreas: Normal appearance of the pancreas. Spleen: The spleen is incidentally noted to be scratched at the splenic hilum is incidentally noted to be angled cranially. The spleen is otherwise normal in appearance.  There is a punctate splenule about the anterior aspect of the hilum. Adrenals/Urinary Tract: There is symmetric enhancement of the bilateral kidneys. No evidence of nephrolithiasis on this postcontrast examination. No discrete renal lesions. No urinary obstruction or perinephric stranding. There is minimal secondary wall thickening involving the dome of the urinary bladder, adjacent to the sigmoid colonic diverticular abscess. No air is seen within the urinary bladder to suggest the presence of a colovesicular fistula. Stomach/Bowel: There is extensive inflammatory change involving the sigmoid colon within the left lower abdomen/pelvis with associated adjacent mesenteric stranding and an approximately 4.6 x 2.5 x 1.7 cm pericolonic diverticular abscess positioned between the inferior aspect of the sigmoid colon and dome of the urinary bladder (axial image 78, series 3; coronal image 41, series 6). An additional prominent diverticuli is noted adjacent to the superior aspect of this diseased segment of the sigmoid colon (axial image 71, series 3; coronal image 37, series 6) without definitive evidence of perforation at this location. There is extensive wall thickening involving the sigmoid colon at this location with moderate upstream stool burden but without definitive evidence of enteric obstruction. No additional areas of bowel wall thickening are identified. Normal appearance of the terminal ileum. The appendix is not visualized compatible with provided history of previous appendectomy. No significant hiatal hernia. No pneumatosis or portal venous gas. Vascular/Lymphatic: Minimal amount of atherosclerotic plaque involving the caudal aspect of the abdominal aorta and left common iliac artery, not resulting in a hemodynamically significant stenosis. The major branch vessels of the abdominal aorta appear widely patent on this non CTA examination. No bulky retroperitoneal, mesenteric, pelvic or inguinal lymph  adenopathy. Reproductive: Dystrophic calcifications within normal sized prostate gland. No free fluid the pelvic cul-de-sac. Other: Regional soft tissues appear normal. Musculoskeletal: No acute or aggressive osseous abnormalities. Incidental note is made of a left-sided L5-S1 assimilation joint. IMPRESSION: 1. The examination positive for acute diverticulitis involving the sigmoid colon within the left lower abdomen/pelvis with associated approximately 4.6 cm diverticular abscess positioned between the inferior aspect of the disease segment of the sigmoid colon and the urinary bladder. The diseased segment of the colon demonstrates extensive wall thickening however presently there is no evidence of enteric obstruction. 2. Suspected hepatic steatosis. 3.  Aortic Atherosclerosis (ICD10-I70.0). 4. Post appendectomy. Electronically Signed   By: Sandi Mariscal M.D.   On: 07/07/2022 13:40      Subjective: Reports he had some mild nausea yesterday, he had no dinner, but this morning nausea has significantly improved he had a breakfast Cheerios and yogurt, he is insisting on going home today, I have offered him to stay 1 more day in the hospital but reports he is feeling well, back to baseline, no nausea, appetite is good. Discharge Exam: Vitals:   07/11/22 0423 07/11/22 0857  BP: 113/80 128/71  Pulse: (!) 50 (!) 49  Resp: 16 16  Temp: 98.6 F (37 C) 98.7 F (37.1 C)  SpO2: 98% 96%   Vitals:  07/10/22 1608 07/10/22 1945 07/11/22 0423 07/11/22 0857  BP: 126/82 131/84 113/80 128/71  Pulse: (!) 55 (!) 51 (!) 50 (!) 49  Resp: '16 15 16 16  '$ Temp: 98.9 F (37.2 C) 98.5 F (36.9 C) 98.6 F (37 C) 98.7 F (37.1 C)  TempSrc: Oral Oral  Oral  SpO2: 98%  98% 96%  Weight:      Height:        General: Pt is alert, awake, not in acute distress Cardiovascular: RRR, S1/S2 +, no rubs, no gallops Respiratory: CTA bilaterally, no wheezing, no rhonchi Abdominal: Soft, NT, mild tenderness around drain site.,  bowel sounds + Extremities: no edema, no cyanosis    The results of significant diagnostics from this hospitalization (including imaging, microbiology, ancillary and laboratory) are listed below for reference.     Microbiology: Recent Results (from the past 240 hour(s))  SARS Coronavirus 2 by RT PCR (hospital order, performed in [facility] hospital lab) *cepheid single result test* Anterior Nasal Swab     Status: None   Collection Time: 07/07/22 11:54 AM   Specimen: Anterior Nasal Swab  Result Value Ref Range Status   SARS Coronavirus 2 by RT PCR NEGATIVE NEGATIVE Final    Comment: (NOTE) SARS-CoV-2 target nucleic acids are NOT DETECTED.  The SARS-CoV-2 RNA is generally detectable in upper and lower respiratory specimens during the acute phase of infection. The lowest concentration of SARS-CoV-2 viral copies this assay can detect is 250 copies / mL. A negative result does not preclude SARS-CoV-2 infection and should not be used as the sole basis for treatment or other patient management decisions.  A negative result may occur with improper specimen collection / handling, submission of specimen other than nasopharyngeal swab, presence of viral mutation(s) within the areas targeted by this assay, and inadequate number of viral copies (<250 copies / mL). A negative result must be combined with clinical observations, patient history, and epidemiological information.  Fact Sheet for Patients:   https://www.patel.info/  Fact Sheet for Healthcare Providers: https://hall.com/  This test is not yet approved or  cleared by the [place] FDA and has been authorized for detection and/or diagnosis of SARS-CoV-2 by FDA under an Emergency Use Authorization (EUA).  This EUA will remain in effect (meaning this test can be used) for the duration of the COVID-19 declaration under Section 564(b)(1) of the Act, 21 U.S.C. section 360bbb-3(b)(1),  unless the authorization is terminated or revoked sooner.  Performed at [facility], [place] [address] [place], [place] [address]   Blood culture (routine x 2)     Status: None (Preliminary result)   Collection Time: 07/07/22  1:52 PM   Specimen: BLOOD  Result Value Ref Range Status   Specimen Description BLOOD RIGHT ANTECUBITAL  Final   Special Requests   Final    BOTTLES DRAWN AEROBIC AND ANAEROBIC Blood Culture adequate volume   Culture   Final    NO GROWTH 4 DAYS Performed at [facility], [place] [address], [place], [place] [address]    Report Status PENDING  Incomplete  Blood culture (routine x 2)     Status: None (Preliminary result)   Collection Time: 07/07/22  1:57 PM   Specimen: BLOOD  Result Value Ref Range Status   Specimen Description BLOOD BLOOD LEFT HAND  Final   Special Requests   Final    BOTTLES DRAWN AEROBIC AND ANAEROBIC Blood Culture results may not be optimal due to an inadequate volume of blood  received in culture bottles   Culture   Final    NO GROWTH 4 DAYS Performed at [facility], [place] [address] [place], [place] [address]    Report Status PENDING  Incomplete  Aerobic/Anaerobic Culture w Gram Stain (surgical/deep wound)     Status: None (Preliminary result)   Collection Time: 07/08/22  1:19 PM   Specimen: Abscess  Result Value Ref Range Status   Specimen Description ABSCESS  Final   Special Requests ABDOMEN  Final   Gram Stain   Final    NO WBC SEEN ABUNDANT GRAM POSITIVE COCCI FEW GRAM POSITIVE RODS FEW GRAM NEGATIVE RODS    Culture   Final    MODERATE STREPTOCOCCUS ANGINOSIS RARE ESCHERICHIA COLI HOLDING FOR POSSIBLE ANAEROBE Performed at [facility], [place] [address] [place], [place] [address]    Report Status PENDING  Incomplete   Organism ID, Bacteria STREPTOCOCCUS ANGINOSIS  Final   Organism ID, Bacteria ESCHERICHIA COLI  Final      Susceptibility   Escherichia coli - MIC*    AMPICILLIN >=32 RESISTANT Resistant      CEFAZOLIN 8 SENSITIVE Sensitive     CEFEPIME <=0.12 SENSITIVE Sensitive     CEFTAZIDIME <=1 SENSITIVE Sensitive     CEFTRIAXONE <=0.25 SENSITIVE Sensitive     CIPROFLOXACIN <=0.25 SENSITIVE Sensitive     GENTAMICIN <=1 SENSITIVE Sensitive     IMIPENEM <=0.25 SENSITIVE Sensitive     TRIMETH/SULFA <=20 SENSITIVE Sensitive     AMPICILLIN/SULBACTAM >=32 RESISTANT Resistant     PIP/TAZO 64 INTERMEDIATE Intermediate     * RARE ESCHERICHIA COLI   Streptococcus anginosis - MIC*    PENICILLIN 0.12 SENSITIVE Sensitive     CEFTRIAXONE 0.25 SENSITIVE Sensitive     ERYTHROMYCIN <=0.12 SENSITIVE Sensitive     LEVOFLOXACIN 0.5 SENSITIVE Sensitive     VANCOMYCIN 0.5 SENSITIVE Sensitive     * MODERATE STREPTOCOCCUS ANGINOSIS     Labs: BNP (last 3 results) No results for input(s): "BNP" in the last 8760 hours. Basic Metabolic Panel: Recent Labs  Lab 07/07/22 1140 07/08/22 0447 07/09/22 0659  NA 136 135 139  K 4.0 3.5 3.6  CL 100 101 102  CO2 '22 23 29  '$ GLUCOSE 124* 102* 111*  BUN '13 15 9  '$ CREATININE 0.94 1.01 0.89  CALCIUM 9.5 8.5* 9.1   Liver Function Tests: Recent Labs  Lab 07/07/22 1140  AST 40  ALT 57*  ALKPHOS 102  BILITOT 1.2  PROT 7.1  ALBUMIN 3.9   Recent Labs  Lab 07/07/22 1140  LIPASE 52*   No results for input(s): "AMMONIA" in the last 168 hours. CBC: Recent Labs  Lab 07/07/22 1140 07/08/22 0447 07/09/22 0659 07/11/22 0143  WBC 14.3* 9.2 7.3 7.1  HGB 13.9 12.1* 11.8* 12.5*  HCT 40.3 36.0* 36.1* 37.3*  MCV 85.0 87.6 87.6 85.9  PLT 291 226 251 309   Cardiac Enzymes: No results for input(s): "CKTOTAL", "CKMB", "CKMBINDEX", "TROPONINI" in the last 168 hours. BNP: Invalid input(s): "POCBNP" CBG: No results for input(s): "GLUCAP" in the last 168 hours. D-Dimer No results for input(s): "DDIMER" in the last 72 hours. Hgb A1c No results for input(s): "HGBA1C" in the last 72 hours. Lipid Profile No results for input(s): "CHOL", "HDL", "LDLCALC",  "TRIG", "CHOLHDL", "LDLDIRECT" in the last 72 hours. Thyroid function studies No results for input(s): "TSH", "T4TOTAL", "T3FREE", "THYROIDAB" in the last 72 hours.  Invalid input(s): "FREET3" Anemia work up No results for input(s): "VITAMINB12", "FOLATE", "  FERRITIN", "TIBC", "IRON", "RETICCTPCT" in the last 72 hours. Urinalysis    Component Value Date/Time   COLORURINE YELLOW 07/07/2022 1442   APPEARANCEUR CLEAR 07/07/2022 1442   LABSPEC 1.038 (H) 07/07/2022 1442   PHURINE 7.0 07/07/2022 1442   GLUCOSEU NEGATIVE 07/07/2022 1442   HGBUR NEGATIVE 07/07/2022 1442   BILIRUBINUR NEGATIVE 07/07/2022 1442   KETONESUR 20 (A) 07/07/2022 1442   PROTEINUR NEGATIVE 07/07/2022 1442   UROBILINOGEN 0.2 09/24/2013 0140   NITRITE NEGATIVE 07/07/2022 1442   LEUKOCYTESUR NEGATIVE 07/07/2022 1442   Sepsis Labs Recent Labs  Lab 07/07/22 1140 07/08/22 0447 07/09/22 0659 07/11/22 0143  WBC 14.3* 9.2 7.3 7.1   Microbiology Recent Results (from the past 240 hour(s))  SARS Coronavirus 2 by RT PCR (hospital order, performed in [place] hospital lab) *cepheid single result test* Anterior Nasal Swab     Status: None   Collection Time: 07/07/22 11:54 AM   Specimen: Anterior Nasal Swab  Result Value Ref Range Status   SARS Coronavirus 2 by RT PCR NEGATIVE NEGATIVE Final    Comment: (NOTE) SARS-CoV-2 target nucleic acids are NOT DETECTED.  The SARS-CoV-2 RNA is generally detectable in upper and lower respiratory specimens during the acute phase of infection. The lowest concentration of SARS-CoV-2 viral copies this assay can detect is 250 copies / mL. A negative result does not preclude SARS-CoV-2 infection and should not be used as the sole basis for treatment or other patient management decisions.  A negative result may occur with improper specimen collection / handling, submission of specimen other than nasopharyngeal swab, presence of viral mutation(s) within the areas targeted by this  assay, and inadequate number of viral copies (<250 copies / mL). A negative result must be combined with clinical observations, patient history, and epidemiological information.  Fact Sheet for Patients:   https://www.patel.info/  Fact Sheet for Healthcare Providers: https://hall.com/  This test is not yet approved or  cleared by the [place] FDA and has been authorized for detection and/or diagnosis of SARS-CoV-2 by FDA under an Emergency Use Authorization (EUA).  This EUA will remain in effect (meaning this test can be used) for the duration of the COVID-19 declaration under Section 564(b)(1) of the Act, 21 U.S.C. section 360bbb-3(b)(1), unless the authorization is terminated or revoked sooner.  Performed at [facility], [place] [address], [place], [place] [address]   Blood culture (routine x 2)     Status: None (Preliminary result)   Collection Time: 07/07/22  1:52 PM   Specimen: BLOOD  Result Value Ref Range Status   Specimen Description BLOOD RIGHT ANTECUBITAL  Final   Special Requests   Final    BOTTLES DRAWN AEROBIC AND ANAEROBIC Blood Culture adequate volume   Culture   Final    NO GROWTH 4 DAYS Performed at [facility], [place] [address] [place], [place] [address]    Report Status PENDING  Incomplete  Blood culture (routine x 2)     Status: None (Preliminary result)   Collection Time: 07/07/22  1:57 PM   Specimen: BLOOD  Result Value Ref Range Status   Specimen Description BLOOD BLOOD LEFT HAND  Final   Special Requests   Final    BOTTLES DRAWN AEROBIC AND ANAEROBIC Blood Culture results may not be optimal due to an inadequate volume of blood received in culture bottles   Culture   Final    NO GROWTH 4 DAYS Performed at [facility], [place],  [place] [address]    Report Status PENDING  Incomplete  Aerobic/Anaerobic Culture w Gram Stain (surgical/deep wound)     Status: None (Preliminary  result)   Collection Time: 07/08/22  1:19 PM   Specimen: Abscess  Result Value Ref Range Status   Specimen Description ABSCESS  Final   Special Requests ABDOMEN  Final   Gram Stain   Final    NO WBC SEEN ABUNDANT GRAM POSITIVE COCCI FEW GRAM POSITIVE RODS FEW GRAM NEGATIVE RODS    Culture   Final    MODERATE STREPTOCOCCUS ANGINOSIS RARE ESCHERICHIA COLI HOLDING FOR POSSIBLE ANAEROBE Performed at [facility], [place] [address], [place], [place] [address]    Report Status PENDING  Incomplete   Organism ID, Bacteria STREPTOCOCCUS ANGINOSIS  Final   Organism ID, Bacteria ESCHERICHIA COLI  Final      Susceptibility   Escherichia coli - MIC*    AMPICILLIN >=32 RESISTANT Resistant     CEFAZOLIN 8 SENSITIVE Sensitive     CEFEPIME <=0.12 SENSITIVE Sensitive     CEFTAZIDIME <=1 SENSITIVE Sensitive     CEFTRIAXONE <=0.25 SENSITIVE Sensitive     CIPROFLOXACIN <=0.25 SENSITIVE Sensitive     GENTAMICIN <=1 SENSITIVE Sensitive     IMIPENEM <=0.25 SENSITIVE Sensitive     TRIMETH/SULFA <=20 SENSITIVE Sensitive     AMPICILLIN/SULBACTAM >=32 RESISTANT Resistant     PIP/TAZO 64 INTERMEDIATE Intermediate     * RARE ESCHERICHIA COLI   Streptococcus anginosis - MIC*    PENICILLIN 0.12 SENSITIVE Sensitive     CEFTRIAXONE 0.25 SENSITIVE Sensitive     ERYTHROMYCIN <=0.12 SENSITIVE Sensitive     LEVOFLOXACIN 0.5 SENSITIVE Sensitive     VANCOMYCIN 0.5 SENSITIVE Sensitive     * MODERATE STREPTOCOCCUS ANGINOSIS     Time coordinating discharge: Over 30 minutes  SIGNED:   Phillips Climes, MD  [facility] 07/11/2022, 11:17 AM Pager   If 7PM-7AM, please contact night-coverage [email or web address] Password [email or web address]

## 2022-07-12 LAB — CULTURE, BLOOD (ROUTINE X 2)
Culture: NO GROWTH
Culture: NO GROWTH
Special Requests: ADEQUATE

## 2022-07-13 ENCOUNTER — Other Ambulatory Visit: Payer: Self-pay | Admitting: General Surgery

## 2022-07-13 DIAGNOSIS — K5720 Diverticulitis of large intestine with perforation and abscess without bleeding: Secondary | ICD-10-CM

## 2022-07-23 ENCOUNTER — Ambulatory Visit
Admission: RE | Admit: 2022-07-23 | Discharge: 2022-07-23 | Disposition: A | Discharge status: Home / Self Care | Payer: PRIVATE HEALTH INSURANCE | Source: Ambulatory Visit | Attending: General Surgery | Admitting: General Surgery

## 2022-07-23 ENCOUNTER — Ambulatory Visit
Admission: RE | Admit: 2022-07-23 | Discharge: 2022-07-23 | Disposition: A | Discharge status: Home / Self Care | Payer: PRIVATE HEALTH INSURANCE | Source: Ambulatory Visit | Attending: Student | Admitting: Student

## 2022-07-23 DIAGNOSIS — K5720 Diverticulitis of large intestine with perforation and abscess without bleeding: Secondary | ICD-10-CM

## 2022-07-23 HISTORY — PX: IR RADIOLOGIST EVAL & MGMT: IMG5224

## 2022-07-23 MED ORDER — IOPAMIDOL (ISOVUE-300) INJECTION 61%
100.0000 mL | Freq: Once | INTRAVENOUS | Status: AC | PRN
  Administered 2022-07-23: 100 mL via INTRAVENOUS

## 2022-07-23 NOTE — Progress Notes (Signed)
Chief Complaint: Patient was seen in consultation today for follow-up after percutaneous catheter drainage of sigmoid diverticular abscess.  Referring Physician(s): Ralene Ok  History of Present Illness: Carlos Stone is a 46 y.o. male status post percutaneous catheter drainage of sigmoid colonic diverticular abscess on 07/08/2022.  Since discharge from the hospital, clinical symptoms have improved dramatically and he has not had any fluid output from the drain for several days.  He still has 3 days of antibiotics left and denies any fever.  He has an appointment for colonoscopy in early November with Dr. Johney Maine.  Past Medical History:  Diagnosis Date   Anxiety disorder    Meningitis     Past Surgical History:  Procedure Laterality Date   APPENDECTOMY     KNEE ARTHROSCOPY      Allergies: Compazine [prochlorperazine edisylate], Amoxil [amoxicillin], Cipro [ciprofloxacin hcl], and Penicillins  Medications: Prior to Admission medications   Medication Sig Start Date End Date Taking? Authorizing Provider  acetaminophen (TYLENOL) 325 MG tablet Take 2 tablets (650 mg total) by mouth every 6 (six) hours as needed for mild pain (or Fever >/= 101). 07/11/22   Elgergawy, Silver Huguenin, MD  ANUCORT-HC 25 MG suppository Place 25 mg rectally 2 (two) times daily as needed for hemorrhoids. 02/16/22   [provider]  cefadroxil (DURICEF) 500 MG capsule Take 2 capsules (1,000 mg total) by mouth 2 (two) times daily for 14 days. 07/11/22 07/25/22  Elgergawy, Silver Huguenin, MD  clotrimazole-betamethasone (LOTRISONE) cream Apply 1 Application topically 2 (two) times daily. 05/24/22   [provider]  escitalopram (LEXAPRO) 20 MG tablet Take 1 tablet by mouth daily. 02/23/21   [provider]  metroNIDAZOLE (FLAGYL) 500 MG tablet Take 1 tablet (500 mg total) by mouth 3 (three) times daily for 14 days. 07/11/22 07/25/22  Elgergawy, Silver Huguenin, MD  oxyCODONE (OXY IR/ROXICODONE) 5 MG  immediate release tablet Take 1 tablet (5 mg total) by mouth every 6 (six) hours as needed for moderate pain. 07/11/22   Elgergawy, Silver Huguenin, MD  PROCTO-MED HC 2.5 % rectal cream Place 1 Application rectally 2 (two) times daily as needed for hemorrhoids. 02/16/22   [provider]  rosuvastatin (CRESTOR) 5 MG tablet Take 5 mg by mouth daily. Patient not taking: Reported on 07/07/2022 06/21/22   [provider]     No family history on file.  Social History   Socioeconomic History   Marital status: Married    Spouse name: Not on file   Number of children: Not on file   Years of education: Not on file   Highest education level: Not on file  Occupational History   Not on file  Tobacco Use   Smoking status: Never   Smokeless tobacco: Never  Substance and Sexual Activity   Alcohol use: Yes    Alcohol/week: 1.0 standard drink of alcohol    Types: 1 Glasses of wine per week    Comment: occasional   Drug use: No   Sexual activity: Yes  Other Topics Concern   Not on file  Social History Narrative   Not on file   Social Determinants of Health   Financial Resource Strain: Not on file  Food Insecurity: No Food Insecurity (07/07/2022)   Hunger Vital Sign    Worried About Running Out of Food in the Last Year: Never true    Ran Out of Food in the Last Year: Never true  Transportation Needs: No Transportation Needs (07/07/2022)  PRAPARE - Hydrologist (Medical): No    Lack of Transportation (Non-Medical): No  Physical Activity: Not on file  Stress: Not on file  Social Connections: Not on file    Review of Systems: A 12 point ROS discussed and pertinent positives are indicated in the HPI above.  All other systems are negative.  Review of Systems  Constitutional: Negative.   Respiratory: Negative.    Cardiovascular: Negative.   Gastrointestinal: Negative.   Genitourinary: Negative.   Musculoskeletal: Negative.   Neurological: Negative.      Vital Signs: There were no vitals taken for this visit.   Physical Exam Constitutional:      General: He is not in acute distress.    Appearance: Normal appearance. He is not ill-appearing, toxic-appearing or diaphoretic.  Abdominal:     General: There is no distension.     Palpations: Abdomen is soft.     Tenderness: There is no abdominal tenderness. There is no guarding.     Comments: Drain site: Clean, dry. No erythema or drainage.  Neurological:     Mental Status: He is alert.      Imaging: DG Sinus/Fist Tube Chk-Non GI  Result Date: 07/23/2022 INDICATION: Sigmoid colonic diverticular abscess and status post percutaneous catheter drainage on 07/08/2022. CT earlier today demonstrates decompression of the abscess cavity. There is no significant further fluid drainage from the catheter. EXAM: INJECTION OF PERCUTANEOUS ABSCESS DRAINAGE CATHETER UNDER FLUOROSCOPY MEDICATIONS: None ANESTHESIA/SEDATION: None CONTRAST:  20 mL Omnipaque 300 FLUOROSCOPY TIME:  1 minute and 12 seconds.  51.0 mGy COMPLICATIONS: None immediate. PROCEDURE: The percutaneous abscess drainage catheter was injected with contrast under fluoroscopy and multiple images saved in different projections. The drainage catheter was then cut and removed. A dressing was applied over the catheter exit site. FINDINGS: Initial catheter injection demonstrates decompressed abscess cavity with no further cavity remaining. There is immediate reflux of contrast from the skin exit site of the drain and over the patient's body. With further injection of the drain in oblique positions, there is no opacification of a fistula to the sigmoid colon or other adjacent structures. IMPRESSION: Resolved sigmoid colonic diverticular abscess with no evidence of fistula. Due to injection findings as well as lack of output from the drainage catheter, the percutaneous drainage catheter was removed today. Electronically Signed   By: Aletta Edouard M.D.    On: 07/23/2022 13:58   CT ABDOMEN PELVIS W CONTRAST  Result Date: 07/23/2022 CLINICAL DATA:  Status post percutaneous catheter drainage of sigmoid colonic diverticular abscess on 07/08/2022. EXAM: CT ABDOMEN AND PELVIS WITH CONTRAST TECHNIQUE: Multidetector CT imaging of the abdomen and pelvis was performed using the standard protocol following bolus administration of intravenous contrast. RADIATION DOSE REDUCTION: This exam was performed according to the departmental dose-optimization program which includes automated exposure control, adjustment of the mA and/or kV according to patient size and/or use of iterative reconstruction technique. CONTRAST:  135m ISOVUE-300 IOPAMIDOL (ISOVUE-300) INJECTION 61% COMPARISON:  07/07/2022 FINDINGS: Lower chest: No acute abnormality. Hepatobiliary: No focal liver abnormality is seen. No gallstones, gallbladder wall thickening, or biliary dilatation. Pancreas: Unremarkable. No pancreatic ductal dilatation or surrounding inflammatory changes. Spleen: Normal in size without focal abnormality. Adrenals/Urinary Tract: Adrenal glands are unremarkable. Kidneys are normal, without renal calculi, focal lesion, or hydronephrosis. Bladder is unremarkable. Stomach/Bowel: Inflammation related to diverticulitis at the level of the mid sigmoid colon is markedly improved. Percutaneous drainage catheter remains in stable position inferior to the sigmoid colon with  complete decompression diverticular abscess and no residual fluid remaining. There is some soft tissue likely connecting the abscess cavity to the lumen of the colon and suspicious for fistula. No new fluid collections are identified. Stable diverticulosis. Bowel shows no evidence of obstruction, ileus, inflammation or lesion. The appendix has been removed. No free intraperitoneal air. Vascular/Lymphatic: No significant vascular findings are present. No enlarged abdominal or pelvic lymph nodes. Reproductive: Moderate prostate  enlargement. Other: No abdominal wall hernia or abnormality. No abdominopelvic ascites. Musculoskeletal: No acute or significant osseous findings. IMPRESSION: Complete decompression of sigmoid colonic diverticular abscess after catheter drainage. Probable fistula visualized to the adjacent lumen of the sigmoid colon. The drainage catheter will be injected under fluoroscopy and a separate catheter injection study will be reported. Inflammation related to diverticulitis of the mid sigmoid colon is markedly improved. No new fluid collections. Electronically Signed   By: Aletta Edouard M.D.   On: 07/23/2022 13:16   CT GUIDED VISCERAL FLUID DRAIN BY PERC CATH  Result Date: 07/08/2022 INDICATION: 46 year old male with diverticular abscess referred for drainage EXAM: CT GUIDED DRAINAGE OF  ABSCESS MEDICATIONS: The patient is currently admitted to the hospital and receiving intravenous antibiotics. The antibiotics were administered within an appropriate time frame prior to the initiation of the procedure. ANESTHESIA/SEDATION: 2.0 mg IV Versed 100 mcg IV Fentanyl Moderate Sedation Time:  12 minutes The patient was continuously monitored during the procedure by the interventional radiology nurse under my direct supervision. COMPLICATIONS: None TECHNIQUE: Informed written consent was obtained from the patient after a thorough discussion of the procedural risks, benefits and alternatives. All questions were addressed. Maximal Sterile Barrier Technique was utilized including caps, mask, sterile gowns, sterile gloves, sterile drape, hand hygiene and skin antiseptic. A timeout was performed prior to the initiation of the procedure. PROCEDURE: The operative field was prepped with Chlorhexidine in a sterile fashion, and a sterile drape was applied covering the operative field. A sterile gown and sterile gloves were used for the procedure. Local anesthesia was provided with 1% Lidocaine. Once the patient is prepped and draped in  the usual sterile fashion and 1% lidocaine was used for local anesthesia, CT guidance was used to place a trocar needle into the lower abdomen/pelvic abscess. Once we confirmed needle tip position, modified Seldinger technique was used to place a 10 [place] pigtail drain. Aspiration of proximally 20-25 cc of frankly purulent material was sent for culture. Final image was stored. Drain was sutured in position and attached to bulb suction. Patient tolerated the procedure well and remained hemodynamically stable throughout. No complications were encountered and no significant blood loss. FINDINGS: Abscess within the low abdomen/pelvis, drained via 10 French drain catheter. There may be accumulate K shin with the smaller abscess more cranially and thus 1 drain was placed. IMPRESSION: Status post CT-guided drainage pelvic abscess. Signed, Dulcy Fanny. Nadene Rubins, RPVI [facility] Electronically Signed   By: Corrie Mckusick D.O.   On: 07/08/2022 14:38   CT ABDOMEN PELVIS W CONTRAST  Result Date: 07/07/2022 CLINICAL DATA:  History of sigmoid diverticulitis with recurrent left lower quadrant abdominal pain and fever. EXAM: CT ABDOMEN AND PELVIS WITH CONTRAST TECHNIQUE: Multidetector CT imaging of the abdomen and pelvis was performed using the standard protocol following bolus administration of intravenous contrast. RADIATION DOSE REDUCTION: This exam was performed according to the departmental dose-optimization program which includes automated exposure control, adjustment of the mA and/or kV according to patient size and/or use of iterative  reconstruction technique. CONTRAST:  128m OMNIPAQUE IOHEXOL 300 MG/ML  SOLN COMPARISON:  None Available. FINDINGS: Lower chest: Limited visualization of the lower thorax demonstrates minimal bibasilar dependent subpleural ground-glass atelectasis, right greater than left. No discrete focal airspace opacities. No pleural  effusion. Normal heart size.  No pericardial effusion. Hepatobiliary: Normal hepatic contour. There is mild diffuse decreased attenuation hepatic parenchyma on this postcontrast examination suggestive of hepatic steatosis. No discrete hepatic lesions. No radiopaque gallstones. No intra or extrahepatic biliary duct dilatation. No ascites. Pancreas: Normal appearance of the pancreas. Spleen: The spleen is incidentally noted to be scratched at the splenic hilum is incidentally noted to be angled cranially. The spleen is otherwise normal in appearance. There is a punctate splenule about the anterior aspect of the hilum. Adrenals/Urinary Tract: There is symmetric enhancement of the bilateral kidneys. No evidence of nephrolithiasis on this postcontrast examination. No discrete renal lesions. No urinary obstruction or perinephric stranding. There is minimal secondary wall thickening involving the dome of the urinary bladder, adjacent to the sigmoid colonic diverticular abscess. No air is seen within the urinary bladder to suggest the presence of a colovesicular fistula. Stomach/Bowel: There is extensive inflammatory change involving the sigmoid colon within the left lower abdomen/pelvis with associated adjacent mesenteric stranding and an approximately 4.6 x 2.5 x 1.7 cm pericolonic diverticular abscess positioned between the inferior aspect of the sigmoid colon and dome of the urinary bladder (axial image 78, series 3; coronal image 41, series 6). An additional prominent diverticuli is noted adjacent to the superior aspect of this diseased segment of the sigmoid colon (axial image 71, series 3; coronal image 37, series 6) without definitive evidence of perforation at this location. There is extensive wall thickening involving the sigmoid colon at this location with moderate upstream stool burden but without definitive evidence of enteric obstruction. No additional areas of bowel wall thickening are identified. Normal  appearance of the terminal ileum. The appendix is not visualized compatible with provided history of previous appendectomy. No significant hiatal hernia. No pneumatosis or portal venous gas. Vascular/Lymphatic: Minimal amount of atherosclerotic plaque involving the caudal aspect of the abdominal aorta and left common iliac artery, not resulting in a hemodynamically significant stenosis. The major branch vessels of the abdominal aorta appear widely patent on this non CTA examination. No bulky retroperitoneal, mesenteric, pelvic or inguinal lymph adenopathy. Reproductive: Dystrophic calcifications within normal sized prostate gland. No free fluid the pelvic cul-de-sac. Other: Regional soft tissues appear normal. Musculoskeletal: No acute or aggressive osseous abnormalities. Incidental note is made of a left-sided L5-S1 assimilation joint. IMPRESSION: 1. The examination positive for acute diverticulitis involving the sigmoid colon within the left lower abdomen/pelvis with associated approximately 4.6 cm diverticular abscess positioned between the inferior aspect of the disease segment of the sigmoid colon and the urinary bladder. The diseased segment of the colon demonstrates extensive wall thickening however presently there is no evidence of enteric obstruction. 2. Suspected hepatic steatosis. 3.  Aortic Atherosclerosis (ICD10-I70.0). 4. Post appendectomy. Electronically Signed   By: JSandi MariscalM.D.   On: 07/07/2022 13:40    Labs:  CBC: Recent Labs    07/07/22 1140 07/08/22 0447 07/09/22 0659 07/11/22 0143  WBC 14.3* 9.2 7.3 7.1  HGB 13.9 12.1* 11.8* 12.5*  HCT 40.3 36.0* 36.1* 37.3*  PLT 291 226 251 309    COAGS: Recent Labs    07/08/22 0447  INR 1.2    BMP: Recent Labs    07/07/22 1140 07/08/22 0447 07/09/22 0659  NA  136 135 139  K 4.0 3.5 3.6  CL 100 101 102  CO2 '22 23 29  '$ GLUCOSE 124* 102* 111*  BUN '13 15 9  '$ CALCIUM 9.5 8.5* 9.1  CREATININE 0.94 1.01 0.89  GFRNONAA >60 >60  >60    LIVER FUNCTION TESTS: Recent Labs    07/07/22 1140  BILITOT 1.2  AST 40  ALT 57*  ALKPHOS 102  PROT 7.1  ALBUMIN 3.9     Assessment and Plan:  Resolution of sigmoid colonic diverticular abscess by CT with markedly reduced inflammation.  Injection of the drainage catheter under fluoroscopy today demonstrates no evidence of fistula to the colon.  Given lack of output from the drain for the last several days, the percutaneous drain was removed today without difficulty.  The patient will follow-up with general surgery and is scheduled for colonoscopy with Dr. Johney Maine in early November.  Electronically Signed: Azzie Roup 07/23/2022, 2:27 PM    I spent a total of 10 Minutes in face to face in clinical consultation, greater than 50% of which was counseling/coordinating care

## 2022-07-27 ENCOUNTER — Other Ambulatory Visit: Payer: PRIVATE HEALTH INSURANCE

## 2022-09-06 ENCOUNTER — Ambulatory Visit: Payer: Self-pay | Admitting: Surgery

## 2022-09-06 DIAGNOSIS — R739 Hyperglycemia, unspecified: Secondary | ICD-10-CM

## 2022-09-09 ENCOUNTER — Other Ambulatory Visit: Payer: Self-pay | Admitting: Urology

## 2022-10-28 ENCOUNTER — Encounter (HOSPITAL_COMMUNITY): Payer: Self-pay

## 2022-10-28 NOTE — Patient Instructions (Addendum)
DUE TO COVID-19 ONLY TWO VISITORS  (aged 48 and older)  ARE ALLOWED TO COME WITH YOU AND STAY IN THE WAITING ROOM ONLY DURING PRE OP AND PROCEDURE.   **NO VISITORS ARE ALLOWED IN THE SHORT STAY AREA OR RECOVERY ROOM!!**  IF YOU WILL BE ADMITTED INTO THE HOSPITAL YOU ARE ALLOWED ONLY FOUR SUPPORT PEOPLE DURING VISITATION HOURS ONLY (7 AM -8PM)   The support person(s) must pass our screening, gel in and out, and wear a mask at all times, including in the patient's room. Patients must also wear a mask when staff or their support person are in the room. Visitors GUEST BADGE MUST BE WORN VISIBLY  One adult visitor may remain with you overnight and MUST be in the room by 8 P.M.     Your procedure is scheduled on: 11/10/22   Report to [facility] Main Entrance    Report to admitting at  6:15 AM   Call this number if you have problems the morning of surgery [phone]   Do not eat food :After Midnight.   After Midnight you may have the following liquids until _5:30_____ AM/ PM DAY OF SURGERY  Water Black Coffee (sugar ok, NO MILK/CREAM OR CREAMERS)  Tea (sugar ok, NO MILK/CREAM OR CREAMERS) regular and decaf                             Plain Jell-O (NO RED)                                           Fruit ices (not with fruit pulp, NO RED)                                     Popsicles (NO RED)                                                                  Juice: apple, WHITE grape, WHITE cranberry Sports drinks like Gatorade (NO RED)               Drink 2 Ensure/G2 drinks AT 10:00 PM the night before surgery.       The day of surgery:  Drink ONE (1) Pre-Surgery Clear Ensure at 5:15  at AM the morning of surgery. Drink in one sitting. Do not sip.  This drink was given to you during your hospital  pre-op appointment visit. Nothing else to drink after completing the  Pre-Surgery Clear Ensure at 5:30 AM.          If you have questions, please contact your surgeon's  office.   FOLLOW BOWEL PREP AND ANY ADDITIONAL PRE OP INSTRUCTIONS YOU RECEIVED FROM YOUR SURGEON'S OFFICE!!!     Oral Hygiene is also important to reduce your risk of infection.                                    Remember - BRUSH YOUR TEETH THE MORNING OF  SURGERY WITH YOUR REGULAR TOOTHPASTE  DENTURES WILL BE REMOVED PRIOR TO SURGERY PLEASE DO NOT APPLY "Poly grip" OR ADHESIVES!!!   Do NOT smoke after Midnight   Take these medicines the morning of surgery with A SIP OF WATER: Escitalopram-Lexapro   Bring CPAP mask and tubing day of surgery.                              You may not have any metal on your body including  jewelry, and body piercing             Do not wear lotions, powders, cologne, or deodorant                Men may shave face and neck.   Do not bring valuables to the hospital. [place].   Contacts, glasses, or bridgework may not be worn into surgery.   Bring small overnight bag day of surgery.   DO NOT [place]. PHARMACY WILL DISPENSE MEDICATIONS LISTED ON YOUR MEDICATION LIST TO YOU DURING YOUR ADMISSION [place]!       Special Instructions: Bring a copy of your healthcare power of attorney and living will documents  the day of surgery if you haven't scanned them before.              Please read over the following fact sheets you were given: IF YOU HAVE QUESTIONS ABOUT YOUR PRE-OP INSTRUCTIONS PLEASE CALL [phone]    [facility] - Preparing for Surgery Before surgery, you can play an important role.  Because skin is not sterile, your skin needs to be as free of germs as possible.  You can reduce the number of germs on your skin by washing with CHG (chlorahexidine gluconate) soap before surgery.  CHG is an antiseptic cleaner which kills germs and bonds with the skin to continue killing germs even after washing. Please DO NOT use if you have an allergy to CHG or  antibacterial soaps.  If your skin becomes reddened/irritated stop using the CHG and inform your nurse when you arrive at Short Stay.  You may shave your face/neck. Please follow these instructions carefully:  1.  Shower with CHG Soap the night before surgery and the  morning of Surgery.  2.  If you choose to wash your hair, wash your hair first as usual with your  normal  shampoo.  3.  After you shampoo, rinse your hair and body thoroughly to remove the  shampoo.                            4.  Use CHG as you would any other liquid soap.  You can apply chg directly  to the skin and wash                       Gently with a scrungie or clean washcloth.  5.  Apply the CHG Soap to your body ONLY FROM THE NECK DOWN.   Do not use on face/ open                           Wound or open sores. Avoid contact with eyes, ears mouth and genitals (  private parts).                       Wash face,  Genitals (private parts) with your normal soap.             6.  Wash thoroughly, paying special attention to the area where your surgery  will be performed.  7.  Thoroughly rinse your body with warm water from the neck down.  8.  DO NOT shower/wash with your normal soap after using and rinsing off  the CHG Soap.             9.  Pat yourself dry with a clean towel.            10.  Wear clean pajamas.            11.  Place clean sheets on your bed the night of your first shower and do not  sleep with pets. Day of Surgery : Do not apply any lotions/deodorants the morning of surgery.  Please wear clean clothes to the hospital/surgery center.  FAILURE TO FOLLOW THESE INSTRUCTIONS MAY RESULT IN THE CANCELLATION OF YOUR SURGERY    ________________________________________________________________________  Incentive Spirometer  An incentive spirometer is a tool that can help keep your lungs clear and active. This tool measures how well you are filling your lungs with each breath. Taking long deep breaths may help reverse  or decrease the chance of developing breathing (pulmonary) problems (especially infection) following: A long period of time when you are unable to move or be active. BEFORE THE PROCEDURE  If the spirometer includes an indicator to show your best effort, your nurse or respiratory therapist will set it to a desired goal. If possible, sit up straight or lean slightly forward. Try not to slouch. Hold the incentive spirometer in an upright position. INSTRUCTIONS FOR USE  Sit on the edge of your bed if possible, or sit up as far as you can in bed or on a chair. Hold the incentive spirometer in an upright position. Breathe out normally. Place the mouthpiece in your mouth and seal your lips tightly around it. Breathe in slowly and as deeply as possible, raising the piston or the ball toward the top of the column. Hold your breath for 3-5 seconds or for as long as possible. Allow the piston or ball to fall to the bottom of the column. Remove the mouthpiece from your mouth and breathe out normally. Rest for a few seconds and repeat Steps 1 through 7 at least 10 times every 1-2 hours when you are awake. Take your time and take a few normal breaths between deep breaths. The spirometer may include an indicator to show your best effort. Use the indicator as a goal to work toward during each repetition. After each set of 10 deep breaths, practice coughing to be sure your lungs are clear. If you have an incision (the cut made at the time of surgery), support your incision when coughing by placing a pillow or rolled up towels firmly against it. Once you are able to get out of bed, walk around indoors and cough well. You may stop using the incentive spirometer when instructed by your caregiver.  RISKS AND COMPLICATIONS Take your time so you do not get dizzy or light-headed. If you are in pain, you may need to take or ask for pain medication before doing incentive spirometry. It is harder to take a deep breath if  you are  having pain. AFTER USE Rest and breathe slowly and easily. It can be helpful to keep track of a log of your progress. Your caregiver can provide you with a simple table to help with this. If you are using the spirometer at home, follow these instructions: [place] IF:  You are having difficultly using the spirometer. You have trouble using the spirometer as often as instructed. Your pain medication is not giving enough relief while using the spirometer. You develop fever of 100.5 F (38.1 C) or higher. SEEK IMMEDIATE MEDICAL CARE IF:  You cough up bloody sputum that had not been present before. You develop fever of 102 F (38.9 C) or greater. You develop worsening pain at or near the incision site. MAKE SURE YOU:  Understand these instructions. Will watch your condition. Will get help right away if you are not doing well or get worse. Document Released: 02/28/2007 Document Revised: 01/10/2012 Document Reviewed: 05/01/2007 [facility] Patient Information 2014 [place], [place].   ________________________________________________________________________

## 2022-11-05 ENCOUNTER — Encounter (HOSPITAL_COMMUNITY): Payer: Self-pay

## 2022-11-05 ENCOUNTER — Encounter (HOSPITAL_COMMUNITY)
Admission: RE | Admit: 2022-11-05 | Discharge: 2022-11-05 | Disposition: A | Discharge status: Home / Self Care | Payer: No Typology Code available for payment source | Source: Ambulatory Visit | Attending: Surgery | Admitting: Surgery

## 2022-11-05 ENCOUNTER — Other Ambulatory Visit: Payer: Self-pay

## 2022-11-05 DIAGNOSIS — R739 Hyperglycemia, unspecified: Secondary | ICD-10-CM | POA: Insufficient documentation

## 2022-11-05 DIAGNOSIS — Z01818 Encounter for other preprocedural examination: Secondary | ICD-10-CM | POA: Insufficient documentation

## 2022-11-05 HISTORY — DX: Diverticulosis of intestine, part unspecified, without perforation or abscess without bleeding: K57.90

## 2022-11-05 LAB — CBC
HCT: 46.1 % (ref 39.0–52.0)
Hemoglobin: 15.2 g/dL (ref 13.0–17.0)
MCH: 28.8 pg (ref 26.0–34.0)
MCHC: 33 g/dL (ref 30.0–36.0)
MCV: 87.3 fL (ref 80.0–100.0)
Platelets: 255 10*3/uL (ref 150–400)
RBC: 5.28 MIL/uL (ref 4.22–5.81)
RDW: 12.6 % (ref 11.5–15.5)
WBC: 6.5 10*3/uL (ref 4.0–10.5)
nRBC: 0 % (ref 0.0–0.2)

## 2022-11-05 LAB — BASIC METABOLIC PANEL
Anion gap: 6 (ref 5–15)
BUN: 15 mg/dL (ref 6–20)
CO2: 27 mmol/L (ref 22–32)
Calcium: 9.7 mg/dL (ref 8.9–10.3)
Chloride: 105 mmol/L (ref 98–111)
Creatinine, Ser: 0.89 mg/dL (ref 0.61–1.24)
GFR, Estimated: >60 mL/min (ref >60)
Glucose, Bld: 109 mg/dL — ABNORMAL HIGH (ref 70–99)
Potassium: 4.6 mmol/L (ref 3.5–5.1)
Sodium: 138 mmol/L (ref 135–145)

## 2022-11-05 NOTE — Progress Notes (Addendum)
Anesthesia note:  Bowel prep reminder:  yes. Pt will review it and follow it. He has a colonoscopy 2 days before surgery.  PCP -Dr. Eddie Candle  Cardiologist -none Other-   Chest x-ray - no EKG - 11/05/22-chart Stress Test - no ECHO - 2014 Cardiac Cath - no CABG-no Pacemaker/ICD device last checked:NA  Sleep Study - no CPAP -   Pt is pre diabetic-no CBG at PAT visit- Fasting Blood Sugar at home- Checks Blood Sugar _____  Blood Thinner:no Blood Thinner Instructions: Aspirin Instructions: Last Dose:  Anesthesia review: No    Patient denies shortness of breath, fever, cough and chest pain at PAT appointment. Pt has no SOB with activities   Patient verbalized understanding of instructions that were given to them at the PAT appointment. Patient was also instructed that they will need to review over the PAT instructions again at home before surgery.yes

## 2022-11-06 LAB — HEMOGLOBIN A1C
Hgb A1c MFr Bld: 5.5 % (ref 4.8–5.6)
Mean Plasma Glucose: 111 mg/dL

## 2022-11-09 NOTE — Anesthesia Preprocedure Evaluation (Signed)
Anesthesia Evaluation  Patient identified by MRN, date of birth, ID band Patient awake    Reviewed: Allergy & Precautions, NPO status , Patient's Chart, lab work & pertinent test results  Airway Mallampati: II  TM Distance: >3 FB Neck ROM: Full    Dental no notable dental hx.    Pulmonary neg pulmonary ROS   Pulmonary exam normal        Cardiovascular negative cardio ROS Normal cardiovascular exam     Neuro/Psych  PSYCHIATRIC DISORDERS Anxiety     negative neurological ROS     GI/Hepatic Neg liver ROS,,,  Endo/Other  negative endocrine ROS    Renal/GU negative Renal ROS     Musculoskeletal negative musculoskeletal ROS (+)    Abdominal   Peds  Hematology negative hematology ROS (+)   Anesthesia Other Findings DIVERTICULITIS  Reproductive/Obstetrics                             Anesthesia Physical Anesthesia Plan  ASA: 2  Anesthesia Plan: General   Post-op Pain Management:    Induction: Intravenous  PONV Risk Score and Plan: 3 and Ondansetron, Dexamethasone, Midazolam and Treatment may vary due to age or medical condition  Airway Management Planned: Oral ETT  Additional Equipment:   Intra-op Plan:   Post-operative Plan: Extubation in OR  Informed Consent: I have reviewed the patients History and Physical, chart, labs and discussed the procedure including the risks, benefits and alternatives for the proposed anesthesia with the patient or authorized representative who has indicated his/her understanding and acceptance.     Dental advisory given  Plan Discussed with: CRNA  Anesthesia Plan Comments:        Anesthesia Quick Evaluation

## 2022-11-10 ENCOUNTER — Inpatient Hospital Stay (HOSPITAL_COMMUNITY): Payer: No Typology Code available for payment source | Admitting: Certified Registered"

## 2022-11-10 ENCOUNTER — Encounter (HOSPITAL_COMMUNITY)
Admission: RE | Disposition: A | Discharge status: Home Health | Payer: Self-pay | Source: Home / Self Care | Attending: Surgery

## 2022-11-10 ENCOUNTER — Other Ambulatory Visit: Payer: Self-pay

## 2022-11-10 ENCOUNTER — Inpatient Hospital Stay (HOSPITAL_COMMUNITY)
Admission: RE | Admit: 2022-11-10 | Discharge: 2022-11-22 | DRG: 329 | Disposition: A | Discharge status: Home Health | Payer: No Typology Code available for payment source | Attending: Surgery | Admitting: Surgery

## 2022-11-10 ENCOUNTER — Encounter (HOSPITAL_COMMUNITY): Payer: Self-pay | Admitting: Surgery

## 2022-11-10 DIAGNOSIS — G4700 Insomnia, unspecified: Secondary | ICD-10-CM | POA: Diagnosis not present

## 2022-11-10 DIAGNOSIS — Z6831 Body mass index (BMI) 31.0-31.9, adult: Secondary | ICD-10-CM

## 2022-11-10 DIAGNOSIS — K5720 Diverticulitis of large intestine with perforation and abscess without bleeding: Principal | ICD-10-CM | POA: Diagnosis present

## 2022-11-10 DIAGNOSIS — Z79899 Other long term (current) drug therapy: Secondary | ICD-10-CM

## 2022-11-10 DIAGNOSIS — K567 Ileus, unspecified: Secondary | ICD-10-CM | POA: Diagnosis not present

## 2022-11-10 DIAGNOSIS — E669 Obesity, unspecified: Secondary | ICD-10-CM | POA: Diagnosis present

## 2022-11-10 DIAGNOSIS — Y832 Surgical operation with anastomosis, bypass or graft as the cause of abnormal reaction of the patient, or of later complication, without mention of misadventure at the time of the procedure: Secondary | ICD-10-CM | POA: Diagnosis not present

## 2022-11-10 DIAGNOSIS — Z880 Allergy status to penicillin: Secondary | ICD-10-CM | POA: Diagnosis not present

## 2022-11-10 DIAGNOSIS — K5651 Intestinal adhesions [bands], with partial obstruction: Secondary | ICD-10-CM | POA: Diagnosis not present

## 2022-11-10 DIAGNOSIS — T8132XA Disruption of internal operation (surgical) wound, not elsewhere classified, initial encounter: Secondary | ICD-10-CM | POA: Diagnosis not present

## 2022-11-10 DIAGNOSIS — Z8249 Family history of ischemic heart disease and other diseases of the circulatory system: Secondary | ICD-10-CM | POA: Diagnosis not present

## 2022-11-10 DIAGNOSIS — F32A Depression, unspecified: Secondary | ICD-10-CM | POA: Insufficient documentation

## 2022-11-10 DIAGNOSIS — Z881 Allergy status to other antibiotic agents status: Secondary | ICD-10-CM | POA: Diagnosis not present

## 2022-11-10 DIAGNOSIS — K5732 Diverticulitis of large intestine without perforation or abscess without bleeding: Secondary | ICD-10-CM | POA: Diagnosis present

## 2022-11-10 DIAGNOSIS — N321 Vesicointestinal fistula: Secondary | ICD-10-CM | POA: Diagnosis not present

## 2022-11-10 DIAGNOSIS — Y733 Surgical instruments, materials and gastroenterology and urology devices (including sutures) associated with adverse incidents: Secondary | ICD-10-CM | POA: Diagnosis not present

## 2022-11-10 DIAGNOSIS — K9189 Other postprocedural complications and disorders of digestive system: Secondary | ICD-10-CM | POA: Diagnosis not present

## 2022-11-10 DIAGNOSIS — Z01818 Encounter for other preprocedural examination: Principal | ICD-10-CM

## 2022-11-10 DIAGNOSIS — R1032 Left lower quadrant pain: Secondary | ICD-10-CM | POA: Diagnosis present

## 2022-11-10 DIAGNOSIS — F419 Anxiety disorder, unspecified: Secondary | ICD-10-CM

## 2022-11-10 DIAGNOSIS — K5792 Diverticulitis of intestine, part unspecified, without perforation or abscess without bleeding: Secondary | ICD-10-CM | POA: Diagnosis present

## 2022-11-10 DIAGNOSIS — K651 Peritoneal abscess: Secondary | ICD-10-CM | POA: Diagnosis not present

## 2022-11-10 HISTORY — PX: PROCTOSCOPY: SHX2266

## 2022-11-10 LAB — ABO/RH: ABO/RH(D): O POS

## 2022-11-10 LAB — TYPE AND SCREEN
ABO/RH(D): O POS
Antibody Screen: NEGATIVE

## 2022-11-10 SURGERY — COLECTOMY, SIGMOID, ROBOT-ASSISTED
Anesthesia: General

## 2022-11-10 MED ORDER — ENSURE PRE-SURGERY PO LIQD: 296.0000 mL | Freq: Once | ORAL | Status: DC

## 2022-11-10 MED ORDER — MIDAZOLAM HCL 2 MG/2ML IJ SOLN
INTRAMUSCULAR | Status: DC | PRN
  Administered 2022-11-10: 2 mg via INTRAVENOUS

## 2022-11-10 MED ORDER — SODIUM CHLORIDE 0.9 % IV SOLN
1.0000 g | INTRAVENOUS | Status: DC
  Filled 2022-11-10: qty 1

## 2022-11-10 MED ORDER — SIMETHICONE 80 MG PO CHEW
40.0000 mg | CHEWABLE_TABLET | Freq: Four times a day (QID) | ORAL | Status: DC | PRN
  Administered 2022-11-12 – 2022-11-13 (×2): 40 mg via ORAL
  Filled 2022-11-10 (×2): qty 1

## 2022-11-10 MED ORDER — SODIUM CHLORIDE 0.9% FLUSH
3.0000 mL | Freq: Two times a day (BID) | INTRAVENOUS | Status: DC
  Administered 2022-11-11 – 2022-11-22 (×16): 3 mL via INTRAVENOUS

## 2022-11-10 MED ORDER — KETOROLAC TROMETHAMINE 30 MG/ML IJ SOLN
30.0000 mg | Freq: Once | INTRAMUSCULAR | Status: AC | PRN
  Administered 2022-11-10: 30 mg via INTRAVENOUS

## 2022-11-10 MED ORDER — LIDOCAINE 2% (20 MG/ML) 5 ML SYRINGE
INTRAMUSCULAR | Status: DC | PRN
  Administered 2022-11-10: 60 mg via INTRAVENOUS

## 2022-11-10 MED ORDER — LACTATED RINGERS IV BOLUS: 1000.0000 mL | Freq: Three times a day (TID) | INTRAVENOUS | Status: AC | PRN

## 2022-11-10 MED ORDER — SUGAMMADEX SODIUM 200 MG/2ML IV SOLN
INTRAVENOUS | Status: DC | PRN
  Administered 2022-11-10: 200 mg via INTRAVENOUS

## 2022-11-10 MED ORDER — CLINDAMYCIN PHOSPHATE 900 MG/50ML IV SOLN
900.0000 mg | Freq: Three times a day (TID) | INTRAVENOUS | Status: AC
  Administered 2022-11-10: 900 mg via INTRAVENOUS
  Filled 2022-11-10: qty 50

## 2022-11-10 MED ORDER — POLYETHYLENE GLYCOL 3350 17 GM/SCOOP PO POWD: 1.0000 | Freq: Once | ORAL | Status: DC

## 2022-11-10 MED ORDER — ROCURONIUM BROMIDE 10 MG/ML (PF) SYRINGE
PREFILLED_SYRINGE | INTRAVENOUS | Status: DC | PRN
  Administered 2022-11-10: 60 mg via INTRAVENOUS
  Administered 2022-11-10 (×2): 20 mg via INTRAVENOUS

## 2022-11-10 MED ORDER — EPHEDRINE 5 MG/ML INJ
INTRAVENOUS | Status: AC
  Filled 2022-11-10: qty 10

## 2022-11-10 MED ORDER — SODIUM CHLORIDE 0.9 % IV SOLN: 250.0000 mL | INTRAVENOUS | Status: DC | PRN

## 2022-11-10 MED ORDER — GABAPENTIN 300 MG PO CAPS
300.0000 mg | ORAL_CAPSULE | ORAL | Status: AC
  Administered 2022-11-10: 300 mg via ORAL
  Filled 2022-11-10: qty 1

## 2022-11-10 MED ORDER — BISACODYL 5 MG PO TBEC: 20.0000 mg | DELAYED_RELEASE_TABLET | Freq: Once | ORAL | Status: DC

## 2022-11-10 MED ORDER — PROPOFOL 10 MG/ML IV BOLUS
INTRAVENOUS | Status: DC | PRN
  Administered 2022-11-10: 150 mg via INTRAVENOUS

## 2022-11-10 MED ORDER — SIMETHICONE 80 MG PO CHEW
80.0000 mg | CHEWABLE_TABLET | Freq: Four times a day (QID) | ORAL | Status: DC
  Administered 2022-11-10 – 2022-11-11 (×6): 80 mg via ORAL
  Filled 2022-11-10 (×7): qty 1

## 2022-11-10 MED ORDER — ROCURONIUM BROMIDE 10 MG/ML (PF) SYRINGE
PREFILLED_SYRINGE | INTRAVENOUS | Status: AC
  Filled 2022-11-10: qty 10

## 2022-11-10 MED ORDER — DEXAMETHASONE SODIUM PHOSPHATE 10 MG/ML IJ SOLN
INTRAMUSCULAR | Status: DC | PRN
  Administered 2022-11-10: 8 mg via INTRAVENOUS

## 2022-11-10 MED ORDER — METHOCARBAMOL 500 MG PO TABS
1000.0000 mg | ORAL_TABLET | Freq: Four times a day (QID) | ORAL | Status: DC | PRN
  Administered 2022-11-11 – 2022-11-13 (×4): 1000 mg via ORAL
  Filled 2022-11-10 (×5): qty 2

## 2022-11-10 MED ORDER — BUPIVACAINE-EPINEPHRINE 0.5% -1:200000 IJ SOLN
INTRAMUSCULAR | Status: DC | PRN
  Administered 2022-11-10: 30 mL

## 2022-11-10 MED ORDER — PROPOFOL 10 MG/ML IV BOLUS
INTRAVENOUS | Status: AC
  Filled 2022-11-10: qty 20

## 2022-11-10 MED ORDER — ACETAMINOPHEN 500 MG PO TABS
1000.0000 mg | ORAL_TABLET | ORAL | Status: AC
  Administered 2022-11-10: 1000 mg via ORAL
  Filled 2022-11-10: qty 2

## 2022-11-10 MED ORDER — MAGIC MOUTHWASH: 15.0000 mL | Freq: Four times a day (QID) | ORAL | Status: DC | PRN

## 2022-11-10 MED ORDER — KETOROLAC TROMETHAMINE 30 MG/ML IJ SOLN
INTRAMUSCULAR | Status: AC
  Filled 2022-11-10: qty 1

## 2022-11-10 MED ORDER — FENTANYL CITRATE (PF) 100 MCG/2ML IJ SOLN
INTRAMUSCULAR | Status: DC | PRN
  Administered 2022-11-10: 100 ug via INTRAVENOUS

## 2022-11-10 MED ORDER — ONDANSETRON HCL 4 MG/2ML IJ SOLN
4.0000 mg | Freq: Four times a day (QID) | INTRAMUSCULAR | Status: DC | PRN
  Administered 2022-11-10 – 2022-11-17 (×11): 4 mg via INTRAVENOUS
  Filled 2022-11-10 (×13): qty 2

## 2022-11-10 MED ORDER — ALVIMOPAN 12 MG PO CAPS
12.0000 mg | ORAL_CAPSULE | ORAL | Status: AC
  Administered 2022-11-10: 12 mg via ORAL
  Filled 2022-11-10: qty 1

## 2022-11-10 MED ORDER — DIPHENHYDRAMINE HCL 12.5 MG/5ML PO ELIX: 12.5000 mg | ORAL_SOLUTION | Freq: Four times a day (QID) | ORAL | Status: DC | PRN

## 2022-11-10 MED ORDER — ACETAMINOPHEN 500 MG PO TABS
1000.0000 mg | ORAL_TABLET | Freq: Four times a day (QID) | ORAL | Status: DC
  Administered 2022-11-10 – 2022-11-13 (×13): 1000 mg via ORAL
  Filled 2022-11-10 (×13): qty 2

## 2022-11-10 MED ORDER — SODIUM CHLORIDE 0.9 % IV SOLN: Freq: Three times a day (TID) | INTRAVENOUS | Status: DC | PRN

## 2022-11-10 MED ORDER — STERILE WATER FOR IRRIGATION IR SOLN
Status: DC | PRN
  Administered 2022-11-10: 1000 mL

## 2022-11-10 MED ORDER — BUPIVACAINE LIPOSOME 1.3 % IJ SUSP: 20.0000 mL | Freq: Once | INTRAMUSCULAR | Status: DC

## 2022-11-10 MED ORDER — AMISULPRIDE (ANTIEMETIC) 5 MG/2ML IV SOLN: 10.0000 mg | Freq: Once | INTRAVENOUS | Status: DC | PRN

## 2022-11-10 MED ORDER — ONDANSETRON HCL 4 MG/2ML IJ SOLN
INTRAMUSCULAR | Status: DC | PRN
  Administered 2022-11-10: 4 mg via INTRAVENOUS

## 2022-11-10 MED ORDER — LACTATED RINGERS IV SOLN: INTRAVENOUS | Status: DC

## 2022-11-10 MED ORDER — STERILE WATER FOR INJECTION IJ SOLN
INTRAMUSCULAR | Status: DC | PRN
  Administered 2022-11-10: 15 mL

## 2022-11-10 MED ORDER — MELATONIN 3 MG PO TABS
3.0000 mg | ORAL_TABLET | Freq: Every evening | ORAL | Status: DC | PRN
  Administered 2022-11-12: 3 mg via ORAL
  Filled 2022-11-10 (×2): qty 1

## 2022-11-10 MED ORDER — ESCITALOPRAM OXALATE 20 MG PO TABS
20.0000 mg | ORAL_TABLET | Freq: Every day | ORAL | Status: DC
  Administered 2022-11-10 – 2022-11-13 (×4): 20 mg via ORAL
  Filled 2022-11-10 (×4): qty 1

## 2022-11-10 MED ORDER — HYDROMORPHONE HCL 1 MG/ML IJ SOLN
0.5000 mg | INTRAMUSCULAR | Status: DC | PRN
  Administered 2022-11-12 – 2022-11-13 (×4): 1 mg via INTRAVENOUS
  Filled 2022-11-10 (×5): qty 1

## 2022-11-10 MED ORDER — EPHEDRINE SULFATE-NACL 50-0.9 MG/10ML-% IV SOSY
PREFILLED_SYRINGE | INTRAVENOUS | Status: DC | PRN
  Administered 2022-11-10 (×6): 5 mg via INTRAVENOUS

## 2022-11-10 MED ORDER — STERILE WATER FOR INJECTION IJ SOLN
INTRAMUSCULAR | Status: AC
  Filled 2022-11-10: qty 10

## 2022-11-10 MED ORDER — LACTATED RINGERS IV SOLN: INTRAVENOUS | Status: DC | PRN

## 2022-11-10 MED ORDER — MIDAZOLAM HCL 2 MG/2ML IJ SOLN
INTRAMUSCULAR | Status: AC
  Filled 2022-11-10: qty 2

## 2022-11-10 MED ORDER — ALVIMOPAN 12 MG PO CAPS: 12.0000 mg | ORAL_CAPSULE | Freq: Two times a day (BID) | ORAL | Status: DC

## 2022-11-10 MED ORDER — TRAMADOL HCL 50 MG PO TABS
50.0000 mg | ORAL_TABLET | Freq: Four times a day (QID) | ORAL | Status: DC | PRN
  Administered 2022-11-11 – 2022-11-13 (×4): 100 mg via ORAL
  Filled 2022-11-10 (×5): qty 2

## 2022-11-10 MED ORDER — SODIUM CHLORIDE 0.9 % IR SOLN
Status: DC | PRN
  Administered 2022-11-10: 1000 mL

## 2022-11-10 MED ORDER — ENSURE PRE-SURGERY PO LIQD: 592.0000 mL | Freq: Once | ORAL | Status: DC

## 2022-11-10 MED ORDER — CALCIUM POLYCARBOPHIL 625 MG PO TABS
625.0000 mg | ORAL_TABLET | Freq: Two times a day (BID) | ORAL | Status: DC
  Administered 2022-11-10 – 2022-11-13 (×7): 625 mg via ORAL
  Filled 2022-11-10 (×7): qty 1

## 2022-11-10 MED ORDER — SODIUM CHLORIDE 0.9% FLUSH: 3.0000 mL | INTRAVENOUS | Status: DC | PRN

## 2022-11-10 MED ORDER — NEOMYCIN SULFATE 500 MG PO TABS: 1000.0000 mg | ORAL_TABLET | ORAL | Status: DC

## 2022-11-10 MED ORDER — ENOXAPARIN SODIUM 40 MG/0.4ML IJ SOSY
40.0000 mg | PREFILLED_SYRINGE | INTRAMUSCULAR | Status: DC
  Administered 2022-11-11 – 2022-11-14 (×4): 40 mg via SUBCUTANEOUS
  Filled 2022-11-10 (×4): qty 0.4

## 2022-11-10 MED ORDER — OXYCODONE HCL 5 MG/5ML PO SOLN: 5.0000 mg | Freq: Once | ORAL | Status: AC | PRN

## 2022-11-10 MED ORDER — ORAL CARE MOUTH RINSE: 15.0000 mL | Freq: Once | OROMUCOSAL | Status: AC

## 2022-11-10 MED ORDER — OXYCODONE HCL 5 MG PO TABS
5.0000 mg | ORAL_TABLET | Freq: Once | ORAL | Status: AC | PRN
  Administered 2022-11-10: 5 mg via ORAL

## 2022-11-10 MED ORDER — ONDANSETRON HCL 4 MG PO TABS
4.0000 mg | ORAL_TABLET | Freq: Four times a day (QID) | ORAL | Status: DC | PRN
  Administered 2022-11-11 – 2022-11-12 (×2): 4 mg via ORAL
  Filled 2022-11-10 (×2): qty 1

## 2022-11-10 MED ORDER — METRONIDAZOLE 500 MG PO TABS: 1000.0000 mg | ORAL_TABLET | ORAL | Status: DC

## 2022-11-10 MED ORDER — 0.9 % SODIUM CHLORIDE (POUR BTL) OPTIME
TOPICAL | Status: DC | PRN
  Administered 2022-11-10: 2000 mL

## 2022-11-10 MED ORDER — DIPHENHYDRAMINE HCL 50 MG/ML IJ SOLN: 12.5000 mg | Freq: Four times a day (QID) | INTRAMUSCULAR | Status: DC | PRN

## 2022-11-10 MED ORDER — INDOCYANINE GREEN 25 MG IV SOLR
INTRAVENOUS | Status: DC | PRN
  Administered 2022-11-10: 6.25 mg via INTRAVENOUS

## 2022-11-10 MED ORDER — PHENYLEPHRINE HCL-NACL 20-0.9 MG/250ML-% IV SOLN
INTRAVENOUS | Status: DC | PRN
  Administered 2022-11-10: 20 ug/min via INTRAVENOUS

## 2022-11-10 MED ORDER — ALUM & MAG HYDROXIDE-SIMETH 200-200-20 MG/5ML PO SUSP: 30.0000 mL | Freq: Four times a day (QID) | ORAL | Status: DC | PRN

## 2022-11-10 MED ORDER — FENTANYL CITRATE (PF) 100 MCG/2ML IJ SOLN
INTRAMUSCULAR | Status: AC
  Filled 2022-11-10: qty 2

## 2022-11-10 MED ORDER — DEXAMETHASONE SODIUM PHOSPHATE 10 MG/ML IJ SOLN
INTRAMUSCULAR | Status: AC
  Filled 2022-11-10: qty 1

## 2022-11-10 MED ORDER — FENTANYL CITRATE PF 50 MCG/ML IJ SOSY: 25.0000 ug | PREFILLED_SYRINGE | INTRAMUSCULAR | Status: DC | PRN

## 2022-11-10 MED ORDER — METOPROLOL TARTRATE 5 MG/5ML IV SOLN: 5.0000 mg | Freq: Four times a day (QID) | INTRAVENOUS | Status: DC | PRN

## 2022-11-10 MED ORDER — SODIUM CHLORIDE (PF) 0.9 % IJ SOLN
INTRAMUSCULAR | Status: DC | PRN
  Administered 2022-11-10: 30 mL

## 2022-11-10 MED ORDER — BUPIVACAINE-EPINEPHRINE (PF) 0.5% -1:200000 IJ SOLN
INTRAMUSCULAR | Status: AC
  Filled 2022-11-10: qty 30

## 2022-11-10 MED ORDER — KETAMINE HCL 10 MG/ML IJ SOLN
INTRAMUSCULAR | Status: AC
  Filled 2022-11-10: qty 1

## 2022-11-10 MED ORDER — LIP MEDEX EX OINT
TOPICAL_OINTMENT | Freq: Two times a day (BID) | CUTANEOUS | Status: DC
  Administered 2022-11-11 – 2022-11-14 (×3): 75 via TOPICAL
  Administered 2022-11-15 – 2022-11-20 (×6): 1 via TOPICAL
  Filled 2022-11-10: qty 7

## 2022-11-10 MED ORDER — KETAMINE HCL 10 MG/ML IJ SOLN
INTRAMUSCULAR | Status: DC | PRN
  Administered 2022-11-10 (×2): 20 mg via INTRAVENOUS

## 2022-11-10 MED ORDER — BUPIVACAINE LIPOSOME 1.3 % IJ SUSP
INTRAMUSCULAR | Status: AC
  Filled 2022-11-10: qty 20

## 2022-11-10 MED ORDER — ENOXAPARIN SODIUM 40 MG/0.4ML IJ SOSY
40.0000 mg | PREFILLED_SYRINGE | Freq: Once | INTRAMUSCULAR | Status: AC
  Administered 2022-11-10: 40 mg via SUBCUTANEOUS
  Filled 2022-11-10: qty 0.4

## 2022-11-10 MED ORDER — OXYCODONE HCL 5 MG PO TABS
ORAL_TABLET | ORAL | Status: AC
  Filled 2022-11-10: qty 1

## 2022-11-10 MED ORDER — ONDANSETRON HCL 4 MG/2ML IJ SOLN
INTRAMUSCULAR | Status: AC
  Filled 2022-11-10: qty 2

## 2022-11-10 MED ORDER — CHLORHEXIDINE GLUCONATE 0.12 % MT SOLN
15.0000 mL | Freq: Once | OROMUCOSAL | Status: AC
  Administered 2022-11-10: 15 mL via OROMUCOSAL

## 2022-11-10 MED ORDER — HYDRALAZINE HCL 20 MG/ML IJ SOLN: 10.0000 mg | INTRAMUSCULAR | Status: DC | PRN

## 2022-11-10 MED ORDER — METHOCARBAMOL 1000 MG/10ML IJ SOLN
1000.0000 mg | Freq: Four times a day (QID) | INTRAVENOUS | Status: DC | PRN
  Filled 2022-11-10: qty 10

## 2022-11-10 MED ORDER — ENSURE SURGERY PO LIQD
237.0000 mL | Freq: Two times a day (BID) | ORAL | Status: DC
  Administered 2022-11-11 – 2022-11-13 (×4): 237 mL via ORAL
  Filled 2022-11-10: qty 237

## 2022-11-10 MED ORDER — BUPIVACAINE LIPOSOME 1.3 % IJ SUSP
INTRAMUSCULAR | Status: DC | PRN
  Administered 2022-11-10: 20 mL

## 2022-11-10 MED ORDER — LACTATED RINGERS IR SOLN
Status: DC | PRN
  Administered 2022-11-10: 1000 mL

## 2022-11-10 SURGICAL SUPPLY — 124 items
ADAPTER GOLDBERG URETERAL (ADAPTER) IMPLANT
APPLIER CLIP 5 13 M/L LIGAMAX5 (MISCELLANEOUS)
APPLIER CLIP ROT 10 11.4 M/L (STAPLE)
BAG COUNTER SPONGE SURGICOUNT (BAG) ×1 IMPLANT
BAG URO CATCHER STRL LF (MISCELLANEOUS) ×1 IMPLANT
BLADE EXTENDED COATED 6.5IN (ELECTRODE) IMPLANT
CANNULA REDUC XI 12-8 STAPL (CANNULA)
CANNULA REDUCER 12-8 DVNC XI (CANNULA) IMPLANT
CATH URETL OPEN END 6FR 70 (CATHETERS) IMPLANT
CELLS DAT CNTRL 66122 CELL SVR (MISCELLANEOUS) IMPLANT
CHLORAPREP W/TINT 26 (MISCELLANEOUS) IMPLANT
CLIP APPLIE 5 13 M/L LIGAMAX5 (MISCELLANEOUS) IMPLANT
CLIP APPLIE ROT 10 11.4 M/L (STAPLE) IMPLANT
CLOTH BEACON ORANGE TIMEOUT ST (SAFETY) ×1 IMPLANT
COVER SURGICAL LIGHT HANDLE (MISCELLANEOUS) ×2 IMPLANT
COVER TIP SHEARS 8 DVNC (MISCELLANEOUS) ×1 IMPLANT
COVER TIP SHEARS 8MM DA VINCI (MISCELLANEOUS) ×1
DEVICE TROCAR PUNCTURE CLOSURE (ENDOMECHANICALS) IMPLANT
DRAIN CHANNEL 19F RND (DRAIN) IMPLANT
DRAPE ARM DVNC X/XI (DISPOSABLE) ×4 IMPLANT
DRAPE COLUMN DVNC XI (DISPOSABLE) ×1 IMPLANT
DRAPE DA VINCI XI ARM (DISPOSABLE) ×4
DRAPE DA VINCI XI COLUMN (DISPOSABLE) ×1
DRAPE SURG IRRIG POUCH 19X23 (DRAPES) ×1 IMPLANT
DRSG OPSITE POSTOP 4X10 (GAUZE/BANDAGES/DRESSINGS) IMPLANT
DRSG OPSITE POSTOP 4X6 (GAUZE/BANDAGES/DRESSINGS) IMPLANT
DRSG OPSITE POSTOP 4X8 (GAUZE/BANDAGES/DRESSINGS) IMPLANT
DRSG TEGADERM 2-3/8X2-3/4 SM (GAUZE/BANDAGES/DRESSINGS) ×5 IMPLANT
DRSG TEGADERM 4X4.75 (GAUZE/BANDAGES/DRESSINGS) IMPLANT
ELECT PENCIL ROCKER SW 15FT (MISCELLANEOUS) ×1 IMPLANT
ELECT REM PT RETURN 15FT ADLT (MISCELLANEOUS) ×1 IMPLANT
ENDOLOOP SUT PDS II  0 18 (SUTURE)
ENDOLOOP SUT PDS II 0 18 (SUTURE) IMPLANT
EVACUATOR SILICONE 100CC (DRAIN) IMPLANT
GAUZE SPONGE 2X2 8PLY STRL LF (GAUZE/BANDAGES/DRESSINGS) IMPLANT
GAUZE SPONGE 2X2 STRL 8-PLY (GAUZE/BANDAGES/DRESSINGS) ×1 IMPLANT
GLOVE ECLIPSE 8.0 STRL XLNG CF (GLOVE) ×3 IMPLANT
GLOVE INDICATOR 8.0 STRL GRN (GLOVE) ×3 IMPLANT
GLOVE SURG LX STRL 7.5 STRW (GLOVE) ×1 IMPLANT
GOWN SRG XL LVL 4 BRTHBL STRL (GOWNS) ×1 IMPLANT
GOWN STRL NON-REIN XL LVL4 (GOWNS) ×1
GOWN STRL REUS W/ TWL XL LVL3 (GOWN DISPOSABLE) ×4 IMPLANT
GOWN STRL REUS W/TWL XL LVL3 (GOWN DISPOSABLE) ×4
GRASPER SUT TROCAR 14GX15 (MISCELLANEOUS) IMPLANT
GUIDEWIRE ANG ZIPWIRE 038X150 (WIRE) IMPLANT
GUIDEWIRE STR DUAL SENSOR (WIRE) IMPLANT
HOLDER FOLEY CATH W/STRAP (MISCELLANEOUS) ×1 IMPLANT
IRRIG SUCT STRYKERFLOW 2 WTIP (MISCELLANEOUS) ×1
IRRIGATION SUCT STRKRFLW 2 WTP (MISCELLANEOUS) ×1 IMPLANT
KIT PROCEDURE DA VINCI SI (MISCELLANEOUS) ×1
KIT PROCEDURE DVNC SI (MISCELLANEOUS) ×1 IMPLANT
KIT SIGMOIDOSCOPE (SET/KITS/TRAYS/PACK) IMPLANT
KIT TURNOVER KIT A (KITS) IMPLANT
MANIFOLD NEPTUNE II (INSTRUMENTS) ×1 IMPLANT
NDL INSUFFLATION 14GA 120MM (NEEDLE) ×1 IMPLANT
NEEDLE INSUFFLATION 14GA 120MM (NEEDLE) ×1 IMPLANT
PACK CARDIOVASCULAR III (CUSTOM PROCEDURE TRAY) ×1 IMPLANT
PACK COLON (CUSTOM PROCEDURE TRAY) ×1 IMPLANT
PACK CYSTO (CUSTOM PROCEDURE TRAY) ×1 IMPLANT
PAD POSITIONING PINK XL (MISCELLANEOUS) ×1 IMPLANT
PROTECTOR NERVE ULNAR (MISCELLANEOUS) ×2 IMPLANT
RELOAD STAPLE 45 3.5 BLU DVNC (STAPLE) IMPLANT
RELOAD STAPLE 45 4.3 GRN DVNC (STAPLE) IMPLANT
RELOAD STAPLE 60 3.5 BLU DVNC (STAPLE) IMPLANT
RELOAD STAPLE 60 4.3 GRN DVNC (STAPLE) IMPLANT
RELOAD STAPLER 3.5X45 BLU DVNC (STAPLE) IMPLANT
RELOAD STAPLER 3.5X60 BLU DVNC (STAPLE) IMPLANT
RELOAD STAPLER 4.3X45 GRN DVNC (STAPLE) IMPLANT
RELOAD STAPLER 4.3X60 GRN DVNC (STAPLE) ×1 IMPLANT
RETRACTOR WND ALEXIS 18 MED (MISCELLANEOUS) IMPLANT
RTRCTR WOUND ALEXIS 18CM MED (MISCELLANEOUS)
SCISSORS LAP 5X35 DISP (ENDOMECHANICALS) ×1 IMPLANT
SEAL CANN UNIV 5-8 DVNC XI (MISCELLANEOUS) ×3 IMPLANT
SEAL XI 5MM-8MM UNIVERSAL (MISCELLANEOUS) ×3
SEALER VESSEL DA VINCI XI (MISCELLANEOUS) ×1
SEALER VESSEL EXT DVNC XI (MISCELLANEOUS) ×1 IMPLANT
SOLUTION ELECTROLUBE (MISCELLANEOUS) ×1 IMPLANT
SPIKE FLUID TRANSFER (MISCELLANEOUS) ×1 IMPLANT
STAPLER 45 DA VINCI SURE FORM (STAPLE)
STAPLER 45 SUREFORM DVNC (STAPLE) IMPLANT
STAPLER 60 DA VINCI SURE FORM (STAPLE) ×1
STAPLER 60 SUREFORM DVNC (STAPLE) IMPLANT
STAPLER CANNULA SEAL DVNC XI (STAPLE) ×1 IMPLANT
STAPLER CANNULA SEAL XI (STAPLE) ×1
STAPLER ECHELON POWER CIR 29 (STAPLE) IMPLANT
STAPLER ECHELON POWER CIR 31 (STAPLE) IMPLANT
STAPLER RELOAD 3.5X45 BLU DVNC (STAPLE)
STAPLER RELOAD 3.5X45 BLUE (STAPLE)
STAPLER RELOAD 3.5X60 BLU DVNC (STAPLE)
STAPLER RELOAD 3.5X60 BLUE (STAPLE)
STAPLER RELOAD 4.3X45 GREEN (STAPLE)
STAPLER RELOAD 4.3X45 GRN DVNC (STAPLE)
STAPLER RELOAD 4.3X60 GREEN (STAPLE) ×1
STAPLER RELOAD 4.3X60 GRN DVNC (STAPLE) ×1
STOPCOCK 4 WAY LG BORE MALE ST (IV SETS) ×2 IMPLANT
SURGILUBE 2OZ TUBE FLIPTOP (MISCELLANEOUS) IMPLANT
SUT MNCRL AB 4-0 PS2 18 (SUTURE) ×1 IMPLANT
SUT PDS AB 1 CT1 27 (SUTURE) ×2 IMPLANT
SUT PROLENE 0 CT 2 (SUTURE) IMPLANT
SUT PROLENE 2 0 KS (SUTURE) IMPLANT
SUT PROLENE 2 0 SH DA (SUTURE) IMPLANT
SUT SILK 2 0 (SUTURE) ×1
SUT SILK 2 0 SH CR/8 (SUTURE) IMPLANT
SUT SILK 2-0 18XBRD TIE 12 (SUTURE) IMPLANT
SUT SILK 3 0 (SUTURE)
SUT SILK 3 0 SH CR/8 (SUTURE) ×1 IMPLANT
SUT SILK 3-0 18XBRD TIE 12 (SUTURE) IMPLANT
SUT V-LOC BARB 180 2/0GR6 GS22 (SUTURE)
SUT VIC AB 3-0 SH 18 (SUTURE) IMPLANT
SUT VIC AB 3-0 SH 27 (SUTURE)
SUT VIC AB 3-0 SH 27XBRD (SUTURE) IMPLANT
SUT VICRYL 0 UR6 27IN ABS (SUTURE) ×1 IMPLANT
SUTURE V-LC BRB 180 2/0GR6GS22 (SUTURE) IMPLANT
SYR 20ML ECCENTRIC (SYRINGE) ×1 IMPLANT
SYS LAPSCP GELPORT 120MM (MISCELLANEOUS)
SYS WOUND ALEXIS 18CM MED (MISCELLANEOUS) ×1
SYSTEM LAPSCP GELPORT 120MM (MISCELLANEOUS) IMPLANT
SYSTEM WOUND ALEXIS 18CM MED (MISCELLANEOUS) ×1 IMPLANT
TOWEL OR NON WOVEN STRL DISP B (DISPOSABLE) ×1 IMPLANT
TRAY FOLEY MTR SLVR 16FR STAT (SET/KITS/TRAYS/PACK) ×1 IMPLANT
TROCAR ADV FIXATION 5X100MM (TROCAR) ×1 IMPLANT
TUBING CONNECTING 10 (TUBING) ×3 IMPLANT
TUBING INSUFFLATION 10FT LAP (TUBING) ×1 IMPLANT
TUBING UROLOGY SET (TUBING) IMPLANT

## 2022-11-10 NOTE — Op Note (Signed)
Operative Note  Preoperative diagnosis:  1.  Diverticulitis  Postoperative diagnosis: 1.  Diverticulitis  Procedure(s): 1.  Cystoscopy with bilateral ureteral catheterization with instillation of firefly  Surgeon: Link Snuffer, MD  Assistants: None  Anesthesia: General  Complications: None immediate  EBL: Minimal  Specimens: 1.  None  Drains/Catheters: 1.  Foley catheter   intraoperative findings: Normal anterior urethra.  Borderline obstructing prostate.  Normal bladder mucosa.  Indication: 47 year old male with diverticulitis presents for colon resection.  Bilateral ureteral instillation of firefly requested.  Description of procedure:  The patient was identified and consent was obtained.  The patient was taken to the operating room and placed in the supine position.  The patient was placed under general anesthesia.  Perioperative antibiotics were administered.  The patient was laced in dorsal lithotomy.  Patient was prepped and draped in a standard sterile fashion and a timeout was performed.  A 21 French rigid cystoscope was advanced into the urethra and into the bladder.  Complete cystoscopy was performed with findings noted above.  The left ureter was cannulated with a sensor wire which was advanced up to the kidney.  Open-ended ureteral catheter was advanced over the wire.  7.5 cc of firefly was then instilled into the ureter.  Same procedure was then performed on the right.  Scope was withdrawn and Foley catheter placed.  This concluded my portion of the operation.  Plan: As per general surgery

## 2022-11-10 NOTE — Consult Note (Signed)
H&P Physician requesting consult: Remo Lipps gross  Chief Complaint: Diverticulitis  History of Present Illness: 47 year old male with history of diverticulitis presents for robotic resection of colon.  Bilateral instillation of firefly requested.  Past Medical History:  Diagnosis Date   Anxiety disorder    Diverticulosis    with abcess 09/06/22   Meningitis    age 1   Past Surgical History:  Procedure Laterality Date   APPENDECTOMY  2005   IR RADIOLOGIST EVAL & MGMT  07/23/2022   KNEE ARTHROSCOPY Right 2005    Home Medications:  Medications Prior to Admission  Medication Sig Dispense Refill Last Dose   escitalopram (LEXAPRO) 20 MG tablet Take 20 mg by mouth daily.   11/09/2022   acetaminophen (TYLENOL) 325 MG tablet Take 2 tablets (650 mg total) by mouth every 6 (six) hours as needed for mild pain (or Fever >/= 101). (Patient not taking: Reported on 10/29/2022)   Not Taking   [DISCONTINUED] oxyCODONE (OXY IR/ROXICODONE) 5 MG immediate release tablet Take 1 tablet (5 mg total) by mouth every 6 (six) hours as needed for moderate pain. (Patient not taking: Reported on 10/29/2022) 15 tablet 0 Not Taking   Allergies:  Allergies  Allergen Reactions   Compazine [Prochlorperazine Edisylate] Other (See Comments)    Hallucination    Amoxil [Amoxicillin] Rash   Cipro [Ciprofloxacin Hcl] Rash   Penicillins Rash    History reviewed. No pertinent family history. Social History:  reports that he has never smoked. He has never used smokeless tobacco. He reports current alcohol use of about 1.0 standard drink of alcohol per week. He reports that he does not use drugs.  ROS: A complete review of systems was performed.  All systems are negative except for pertinent findings as noted. ROS   Physical Exam:  Vital signs in last 24 hours: Temp:  [98.3 F (36.8 C)] 98.3 F (36.8 C) (01/10 0657) Pulse Rate:  [70] 70 (01/10 0657) Resp:  [18] 18 (01/10 0657) BP: (120)/(89) 120/89 (01/10  0657) SpO2:  [97 %] 97 % (01/10 0657) Weight:  [92.1 kg] 92.1 kg (01/10 0709) General:  Alert and oriented, No acute distress HEENT: Normocephalic, atraumatic Neck: No JVD or lymphadenopathy Cardiovascular: Regular rate and rhythm Lungs: Regular rate and effort Abdomen: Soft, nontender, nondistended, no abdominal masses Back: No CVA tenderness Extremities: No edema Neurologic: Grossly intact  Laboratory Data:  Results for orders placed or performed during the hospital encounter of 11/10/22 (from the past 24 hour(s))  ABO/Rh     Status: None   Collection Time: 11/10/22  6:30 AM  Result Value Ref Range   ABO/RH(D)      O POS Performed at [facility], [place] [address], [place],  [address]    No results found for this or any previous visit (from the past 240 hour(s)). Creatinine: Recent Labs    11/05/22 0909  CREATININE 0.89    Impression/Assessment:  Diverticulitis  Plan:  Proceed with cystoscopy with bilateral ureteral catheterization with firefly instillation.  Marton Redwood, III 11/10/2022, 9:17 AM

## 2022-11-10 NOTE — Transfer of Care (Signed)
Immediate Anesthesia Transfer of Care Note  Patient: AWS SHERE  Procedure(s) Performed: ROBOTIC LOW ANTERIOR RECTOSIGMOID RESECTION, INTRAOPERATIVE ASSESSMENT OF PERFUSION USING FIREFLY, AND BILATERAL TAP BLOCK RIGID PROCTOSCOPY CYSTOSCOPY with FIREFLY INJECTION  Patient Location: PACU  Anesthesia Type:General  Level of Consciousness: awake, alert , and patient cooperative  Airway & Oxygen Therapy: Patient Spontanous Breathing and Patient connected to face mask oxygen  Post-op Assessment: Report given to RN and Post -op Vital signs reviewed and stable  Post vital signs: Reviewed and stable  Last Vitals:  Vitals Value Taken Time  BP 129/73 11/10/22 1121  Temp    Pulse 88 11/10/22 1124  Resp 14 11/10/22 1124  SpO2 100 % 11/10/22 1124  Vitals shown include unvalidated device data.  Last Pain:  Vitals:   11/10/22 0709  TempSrc:   PainSc: 0-No pain         Complications: No notable events documented.

## 2022-11-10 NOTE — H&P (Signed)
11/10/2022    REFERRING PHYSICIAN: Halford Chessman, MD  Patient Care Team: Halford Chessman, MD as PCP - General (Family Medicine) Johney Maine, Adrian Saran, MD as Consulting Provider (Colon and Rectal Surgery) [name], MD (Gastroenterology)  PROVIDER: Hollace Kinnier, MD  [facility] MRN: [id number] DOB: [birth year]  SUBJECTIVE   Chief Complaint: New Consultation   History of Present Illness: Carlos Stone is a 47 y.o. male who is seen today  as an office consultation at the request of Dr. Hilma Favors  for evaluation of New Consultation .   Pleasant active gentleman. Has had episode of left lower abdominal pain with discomfort and diagnosed with diverticulitis. He has had repeated flares. Worse in the past year. It was recommended he get a colonoscopy. He was trying to set that up with [facility] but then had worsening attacks that became so severe he was admitted. Found to have perforation and abscess. Stabilized with bowel rest and IV antibiotics and drain placement. Eventually improved and was discharged in early September. He had a follow-up drain study in late September which noted abscess resolved without any fistula. Recommendation made to consider colonoscopy and possibly surgery.  I think he was expecting me to be the one doing the colonoscopy. He has been off antibiotics for the past couple weeks. He is feeling better. Appetite improved. No fevers or chills. He normally can walk at least an hour without difficulty. He had an appendectomy in his 97s in an open fashion many decades ago. No other abdominal surgery. No cardiac or pulmonary issues. He does not smoke. No diabetes. Usually moves his bowels once a day. He feels he is close to baseline now.  Medical History:  Past Medical History:  Diagnosis Date  Anxiety   Patient Active Problem List  Diagnosis  Diverticulitis of intestine with perforation and abscess without bleeding   Past Surgical History:  Procedure Laterality Date   APPENDECTOMY  knee surgery    Allergies  Allergen Reactions  Ampicillin Rash  Ciprofloxacin Hcl Rash  Penicillins Rash   Current Outpatient Medications on File Prior to Visit  Medication Sig Dispense Refill  escitalopram oxalate (LEXAPRO) 20 MG tablet Take 20 mg by mouth once daily   No current facility-administered medications on file prior to visit.   Family History  Problem Relation Age of Onset  Coronary Artery Disease (Blocked arteries around heart) Father    Social History   Tobacco Use  Smoking Status Never  Smokeless Tobacco Never    Social History   Socioeconomic History  Marital status: Married  Tobacco Use  Smoking status: Never  Smokeless tobacco: Never  Substance and Sexual Activity  Alcohol use: Not Currently  Drug use: Never   ############################################################  Review of Systems: A complete review of systems (ROS) was obtained from the patient. I have reviewed this information and discussed as appropriate with the patient. See HPI as well for other pertinent ROS.  Constitutional: No fevers, chills, sweats. Weight stable Eyes: No vision changes, No discharge HENT: No sore throats, nasal drainage Lymph: No neck swelling, No bruising easily Pulmonary: No cough, productive sputum CV: No orthopnea, PND . No exertional chest/neck/shoulder/arm pain. Patient can walk 2-3 miles without difficulty.   GI: No personal nor family history of GI/colon cancer, inflammatory bowel disease, irritable bowel syndrome, allergy such as Celiac Sprue, dietary/dairy problems, colitis, ulcers nor gastritis. No recent sick contacts/gastroenteritis. No travel outside the country. No changes in diet.  Renal: No UTIs, No hematuria Genital: No drainage,  bleeding, masses Musculoskeletal: No severe joint pain. Good ROM major joints Skin: No sores or lesions Heme/Lymph: No easy bleeding. No swollen lymph nodes Neuro: No active seizures. No facial  droop Psych: No hallucinations. No agitation  OBJECTIVE   Vitals:  09/06/22 1034  BP: 116/76  Pulse: 91  Temp: 36.7 C (98.1 F)  SpO2: 98%  Weight: 91.1 kg (200 lb 12.8 oz)  Height: 175.3 cm ('5\' 9"'$ )   Body mass index is 29.65 kg/m.  PHYSICAL EXAM:  Constitutional: Not cachectic. Hygeine adequate. Vitals signs as above.  Eyes: No glasses. Vision adequate,Pupils reactive, normal extraocular movements. Sclera nonicteric Neuro: CN II-XII intact. No major focal sensory defects. No major motor deficits. Lymph: No head/neck/groin lymphadenopathy Psych: No severe agitation. No severe anxiety. Judgment & insight Adequate, Oriented x4, HENT: Normocephalic, Mucus membranes moist. No thrush. Hearing: adequate Neck: Supple, No tracheal deviation. No obvious thyromegaly Chest: No pain to chest wall compression. Good respiratory excursion. No audible wheezing CV: Pulses intact. regular. No major extremity edema Ext: No obvious deformity or contracture. Edema: Not present. No cyanosis Skin: No major subcutaneous nodules. Warm and dry Musculoskeletal: Severe joint rigidity not present. No obvious clubbing. No digital petechiae. Mobility: no assist device moving easily without restrictions  Abdomen: Flat Soft. Nondistended. Nontender. Hernia: Not present. Diastasis recti: Not present. No hepatomegaly. No splenomegaly.  Genital/Pelvic: Inguinal hernia: Not present. Inguinal lymph nodes: without lymphadenopathy nor hidradenitis.   Rectal: (Deferred)    ###################################################################  Labs, Imaging and Diagnostic Testing:  Located in 'Care Everywhere' section of Epic EMR chart  PRIOR [facility] CLINIC NOTES:  Located in [place]' section of Epic EMR chart  SURGERY NOTES:  Not applicable  PATHOLOGY:  Not applicable  Assessment and Plan:  DIAGNOSES:  Diagnoses and all orders for this visit:  Diverticulitis of small intestine with  perforation and abscess without bleeding  Other orders - polyethylene glycol (MIRALAX) powder; Take 233.75 g by mouth once for 1 dose Take according to your procedure prep instructions. - bisacodyL (DULCOLAX) 5 mg EC tablet; Take 4 tablets (20 mg total) by mouth once daily as needed for Constipation for up to 1 dose - metroNIDAZOLE (FLAGYL) 500 MG tablet; Take 2 tablets (1,000 mg total) by mouth 3 (three) times daily for 3 doses SEE BOWEL PREP INSTRUCTIONS: Take 2 tablets at 2pm, 3pm, and 10pm the day prior to your colon operation. - neomycin 500 mg tablet; Take 2 tablets (1,000 mg total) by mouth 3 (three) times daily for 3 doses SEE BOWEL PREP INSTRUCTIONS: Take 2 tablets at 2pm, 3pm, and 10pm the day prior to your colon operation.    ASSESSMENT/PLAN  Pleasant patient with recurrent episodes of diverticulitis. Complex attack warranting admission and drainage and IV antibiotics given perforation and abscess in Sept 2023. The drain is out since September 27.  I think he would benefit from segmental colonic resection. Good candidate for minimally invasive robotic approach.  The anatomy & physiology of the digestive tract was discussed. The pathophysiology of the colon was discussed. Natural history risks without surgery was discussed. I feel the risks of no intervention will lead to serious problems that outweigh the operative risks; therefore, I recommended a partial colectomy to remove the pathology. Minimally invasive (Robotic/Laparoscopic) & open techniques were discussed.   Risks such as bleeding, infection, abscess, leak, reoperation, injury to other organs, need for repair of tissues / organs, possible ostomy, hernia, heart attack, stroke, death, and other risks were discussed. I noted a good likelihood this  will help address the problem. Goals of post-operative recovery were discussed as well. Need for adequate nutrition, daily bowel regimen and healthy physical activity, to optimize  recovery was noted as well. We will work to minimize complications. Educational materials were available as well. Questions were answered. The patient expresses understanding & wishes to proceed with surgery.  I think standard of care is the patient get colonoscopy before surgery. He was trying to set up with [facility] gastrology until this complex attack warranted to be urgently hospitalized. I think he got confused and thought I was 1 doing that. I noted I would prefer that gastrology do that. Because the patient is rather healthy, hopefully we can set it up so that he can get colonoscopy the day before. That way patient would just get 1 bowel prep.  [name], MD, FACS, MASCRS Esophageal, Gastrointestinal & Colorectal Surgery Robotic and Minimally Invasive Surgery  [facility] [address] [place], [place] [address] [phone] Fax [phone] Main  CONTACT INFORMATION:  Weekday (9AM-5PM): Call [facility] main office at [phone]  Weeknight (5PM-9AM) or Weekend/Holiday: Check [email or web address] (password " [email or web address]") for General Surgery [facility] coverage  (Please, do not use SecureChat as it is not reliable communication to reach operating surgeons for immediate patient care given surgeries/outpatient duties/clinic/cross-coverage/off post-call which would lead to a delay in care.  Epic staff messaging available for outptient concerns, but may not be answered for 48 hours or more).

## 2022-11-10 NOTE — Op Note (Signed)
11/10/2022  11:11 AM  PATIENT:  Carlos Stone  47 y.o. male  Patient Care Team: [name], MD as PCP - General (Family Medicine) [name], MD as Consulting Physician (General Surgery) [name], MD as Consulting Physician (Gastroenterology)  PRE-OPERATIVE DIAGNOSIS:  DIVERTICULITIS  POST-OPERATIVE DIAGNOSIS:  SIGMOID DIVERTICULITIS WITH ABSCESS  PROCEDURE:   ROBOTIC LOW ANTERIOR RECTOSIGMOID RESECTION INTRAOPERATIVE ASSESSMENT OF PERFUSION USING FIREFLY TRANSVERSUS ABDOMINIS PLANE (TAP) BLOCK - BILATERAL RIGID PROCTOSCOPY  SURGEON:  [name], MD  ASSISTANT: [name], MD, FACS, FASCRS An experienced assistant was required given the standard of surgical care given the complexity of the case.  This assistant was needed for exposure, dissection, suction, tissue approximation, retraction, perception, etc.   ANESTHESIA:     General  Regional TRANSVERSUS ABDOMINIS PLANE (TAP) nerve block for perioperative & postoperative pain control provided with liposomal bupivacaine (Experel) mixed with 0.25% bupivacaine as a Bilateral TAP block x 24m each side at the level of the transverse abdominis & preperitoneal spaces along the flank at the anterior axillary line, from subcostal ridge to iliac crest under laparoscopic guidance   Local field block at port sites & extraction wound  EBL:  Total I/O In: 1300 [I.V.:1300] Out: 310 [Urine:200; Other:60; Blood:50]  Delay start of Pharmacological VTE agent (>24hrs) due to surgical blood loss or risk of bleeding:  no  DRAINS: No  SPECIMEN:   RECTOSIGMOID COLON (open end proximal) DISTAL ANASTOMOTIC RING (final distal margin)  DISPOSITION OF SPECIMEN:  PATHOLOGY  COUNTS:  YES  PLAN OF CARE: Admit to inpatient   PATIENT DISPOSITION:  PACU - hemodynamically stable.  INDICATION:    Patient with history of diverticulitis.  Admission in the fall with abscess requiring percutaneous drainage and IV antibiotics.  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  I recommended segmental resection:  The anatomy & physiology of the digestive tract was discussed.  The pathophysiology was discussed.  Natural history risks without surgery was discussed.   I worked to give an overview of the disease and the frequent need to have multispecialty involvement.  I feel the risks of no intervention will lead to serious problems that outweigh the operative risks; therefore, I recommended a partial colectomy to remove the pathology.  Laparoscopic & open techniques were discussed.   Risks such as bleeding, infection, abscess, leak, reoperation, possible ostomy, hernia, heart attack, death, and other risks were discussed.  I noted a good likelihood this will help address the problem.   Goals of post-operative recovery were discussed as well.  We will work to minimize complications.  Educational materials on the pathology had been given in the office.  Questions were answered.    The patient expressed understanding & wished to proceed with surgery.  OR FINDINGS:   Patient had inflamed mid distal sigmoid colon with abscess between sigmoid colon and dome of the bladder.  No strong evidence of fistula or cystitis.  No obvious metastatic disease on visceral parietal peritoneum or liver.  The anastomosis rests 12 cm from the anal verge by rigid proctoscopy.   It is a descending colon to proximal rectal 29 EEA stapled anastomosis  CASE DATA:  Type of patient?: Elective [facility] Private Case  Status of Case? Elective Scheduled  Infection Present At Time Of Surgery (PATOS)?  ABSCESS  DESCRIPTION:   Informed consent was confirmed.  The patient underwent general anaesthesia without difficulty.  The patient was positioned appropriately.  VTE prevention in place.  Patient underwent cystoscopy and firefly ICG infiltration of bilateral ureters by [facility].  Please see Dr. Purvis Sheffield operative note.   He noted no obvious fistula nor major cystitis.  The patient was clipped, prepped, & draped in a sterile fashion.  Surgical timeout confirmed our plan.  The patient was positioned in reverse Trendelenburg.  Abdominal entry was gained using Varess technique at the left subcostal ridge on the anterior abdominal wall.  No elevated EtCO2 noted.  Port placed.  Camera inspection revealed no injury.  Extra ports were carefully placed under direct laparoscopic visualization.  Upon entering the abdomen (organ space), I encountered a phlegmon involving the sigmoid colon to the bladder. .   I reflected the greater omentum and the upper abdomen the small bowel in the upper abdomen.  The patient was carefully positioned.  The [employer]uitive daVinci robot was docked with camera & instruments carefully placed.  I mobilized the rectosigmoid colon off the anterior pelvis and encountered a small abscess between the mid sigmoid and dome of the bladder.  Aspirated and removed.  With this mobility we could elevate the rectosigmoid colon &  put the main pedicle on tension.  Could easily see the right ureter and the right retroperitoneum not involved with the rectosigmoid mesentery.  Left that in situ.  I scored the base of peritoneum of the medial side of the mesentery of the elevated left colon from the ligament of Treitz to the mid rectum.   I elevated the sigmoid mesentery and entered into the retro-mesenteric plane. We were able to identify the left ureter and gonadal vessels. We kept those posterior within the retroperitoneum and elevated the left colon mesentery off that. I did isolate the inferior mesenteric artery (IMA) pedicle but did not ligate it yet.  I continued distally and got into the avascular plane posterior to the mesorectum, sparing the nervi ergentes.. This allowed me to help mobilize the rectum as well by freeing the mesorectum off the sacrum.  I stayed away from the right and left ureters.  I kept the lateral  vascular pedicles to the rectum intact.   I skeletonized the lymph nodes off the inferior mesenteric artery pedicle.  I went down to its takeoff from the aorta.   I gained through the IMV and IMA to find the mid left retroperitoneum and see the left ureter and gonadal's to make sure they were staying in the retroperitoneum.  I was able to get a good critical view of the IMA and IMV.  Iisolated the inferior mesenteric vein off of the ligament of Treitz just cephalad to that as well.  After confirming the left ureter was out of the way, I went ahead and ligated the inferior mesenteric artery pedicle just near its takeoff from the aorta.  I did ligate the inferior mesenteric vein in a similar fashion.  We ensured hemostasis.  I continued medial to lateral dissection to free the left colon mesentery off the retroperitoneum going up towards the splenic flexure to allow good mobility and protect the colon mesentery.  I mobilized the left colon in a lateral to medial fashion off the retroperitoneum and sidewall attachments along the line of Toldt up towards the splenic flexure to ensure good mobilization of the remaining left colon to reach into the pelvis.  Greater omentum had some moderate lesions in the left upper quadrant which I freed off the anterior abdominal wall and pelvic sidewall.  It was somewhat foreshortened but untwisted and flattened out.  We  then focused on mesorectal dissection.  Freed the mesorectum off the presacral plane until I was distal to the concerning region.  Freed off peritoneum on the lateral sidewalls as well and transected the mesentery of the lateral pedicles to get distal to the area of concern.  Came around anteriorly such that I had good circumferential mesorectal excision and a good margin distal to the area of concern.  Inspected the bladder and confirmed no evidence of any active colovesical fistula nor cystotomy.  I skeletonized the mesorectum and transected at the  proximal/mid  rectal junction below inflammation.  using a robotic stapler.  We then chose a region for the proximal margin that would reach well for our planned anastomosis.  Transected the colon mesentery radially to preserve good collateral and marginal artery blood supply.  To access vascular perfusion of tissues, we asked anesthesia use intravenous  indocyanine green (ICG) with IV flush.  I switched to the NIR fluorescence (Firefly mode) imaging window on the daVinci robot platform.  We were able to see good light green visualization of blood vessels with good vascular perfusion of tissues, confirming good tissue perfusion of tissues (distal descending colon and proximal/mid rectum)  planned for anastomosis.  We created an extraction incision through a small Pfannenstiel incision in the suprapubic region.  Placed a wound protector.  I was able to eviscerate the rectosigmoid and descending colon out the wound.   I clamped the colon proximal to this area using a reusable pursestringer device.  Passed a 2-0 Keith needle. I transected at the descending/sigmoid junction with a scalpel. I got healthy bleeding mucosa.  We sent the rectosigmoid colon specimen off to go to pathology.  We sized the colon orifice.  I chose a 67m EEA anvil stapler system.  I reinforced the prolene pursestring with interrupted silk "belt loop" sutures.  I placed the anvil to the open end of the proximal remaining colon and closed around it using the pursestring.    We did copious irrigation with crystalloid solution.  Hemostasis was good.  The distal end of the remaining colon easily reached down to the rectal stump, therefore, splenic flexure mobilization was not needed.      Dr TMarcello Mooresscrubbed down and did gentle anal dilation and advanced the EEA stapler up the rectal stump. The spike was brought out at the provimal end of the rectal stump under direct visualization.  I  attached the anvil of the proximal colon the spike of the stapler. Anvil was  tightened down and held clamped for 60 seconds.  Orientation was confirmed such that there is no twisting of the colon nor small bowel underneath the mesenteric defect. No concerning tension.  The EEA stapler was fired and held clamped for 30 seconds. The stapler was released & removed. Blue stitch is in the proximal ring.  Care was taken to ensure no other structures were incorporated within this either.  We noted 2 excellent anastomotic rings.   The colon proximal to the anastomosis was then gently occluded. The pelvis was filled with sterile irrigation.  Dr TMarcello Moores did rigid proctoscopy noted the anastomosis was at 12 cm from the anal verge consistent with the proximal rectum.  There was a negative air leak test. There was no tension of mesentery or bowel at the anastomosis.   Tissues looked viable.  Ureters & bowel uninjured.  The anastomosis looked healthy. Greater omentum positioned down into the pelvis to help protect the anastomosis -however it could barely get  down to the Pfannenstiel incision.  Endoluminal gas was evacuated.  Ports & wound protector removed.  We changed gloves & redraped the patient per colon SSI prevention protocol.  We aspirated the sterile irrigation.  Hemostasis was good.  Sterile unused instruments were used from this point.  I closed the skin at the port sites using Monocryl stitch and sterile dressing.  We assured hemostasis and the former ostomy wound.  Wound irrigated.  I closed the posterior rectus fascia with 0 Vicryl suture.  Anterior rectus fascia was closed using #1 PDS transversely.  Sterile dressing placed.   Patient is being extubated go to recovery room. I had discussed postop care with the patient in detail the office & in the holding area. Instructions are written. I discussed operative findings, updated the patient's status, discussed probable steps to recovery, and gave postoperative recommendations to the patient's significant other, Kristy M.  Recommendations  were made.  Questions were answered.  She expressed understanding & appreciation.  [name], M.D., F.A.C.S. Gastrointestinal and Minimally Invasive Surgery [facility] [address] [place], [place] [address] [phone] Main / Paging

## 2022-11-10 NOTE — Discharge Instructions (Addendum)
#######################################################  Ostomy Support Information  You've heard that people get along just fine with only one of their eyes, or one of their lungs, or one of their kidneys. But you also know that you have only one intestine and only one bladder, and that leaves you feeling awfully empty, both physically and emotionally: You think no other people go around without part of their intestine with the ends of their intestines sticking out through their abdominal walls.   YOU ARE NOT ALONE.  There are nearly three quarters of a million people in the [place] who have an ostomy; people who have had surgery to remove all or part of their colons or bladders.   There is even a national association, the [place] [employer] [place] with over 350 local affiliated support groups that are organized by volunteers who provide peer support and counseling. [employer] has a toll free telephone num-ber, [phone] and an educational, interactive website, www.ostomy.org   An ostomy is an opening in the belly (abdominal wall) made by surgery. Ostomates are people who have had this procedure. The opening (stoma) allows the kidney or bowel to grdischarge waste. An external pouch covers the stoma to collect waste. Pouches are are a simple bag and are odor free. Different companies have disposable or reusable pouches to fit one's lifestyle. An ostomy can either be temporary or permanent.   THERE ARE THREE MAIN TYPES OF OSTOMIES Colostomy. A colostomy is a surgically created opening in the large intestine (colon). Ileostomy. An ileostomy is a surgically created opening in the small intestine. Urostomy. A urostomy is a surgically created opening to divert urine away from the bladder.  OSTOMY Care  The following guidelines will make care of your colostomy easier. Keep this information close by for quick reference.  Helpful DIET hints Eat a well-balanced diet including vegetables and fresh  fruits. Eat on a regular schedule.  Drink at least 6 to 8 glasses of fluids daily. Eat slowly in a relaxed atmosphere. Chew your food thoroughly. Avoid chewing gum, smoking, and drinking from a straw. This will help decrease the amount of air you swallow, which may help reduce gas. Eating yogurt or drinking buttermilk may help reduce gas.  To control gas at night, do not eat after 8 p.m. This will give your bowel time to quiet down before you go to bed.  If gas is a problem, you can purchase Beano. Sprinkle Beano on the first bite of food before eating to reduce gas. It has no flavor and should not change the taste of your food. You can buy Beano over the counter at your local drugstore.  Foods like fish, onions, garlic, broccoli, asparagus, and cabbage produce odor. Although your pouch is odor-proof, if you eat these foods you may notice a stronger odor when emptying your pouch. If this is a concern, you may want to limit these foods in your diet.  If you have an ileostomy, you will have chronic diarrhea & need to drink more liquids to avoid getting dehydrated.  Consider antidiarrheal medicine like imodium (loperamide) or Lomotil to help slow down bowel movements / diarrhea into your ileostomy bag.  GETTING TO GOOD BOWEL HEALTH WITH AN ILEOSTOMY    With the colon bypassed & not in use, you will have small bowel diarrhea.   It is important to thicken & slow your bowel movements down.   The goal: 4-6 small BOWEL MOVEMENTS A DAY It is important to drink plenty of liquids to avoid  getting dehydrated  CONTROLLING ILEOSTOMY DIARRHEA  TAKE A FIBER SUPPLEMENT (FiberCon or Benefiner soluble fiber) twice a day - to thicken stools by absorbing excess fluid and retrain the intestines to act more normally.  Slowly increase the dose over a few weeks.  Too much fiber too soon can backfire and cause cramping & bloating.  TAKE AN IRON SUPPLEMENT twice a day to naturally constipate your bowels.  Usually  ferrous sulfate 326m twice a day)  TAKE ANTI-DIARRHEAL MEDICINES: Loperamide (Imodium) can slow down diarrhea.  Start with two tablets (= 4563m first and then try one tablet every 6 hours.  Can go up to 2 pills four times day (8 pills of 63m16max) Avoid if you are having fevers or severe pain.  If you are not better or start feeling worse, stop all medicines and call your doctor for advice LoMotil (Diphenoxylate / Atropine) is another medicine that can constipate & slow down bowel moevements Pepto Bismol (bismuth) can gently thicken bowels as well  If diarrhea is worse,: drink plenty of liquids and try simpler foods for a few days to avoid stressing your intestines further. Avoid dairy products (especially milk & ice cream) for a short time.  The intestines often can lose the ability to digest lactose when stressed. Avoid foods that cause gassiness or bloating.  Typical foods include beans and other legumes, cabbage, broccoli, and dairy foods.  Every person has some sensitivity to other foods, so listen to our body and avoid those foods that trigger problems for you.Call your doctor if you are getting worse or not better.  Sometimes further testing (cultures, endoscopy, X-ray studies, bloodwork, etc) may be needed to help diagnose and treat the cause of the diarrhea. Take extra anti-diarrheal medicines (maximum is 8 pills of 63mg66mperamide a day)   Tips for POUCHING an OSTOMY   Changing Your Pouch The best time to change your pouch is in the morning, before eating or drinking anything. Your stoma can function at any time, but it will function more after eating or drinking.   Applying the pouching system  Place all your equipment close at hand before removing your pouch.  Wash your hands.  Stand or sit in front of a mirror. Use the position that works best for you. Remember that you must keep the skin around the stoma wrinkle-free for a good seal.  Gently remove the used pouch (1-piece  system) or the pouch and old wafer (2-piece system). Empty the pouch into the toilet. Save the closure clip to use again.  Wash the stoma itself and the skin around the stoma. Your stoma may bleed a little when being washed. This is normal. Rinse and pat dry. You may use a wash cloth or soft paper towels (like Bounty), mild soap (like Dial, Safeguard, or IvorMongoliand water. Avoid soaps that contain perfumes or lotions.  For a new pouch (1-piece system) or a new wafer (2-piece system), measure your stoma using the stoma guide in each box of supplies.  Trace the shape of your stoma onto the back of the new pouch or the back of the new wafer. Cut out the opening. Remove the paper backing and set it aside.  Optional: Apply a skin barrier powder to surrounding skin if it is irritated (bare or weeping), and dust off the excess. Optional: Apply a skin-prep wipe (such as Skin Prep or All-Kare) to the skin around the stoma, and let it dry. Do not apply this solution if the  skin is irritated (red, tender, or broken) or if you have shaved around the stoma. Optional: Apply a skin barrier paste (such as Stomahesive, Coloplast, or Premium) around the opening cut in the back of the pouch or wafer. Allow it to dry for 30 to 60 seconds.  Hold the pouch (1-piece system) or wafer (2-piece system) with the sticky side toward your body. Make sure the skin around the stoma is wrinkle-free. Center the opening on the stoma, then press firmly to your abdomen (Fig. 4). Look in the mirror to check if you are placing the pouch, or wafer, in the right position. For a 2-piece system, snap the pouch onto the wafer. Make sure it snaps into place securely.  Place your hand over the stoma and the pouch or wafer for about 30 seconds. The heat from your hand can help the pouch or wafer stick to your skin.  Add deodorant (such as Super Banish or Nullo) to your pouch. Other options include food extracts such as vanilla oil and peppermint  extract. Add about 10 drops of the deodorant to the pouch. Then apply the closure clamp. Note: Do not use toxic  chemicals or commercial cleaning agents in your pouch. These substances may harm the stoma.  Optional: For extra seal, apply tape to all 4 sides around the pouch or wafer, as if you were framing a picture. You may use any brand of medical adhesive tape. Change your pouch every 5 to 7 days. Change it immediately if a leak occurs.  Wash your hands afterwards.  If you are wearing a 2-piece system, you may use 2 new pouches per week and alternate them. Rinse the pouch with mild soap and warm water and hang it to dry for the next day. Apply the fresh pouch. Alternate the 2 pouches like this for a week. After a week, change the wafer and begin with 2 new pouches. Place the old pouches in a plastic bag, and put them in the trash.   LIVING WITH AN OSTOMY  Emptying Your Pouch Empty your pouch when it is one-third full (of urine, stool, and/or gas). If you wait until your pouch is fuller than this, it will be more difficult to empty and more noticeable. When you empty your pouch, either put toilet paper in the toilet bowl first, or flush the toilet while you empty the pouch. This will reduce splashing. You can empty the pouch between your legs or to one side while sitting, or while standing or stooping. If you have a 2-piece system, you can snap off the pouch to empty it. Remember that your stoma may function during this time. If you wish to rinse your pouch after you empty it, a [place] baster can be helpful. When using a baster, squirt water up into the pouch through the opening at the bottom. With a 2-piece system, you can snap off the pouch to rinse it. After rinsing  your pouch, empty it into the toilet. When rinsing your pouch at home, put a few granules of Dreft soap in the rinse water. This helps lubricate and freshen your pouch. The inside of your pouch can be sprayed with non-stick cooking  oil (Pam spray). This may help reduce stool sticking to the inside of the pouch.  Bathing You may shower or bathe with your pouch on or off. Remember that your stoma may function during this time.  The materials you use to wash your stoma and the skin around it should be  clean, but they do not need to be sterile.  Wearing Your Pouch During hot weather, or if you perspire a lot in general, wear a cover over your pouch. This may prevent a rash on your skin under the pouch. Pouch covers are sold at ostomy supply stores. Wear the pouch inside your underwear for better support. Watch your weight. Any gain or loss of 10 to 15 pounds or more can change the way your pouch fits.  Going Away From Home A collapsible cup (like those that come in travel kits) or a soft plastic squirt bottle with a pull-up top (like a travel bottle for shampoo) can be used for rinsing your pouch when you are away from home. Tilt the opening of the pouch at an upward angle when using a cup to rinse.  Carry wet wipes or extra tissues to use in public bathrooms.  Carry an extra pouching system with you at all times.  Never keep ostomy supplies in the glove compartment of your car. Extreme heat or cold can damage the skin barriers and adhesive wafers on the pouch.  When you travel, carry your ostomy supplies with you at all times. Keep them within easy reach. Do not pack ostomy supplies in baggage that will be checked or otherwise separated from you, because your baggage might be lost. If you're traveling out of the country, it is helpful to have a letter stating that you are carrying ostomy supplies as a medical necessity.  If you need ostomy supplies while traveling, look in the yellow pages of the telephone book under "Surgical Supplies." Or call the local ostomy organization to find out where supplies are available.  Do not let your ostomy supplies get low. Always order new pouches before you use the last one.  Reducing  Odor Limit foods such as broccoli, cabbage, onions, fish, and garlic in your diet to help reduce odor. Each time you empty your pouch, carefully clean the opening of the pouch, both inside and outside, with toilet paper. Rinse your pouch 1 or 2 times daily after you empty it (see directions for emptying your pouch and going away from home). Add deodorant (such as Super Banish or Nullo) to your pouch. Use air deodorizers in your bathroom. Do not add aspirin to your pouch. Even though aspirin can help prevent odor, it could cause ulcers on your stoma.  When to call the doctor Call the doctor if you have any of the following symptoms: Purple, black, or white stoma Severe cramps lasting more than 6 hours Severe watery discharge from the stoma lasting more than 6 hours No output from the colostomy for 3 days Excessive bleeding from your stoma Swelling of your stoma to more than 1/2-inch larger than usual Pulling inward of your stoma below skin level Severe skin irritation or deep ulcers Bulging or other changes in your abdomen  When to call your ostomy nurse Call your ostomy/enterostomal therapy (WOCN) nurse if any of the following occurs: Frequent leaking of your pouching system Change in size or appearance of your stoma, causing discomfort or problems with your pouch Skin rash or rawness Weight gain or loss that causes problems with your pouch     FREQUENTLY ASKED QUESTIONS   Why haven't you met any of these folks who have an ostomy?  Well, maybe you have! You just did not recognize them because an ostomy doesn't show. It can be kept secret if you wish. Why, maybe some of your best friends, office  associates or neighbors have an ostomy ... you never can tell. People facing ostomy surgery have many quality-of-life questions like: Will you bulge? Smell? Make noises? Will you feel waste leaving your body? Will you be a captive of the toilet? Will you starve? Be a social outcast? Get/stay  married? Have babies? Easily bathe, go swimming, bend over?  OK, let's look at what you can expect:   Will you bulge?  Remember, without part of the intestine or bladder, and its contents, you should have a flatter tummy than before. You can expect to wear, with little exception, what you wore before surgery ... and this in-cludes tight clothing and bathing suits.   Will you smell?  Today, thanks to modern odor proof pouching systems, you can walk into an ostomy support group meeting and not smell anything that is foul or offensive. And, for those with an ileostomy or colostomy who are concerned about odor when emptying their pouch, there are in-pouch deodorants that can be used to eliminate any waste odors that may exist.   Will you make noises?  Everyone produces gas, especially if they are an air-swallower. But intestinal sounds that occur from time to time are no differ-ent than a gurgling tummy, and quite often your clothing will muffle any sounds.   Will you feel the waste discharges?  For those with a colostomy or ileostomy there might be a slight pressure when waste leaves your body, but understand that the intestines have no nerve endings, so there will be no unpleasant sensations. Those with a urostomy will probably be unaware of any kidney drainage.   Will you be a captive of the toilet?  Immediately post-op you will spend more time in the bathroom than you will after your body recovers from surgery. Every person is different, but on average those with an ileostomy or urostomy may empty their pouches 4 to 6 times a day; a little  less if you have a colostomy. The average wear time between pouch system changes is 3 to 5 days and the changing process should take less than 30 minutes.   Will I need to be on a special diet? Most people return to their normal diet when they have recovered from surgery. Be sure to chew your food well, eat a well-balanced diet and drink plenty of fluids. If  you experience problems with a certain food, wait a couple of weeks and try it again.  Will there be odor and noises? Pouching systems are designed to be odor-proof or odor-resistant. There are deodorants that can be used in the pouch. Medications are also available to help reduce odor. Limit gas-producing foods and carbonated beverages. You will experience less gas and fewer noises as you heal from surgery.  How much time will it take to care for my ostomy? At first, you may spend a lot of time learning about your ostomy and how to take care of it. As you become more comfortable and skilled at changing the pouching system, it will take very little time to care for it.   Will I be able to return to work? People with ostomies can perform most jobs. As soon as you have healed from surgery, you should be able to return to work. Heavy lifting (more than 10 pounds) may be discouraged.   What about intimacy? Sexual relationships and intimacy are important and fulfilling aspects of your life. They should continue after ostomy surgery. Intimacy-related concerns should be discussed openly between you and  your partner.   Can I wear regular clothing? You do not need to wear special clothing. Ostomy pouches are fairly flat and barely noticeable. Elastic undergarments will not hurt the stoma or prevent the ostomy from functioning.   Can I participate in sports? An ostomy should not limit your involvement in sports. Many people with ostomies are runners, skiers, swimmers or participate in other active lifestyles. Talk with your caregiver first before doing heavy physical activity.  Will you starve?  Not if you follow doctor's orders at each stage of your post-op adjustment. There is no such thing as an "ostomy diet". Some people with an ostomy will be able to eat and tolerate anything; others may find diffi-culty with some foods. Each person is an individual and must determine, by trial, what is best for  them. A good practice for all is to drink plenty of water.   Will you be a social outcast?  Have you met anyone who has an ostomy and is a social outcast? Why should you be the first? Only your attitude and self image will effect how you are treated. No confi-dent person is an Occupational psychologist.    PROFESSIONAL HELP   Resources are available if you need help or have questions about your ostomy.   Specially trained nurses called Wound, Ostomy Continence Nurses (WOCN) are available for consultation in most major medical centers.  Consider getting an ostomy consult at an outpatient ostomy clinic.   [facility] has an [facility] run by an Programmer, systems at the [place].  [phone]. [facility] can help set up an appointment   The [employer] (UOA) is a group made up of many local chapters throughout the [place]. These local groups hold meetings and provide support to prospective and existing ostomates. They sponsor educational events and have qualified visitors to make personal or telephone visits. Contact the UOA for the chapter nearest you and for other educational publications.  More detailed information can be found in Colostomy Guide, a publication of the [employer] (UOA). Contact UOA at [phone] or visit their web site at [email or web address] The website contains links to other sites, suppliers and resources.  Tree surgeon Start Services: Start at the website to enlist for support.  Your Wound Ostomy (WOCN) nurse may have started this process. [email or web address]://[email or web address].[email or web address]/securestart Secure Start services are designed to support people as they live their lives with an ostomy or neurogenic bladder. Enrolling is easy and at no cost to the patient. We realize that each person's needs and life journey are different. Through Secure Start services, we want to help people live their life, their  way.  #######################################################   SURGERY: POST OP INSTRUCTIONS (Surgery for small bowel obstruction, colon resection, etc)   ######################################################################  EAT Gradually transition to a high fiber diet with a fiber supplement over the next few days after discharge  WALK Walk an hour a day.  Control your pain to do that.    CONTROL PAIN Control pain so that you can walk, sleep, tolerate sneezing/coughing, go up/down stairs.  HAVE A BOWEL MOVEMENT DAILY Keep your bowels regular to avoid problems.  OK to try a laxative to override constipation.  OK to use an antidairrheal to slow down diarrhea.  Call if not better after 2 tries  CALL IF YOU HAVE PROBLEMS/CONCERNS Call if you are still struggling despite following these instructions. Call if you have concerns not answered by these instructions  ######################################################################  DIET Follow a light diet the first few days at home.  Start with a bland diet such as soups, liquids, starchy foods, low fat foods, etc.  If you feel full, bloated, or constipated, stay on a ful liquid or pureed/blenderized diet for a few days until you feel better and no longer constipated. Be sure to drink plenty of fluids every day to avoid getting dehydrated (feeling dizzy, not urinating, etc.). Gradually add a fiber supplement to your diet over the next week.  Gradually get back to a regular solid diet.  Avoid fast food or heavy meals the first week as you are more likely to get nauseated. It is expected for your digestive tract to need a few months to get back to normal.  It is common for your bowel movements and stools to be irregular.  You will have occasional bloating and cramping that should eventually fade away.  Until you are eating solid food normally, off all pain medications, and back to regular activities; your bowels will not be  normal. Focus on eating a low-fat, high fiber diet the rest of your life (See Getting to [place], below).  CARE of your INCISION or WOUND  It is good for closed incisions and even open wounds to be washed every day.  Shower every day.  Short baths are fine.  Wash the incisions and wounds clean with soap & water.    You may leave closed incisions open to air if it is dry.   You may cover the incision with clean gauze & replace it after your daily shower for comfort.  TEGADERM:  You have clear gauze band-aid dressings over your closed incision(s).  Remove the dressings 3 days after surgery.= 11/13/2022 Saturday    If you have an open wound with a wound vac, see wound vac care instructions.    ACTIVITIES as tolerated Start light daily activities --- self-care, walking, climbing stairs-- beginning the day after surgery.  Gradually increase activities as tolerated.  Control your pain to be active.  Stop when you are tired.  Ideally, walk several times a day, eventually an hour a day.   Most people are back to most day-to-day activities in a few weeks.  It takes 4-8 weeks to get back to unrestricted, intense activity. If you can walk 30 minutes without difficulty, it is safe to try more intense activity such as jogging, treadmill, bicycling, low-impact aerobics, swimming, etc. Save the most intensive and strenuous activity for last (Usually 4-8 weeks after surgery) such as sit-ups, heavy lifting, contact sports, etc.  Refrain from any intense heavy lifting or straining until you are off narcotics for pain control.  You will have off days, but things should improve week-by-week. DO NOT PUSH THROUGH PAIN.  Let pain be your guide: If it hurts to do something, don't do it.  Pain is your body warning you to avoid that activity for another week until the pain goes down. You may drive when you are no longer taking narcotic prescription pain medication, you can comfortably wear a seatbelt, and you  can safely make sudden turns/stops to protect yourself without hesitating due to pain. You may have sexual intercourse when it is comfortable. If it hurts to do something, stop.  MEDICATIONS Take your usually prescribed home medications unless otherwise directed.   Blood thinners:  Usually you can restart any strong blood thinners after the second postoperative day.  It is OK to take aspirin right away.  If you are on strong blood thinners (warfarin/Coumadin, Plavix, Xerelto, Eliquis, Pradaxa, etc), discuss with your surgeon, medicine PCP, and/or cardiologist for instructions on when to restart the blood thinner & if blood monitoring is needed (PT/INR blood check, etc).     PAIN CONTROL Pain after surgery or related to activity is often due to strain/injury to muscle, tendon, nerves and/or incisions.  This pain is usually short-term and will improve in a few months.  To help speed the process of healing and to get back to regular activity more quickly, DO THE FOLLOWING THINGS TOGETHER: Increase activity gradually.  DO NOT PUSH THROUGH PAIN Use Ice and/or Heat Try Gentle Massage and/or Stretching Take over the counter pain medication Take Narcotic prescription pain medication for more severe pain  Good pain control = faster recovery.  It is better to take more medicine to be more active than to stay in bed all day to avoid medications.  Increase activity gradually Avoid heavy lifting at first, then increase to lifting as tolerated over the next 6 weeks. Do not "push through" the pain.  Listen to your body and avoid positions and maneuvers than reproduce the pain.  Wait a few days before trying something more intense Walking an hour a day is encouraged to help your body recover faster and more safely.  Start slowly and stop when getting sore.  If you can walk 30 minutes without stopping or pain, you can try more intense activity (running, jogging, aerobics, cycling, swimming, treadmill, sex,  sports, weightlifting, etc.) Remember: If it hurts to do it, then don't do it! Use Ice and/or Heat You will have swelling and bruising around the incisions.  This will take several weeks to resolve. Ice packs or heating pads (6-8 times a day, 30-60 minutes at a time) will help sooth soreness & bruising. Some people prefer to use ice alone, heat alone, or alternate between ice & heat.  Experiment and see what works best for you.  Consider trying ice for the first few days to help decrease swelling and bruising; then, switch to heat to help relax sore spots and speed recovery. Shower every day.  Short baths are fine.  It feels good!  Keep the incisions and wounds clean with soap & water.   Try Gentle Massage and/or Stretching Massage at the area of pain many times a day Stop if you feel pain - do not overdo it Take over the counter pain medication This helps the muscle and nerve tissues become less irritable and calm down faster Choose ONE of the following over-the-counter anti-inflammatory medications: Acetaminophen 562m tabs (Tylenol) 1-2 pills with every meal and just before bedtime (avoid if you have liver problems or if you have acetaminophen in you narcotic prescription) Naproxen 2237mtabs (ex. Aleve, Naprosyn) 1-2 pills twice a day (avoid if you have kidney, stomach, IBD, or bleeding problems) Ibuprofen 20043mabs (ex. Advil, Motrin) 3-4 pills with every meal and just before bedtime (avoid if you have kidney, stomach, IBD, or bleeding problems) Take with food/snack several times a day as directed for at least 2 weeks to help keep pain / soreness down & more manageable. Take Narcotic prescription pain medication for more severe pain A prescription for strong pain control is often given to you upon discharge (for example: oxycodone/Percocet, hydrocodone/Norco/Vicodin, or tramadol/Ultram) Take your pain medication as prescribed. Be mindful that most narcotic prescriptions contain Tylenol  (acetaminophen) as well - avoid taking too much Tylenol. If you are having problems/concerns with  the prescription medicine (does not control pain, nausea, vomiting, rash, itching, etc.), please call us [phone] to see if we need to switch you to a different pain medicine that will work better for you and/or control your side effects better. If you need a refill on your pain medication, you must call the office before 4 pm and on weekdays only.  By federal law, prescriptions for narcotics cannot be called into a pharmacy.  They must be filled out on paper & picked up from our office by the patient or authorized caretaker.  Prescriptions cannot be filled after 4 pm nor on weekends.    WHEN TO CALL us [phone] Severe uncontrolled or worsening pain  Fever over 101 F (38.5 C) Concerns with the incision: Worsening pain, redness, rash/hives, swelling, bleeding, or drainage Reactions / problems with new medications (itching, rash, hives, nausea, etc.) Nausea and/or vomiting Difficulty urinating Difficulty breathing Worsening fatigue, dizziness, lightheadedness, blurred vision Other concerns If you are not getting better after two weeks or are noticing you are getting worse, contact our office [phone] for further advice.  We may need to adjust your medications, re-evaluate you in the office, send you to the emergency room, or see what other things we can do to help. The clinic staff is available to answer your questions during regular business hours (8:30am-5pm).  Please don't hesitate to call and ask to speak to one of our nurses for clinical concerns.    A surgeon from [facility] is always on call at the hospitals 24 hours/day If you have a medical emergency, go to the nearest emergency room or call 911.  FOLLOW UP in our office One the day of your discharge from the hospital (or the next business weekday), please call [facility] to set up or confirm an  appointment to see your surgeon in the office for a follow-up appointment.  Usually it is 2-3 weeks after your surgery.   If you have skin staples at your incision(s), let the office know so we can set up a time in the office for the nurse to remove them (usually around 10 days after surgery). Make sure that you call for appointments the day of discharge (or the next business weekday) from the hospital to ensure a convenient appointment time. IF YOU HAVE DISABILITY OR FAMILY LEAVE FORMS, BRING THEM TO THE OFFICE FOR PROCESSING.  DO NOT GIVE THEM TO YOUR DOCTOR.  [facility] [address] [place], [place], [place]  [address] ? [phone] - Main [phone] - [place],  [phone] - Fax [email or web address]    GETTING TO GOOD BOWEL HEALTH. It is expected for your digestive tract to need a few months to get back to normal.  It is common for your bowel movements and stools to be irregular.  You will have occasional bloating and cramping that should eventually fade away.  Until you are eating solid food normally, off all pain medications, and back to regular activities; your bowels will not be normal.   Avoiding constipation The goal: ONE SOFT BOWEL MOVEMENT A DAY!    Drink plenty of fluids.  Choose water first. TAKE A FIBER SUPPLEMENT EVERY DAY THE REST OF YOUR LIFE During your first week back home, gradually add back a fiber supplement every day Experiment which form you can tolerate.   There are many forms such as powders, tablets, wafers, gummies, etc Psyllium bran (Metamucil), methylcellulose (Citrucel), Miralax or Glycolax,  Benefiber, Flax Seed.  Adjust the dose week-by-week (1/2 dose/day to 6 doses a day) until you are moving your bowels 1-2 times a day.  Cut back the dose or try a different fiber product if it is giving you problems such as diarrhea or bloating. Sometimes a laxative is needed to help jump-start bowels if constipated until the  fiber supplement can help regulate your bowels.  If you are tolerating eating & you are farting, it is okay to try a gentle laxative such as double dose MiraLax, prune juice, or Milk of Magnesia.  Avoid using laxatives too often. Stool softeners can sometimes help counteract the constipating effects of narcotic pain medicines.  It can also cause diarrhea, so avoid using for too long. If you are still constipated despite taking fiber daily, eating solids, and a few doses of laxatives, call our office. Controlling diarrhea Try drinking liquids and eating bland foods for a few days to avoid stressing your intestines further. Avoid dairy products (especially milk & ice cream) for a short time.  The intestines often can lose the ability to digest lactose when stressed. Avoid foods that cause gassiness or bloating.  Typical foods include beans and other legumes, cabbage, broccoli, and dairy foods.  Avoid greasy, spicy, fast foods.  Every person has some sensitivity to other foods, so listen to your body and avoid those foods that trigger problems for you. Probiotics (such as active yogurt, Align, etc) may help repopulate the intestines and colon with normal bacteria and calm down a sensitive digestive tract Adding a fiber supplement gradually can help thicken stools by absorbing excess fluid and retrain the intestines to act more normally.  Slowly increase the dose over a few weeks.  Too much fiber too soon can backfire and cause cramping & bloating. It is okay to try and slow down diarrhea with a few doses of antidiarrheal medicines.   Bismuth subsalicylate (ex. Kayopectate, Pepto Bismol) for a few doses can help control diarrhea.  Avoid if pregnant.   Loperamide (Imodium) can slow down diarrhea.  Start with one tablet (68m) first.  Avoid if you are having fevers or severe pain.  ILEOSTOMY PATIENTS WILL HAVE CHRONIC DIARRHEA since their colon is not in use.    Drink plenty of liquids.  You will need to drink  even more glasses of water/liquid a day to avoid getting dehydrated. Record output from your ileostomy.  Expect to empty the bag every 3-4 hours at first.  Most people with a permanent ileostomy empty their bag 4-6 times at the least.   Use antidiarrheal medicine (especially Imodium) several times a day to avoid getting dehydrated.  Start with a dose at bedtime & breakfast.  Adjust up or down as needed.  Increase antidiarrheal medications as directed to avoid emptying the bag more than 8 times a day (every 3 hours). Work with your wound ostomy nurse to learn care for your ostomy.  See ostomy care instructions. TROUBLESHOOTING IRREGULAR BOWELS 1) Start with a soft & bland diet. No spicy, greasy, or fried foods.  2) Avoid gluten/wheat or dairy products from diet to see if symptoms improve. 3) Miralax 17gm or flax seed mixed in [place] water or juice-daily. May use 2-4 times a day as needed. 4) Gas-X, Phazyme, etc. as needed for gas & bloating.  5) Prilosec (omeprazole) over-the-counter as needed 6)  Consider probiotics (Align, Activa, etc) to help calm the bowels down  Call your doctor if you are getting worse or not getting  better.  Sometimes further testing (cultures, endoscopy, X-ray studies, CT scans, bloodwork, etc.) may be needed to help diagnose and treat the cause of the diarrhea. [facility], [place], [place], [place], [place]  [address] [phone] - Main.    [phone]  - Toll Free.   [phone] - Fax [email or web address]

## 2022-11-10 NOTE — Interval H&P Note (Signed)
History and Physical Interval Note:  11/10/2022 7:54 AM  Carlos Stone  has presented today for surgery, with the diagnosis of DIVERTICULITIS.  The various methods of treatment have been discussed with the patient and family. After consideration of risks, benefits and other options for treatment, the patient has consented to  Procedure(s): ROBOTIC RESECTION OF COLON RECTOSIGMOID (N/A) RIGID PROCTOSCOPY (N/A) CYSTOSCOPY with FIREFLY INJECTION (N/A) as a surgical intervention.  The patient's history has been reviewed, patient examined, no change in status, stable for surgery.  I have reviewed the patient's chart and labs.  Questions were answered to the patient's satisfaction.    I have re-reviewed the the patient's records, history, medications, and allergies.  I have re-examined the patient.  I again discussed intraoperative plans and goals of post-operative recovery.  The patient agrees to proceed.  Carlos Stone  November 16, 1975 [id number]  Patient Care Team: [name], MD as PCP - General (Family Medicine) [name], MD as Consulting Physician (General Surgery) [name], MD as Consulting Physician (Gastroenterology)  Patient Active Problem List   Diagnosis Date Noted   Diverticulitis of intestine with perforation and abscess without bleeding 07/07/2022   Diverticulitis 07/07/2022   Elevated transaminase level 09/24/2013   Hypokalemia 09/22/2013   Pleuritic chest pain 09/22/2013   Community acquired pneumonia 09/21/2013    Past Medical History:  Diagnosis Date   Anxiety disorder    Diverticulosis    with abcess 09/06/22   Meningitis    age 51    Past Surgical History:  Procedure Laterality Date   APPENDECTOMY  2005   IR RADIOLOGIST EVAL & MGMT  07/23/2022   KNEE ARTHROSCOPY Right 2005    Social History   Socioeconomic History   Marital status: Married    Spouse name: Not on file   Number of children: Not on file   Years of education: Not on file   Highest  education level: Not on file  Occupational History   Not on file  Tobacco Use   Smoking status: Never   Smokeless tobacco: Never  Vaping Use   Vaping Use: Never used  Substance and Sexual Activity   Alcohol use: Yes    Alcohol/week: 1.0 standard drink of alcohol    Types: 1 Glasses of wine per week    Comment: occasional   Drug use: No   Sexual activity: Yes  Other Topics Concern   Not on file  Social History Narrative   Not on file   Social Determinants of Health   Financial Resource Strain: Not on file  Food Insecurity: No Food Insecurity (07/07/2022)   Hunger Vital Sign    Worried About Running Out of Food in the Last Year: Never true    Ran Out of Food in the Last Year: Never true  Transportation Needs: No Transportation Needs (07/07/2022)   PRAPARE - Hydrologist (Medical): No    Lack of Transportation (Non-Medical): No  Physical Activity: Not on file  Stress: Not on file  Social Connections: Not on file  Intimate Partner Violence: Not At Risk (07/07/2022)   Humiliation, Afraid, Rape, and Kick questionnaire    Fear of Current or Ex-Partner: No    Emotionally Abused: No    Physically Abused: No    Sexually Abused: No    History reviewed. No pertinent family history.  Medications Prior to Admission  Medication Sig Dispense Refill Last Dose   escitalopram (LEXAPRO) 20 MG tablet Take 20 mg by  mouth daily.   11/09/2022   acetaminophen (TYLENOL) 325 MG tablet Take 2 tablets (650 mg total) by mouth every 6 (six) hours as needed for mild pain (or Fever >/= 101). (Patient not taking: Reported on 10/29/2022)   Not Taking   oxyCODONE (OXY IR/ROXICODONE) 5 MG immediate release tablet Take 1 tablet (5 mg total) by mouth every 6 (six) hours as needed for moderate pain. (Patient not taking: Reported on 10/29/2022) 15 tablet 0 Not Taking    Current Facility-Administered Medications  Medication Dose Route Frequency Provider Last Rate Last Admin    bupivacaine liposome (EXPAREL) 1.3 % injection 266 mg  20 mL Infiltration Once [name], MD       ertapenem [facility]) 1,000 mg in sodium chloride 0.9 % 100 mL IVPB  1 g Intravenous On Call to OR [name], MD       lactated ringers infusion   Intravenous Continuous Barnet Glasgow, MD 10 mL/hr at 11/10/22 0728 New Bag at 11/10/22 0728     Allergies  Allergen Reactions   Compazine [Prochlorperazine Edisylate] Other (See Comments)    Hallucination    Amoxil [Amoxicillin] Rash   Cipro [Ciprofloxacin Hcl] Rash   Penicillins Rash    BP 120/89   Pulse 70   Temp 98.3 F (36.8 C) (Oral)   Resp 18   Ht '5\' 9"'$  (1.753 m)   Wt 92.1 kg   SpO2 97%   BMI 29.98 kg/m   Labs: No results found for this or any previous visit (from the past 48 hour(s)).  Imaging / Studies: No results found.   [name], M.D., F.A.C.S. Gastrointestinal and Minimally Invasive Surgery [facility] [address] [place], [place] [address] [phone] Main / Paging  11/10/2022 7:54 AM    [name]

## 2022-11-10 NOTE — Consult Note (Signed)
[facility] Nurse requested for preoperative stoma site marking  Discussed surgical procedure and stoma creation with patient and family.  Explained role of the [facility] nurse team.  Provided the patient with educational booklet and provided samples of pouching options.  Answered patient and family questions.   Examined patient lying, sitting, and standing in order to place the marking in the patient's visual field, away from any creases or abdominal contour issues and within the rectus muscle.   Marked for colostomy in the LLQ  ___4_ cm to the left of the umbilicus and __2__QA below the umbilicus.  Marked for ileostomy in the RLQ  _3___cm to the right of the umbilicus and  __0__ cm below the umbilicus.   Patient's abdomen cleansed with CHG wipes at site markings by patient with wipe just prior to my arrival.  allowed to air dry prior to marking.Covered mark with thin film transparent dressing to preserve mark until date of surgery.   [facility] Nurse team will follow up with patient after surgery for continue ostomy care and teaching.  [place] MSN, [place], [place], [place]

## 2022-11-10 NOTE — Anesthesia Procedure Notes (Signed)
Procedure Name: Intubation Date/Time: 11/10/2022 8:50 AM  Performed by: Eben Burow, CRNAPre-anesthesia Checklist: Patient identified, Emergency Drugs available, Suction available, Patient being monitored and Timeout performed Patient Re-evaluated:Patient Re-evaluated prior to induction Oxygen Delivery Method: Circle system utilized Preoxygenation: Pre-oxygenation with 100% oxygen Induction Type: IV induction Ventilation: Mask ventilation without difficulty Laryngoscope Size: Mac and 4 Grade View: Grade I Tube type: Oral Tube size: 7.5 mm Number of attempts: 1 Airway Equipment and Method: Stylet Placement Confirmation: ETT inserted through vocal cords under direct vision, positive ETCO2 and breath sounds checked- equal and bilateral Secured at: 22 cm Tube secured with: Tape Dental Injury: Teeth and Oropharynx as per pre-operative assessment

## 2022-11-11 ENCOUNTER — Encounter (HOSPITAL_COMMUNITY): Payer: Self-pay | Admitting: Surgery

## 2022-11-11 LAB — CBC
HCT: 35.8 % — ABNORMAL LOW (ref 39.0–52.0)
Hemoglobin: 11.9 g/dL — ABNORMAL LOW (ref 13.0–17.0)
MCH: 28.8 pg (ref 26.0–34.0)
MCHC: 33.2 g/dL (ref 30.0–36.0)
MCV: 86.7 fL (ref 80.0–100.0)
Platelets: 283 10*3/uL (ref 150–400)
RBC: 4.13 MIL/uL — ABNORMAL LOW (ref 4.22–5.81)
RDW: 12.7 % (ref 11.5–15.5)
WBC: 13.5 10*3/uL — ABNORMAL HIGH (ref 4.0–10.5)
nRBC: 0 % (ref 0.0–0.2)

## 2022-11-11 LAB — BASIC METABOLIC PANEL
Anion gap: 8 (ref 5–15)
BUN: 15 mg/dL (ref 6–20)
CO2: 25 mmol/L (ref 22–32)
Calcium: 8.6 mg/dL — ABNORMAL LOW (ref 8.9–10.3)
Chloride: 105 mmol/L (ref 98–111)
Creatinine, Ser: 1.06 mg/dL (ref 0.61–1.24)
GFR, Estimated: >60 mL/min (ref >60)
Glucose, Bld: 167 mg/dL — ABNORMAL HIGH (ref 70–99)
Potassium: 3.5 mmol/L (ref 3.5–5.1)
Sodium: 138 mmol/L (ref 135–145)

## 2022-11-11 LAB — MAGNESIUM: Magnesium: 2 mg/dL (ref 1.7–2.4)

## 2022-11-11 MED ORDER — METOCLOPRAMIDE HCL 5 MG/ML IJ SOLN
5.0000 mg | Freq: Three times a day (TID) | INTRAMUSCULAR | Status: DC | PRN
  Administered 2022-11-11 – 2022-11-17 (×2): 10 mg via INTRAVENOUS
  Filled 2022-11-11 (×7): qty 2

## 2022-11-11 MED ORDER — TAB-A-VITE/IRON PO TABS
1.0000 | ORAL_TABLET | Freq: Every day | ORAL | Status: DC
  Administered 2022-11-11 – 2022-11-13 (×3): 1 via ORAL
  Filled 2022-11-11 (×4): qty 1

## 2022-11-11 MED ORDER — METOCLOPRAMIDE HCL 5 MG PO TABS: 5.0000 mg | ORAL_TABLET | Freq: Three times a day (TID) | ORAL | Status: DC | PRN

## 2022-11-11 NOTE — Anesthesia Postprocedure Evaluation (Signed)
Anesthesia Post Note  Patient: Carlos Stone  Procedure(s) Performed: ROBOTIC LOW ANTERIOR RECTOSIGMOID RESECTION, INTRAOPERATIVE ASSESSMENT OF PERFUSION USING FIREFLY, AND BILATERAL TAP BLOCK RIGID PROCTOSCOPY CYSTOSCOPY with FIREFLY INJECTION     Patient location during evaluation: PACU Anesthesia Type: General Level of consciousness: awake Pain management: pain level controlled Vital Signs Assessment: post-procedure vital signs reviewed and stable Respiratory status: spontaneous breathing, nonlabored ventilation and respiratory function stable Cardiovascular status: blood pressure returned to baseline and stable Postop Assessment: no apparent nausea or vomiting Anesthetic complications: no   No notable events documented.  Last Vitals:  Vitals:   11/11/22 0107 11/11/22 0611  BP: 108/65 110/71  Pulse: 94 67  Resp: 16 18  Temp: 37.3 C 36.9 C  SpO2: 99% 99%    Last Pain:  Vitals:   11/11/22 0611  TempSrc: Oral  PainSc:                  [name] [name]

## 2022-11-11 NOTE — Progress Notes (Addendum)
Verbal order by MD Marcello Moores for Compazine '10mg'$  IV every 6 hours PRN for nausea. Patient has an allergy to Compazine. MD Marcello Moores was notified to reorder different medication.

## 2022-11-11 NOTE — Progress Notes (Signed)
Pharmacy Brief Note - Alvimopan (Entereg)  The standing order set for alvimopan (Entereg) now includes an automatic order to discontinue the drug after the patient has had a bowel movement. The change was approved by the [place] and the Medical Executive Committee.  This patient has had a bowel movement documented by nursing. Therefore, alvimopan has been discontinued. If there are questions, please contact the pharmacy at [phone].  Thank you  Reuel Boom, PharmD, BCPS [phone] 11/11/2022, 12:25 PM

## 2022-11-11 NOTE — Progress Notes (Signed)
LENON KUENNEN [id number] 01-Oct-1976  CARE TEAM:  PCP: [name], MD  Outpatient Care Team: Patient Care Team: [name], MD as PCP - General (Family Medicine) [name], MD as Consulting Physician (General Surgery) [name], MD as Consulting Physician (Gastroenterology)  Inpatient Treatment Team: Treatment Team: Attending Provider: [name], MD; [place] Nurse: Nadara Mode, RN; Technician: Rutherford Nail, NT; Charge Nurse: Heloise Ochoa, RN; Charge Nurse: Aura Dials, RN; Utilization Review: Lacretia Leigh, RN; Pharmacist: Polly Cobia, [facility]   Problem List:   Principal Problem:   Diverticulitis with abscess s/p sigmoid colectomy 11/10/2022 Active Problems:   Colonic diverticular abscess   Diverticulitis   1 Day Post-Op  11/10/2022  POST-OPERATIVE DIAGNOSIS:  SIGMOID DIVERTICULITIS WITH ABSCESS   PROCEDURE:   ROBOTIC LOW ANTERIOR RECTOSIGMOID RESECTION INTRAOPERATIVE ASSESSMENT OF PERFUSION USING FIREFLY TRANSVERSUS ABDOMINIS PLANE (TAP) BLOCK - BILATERAL RIGID PROCTOSCOPY   SURGEON:  [name], MD   OR FINDINGS:   Patient had inflamed mid distal sigmoid colon with abscess between sigmoid colon and dome of the bladder.  No strong evidence of fistula or cystitis.   No obvious metastatic disease on visceral parietal peritoneum or liver.   The anastomosis rests 12 cm from the anal verge by rigid proctoscopy.   It is a descending colon to proximal rectal 29 EEA stapled anastomosis   CASE DATA:   Type of patient?: Elective [facility] Private Case Status of Case? Elective Scheduled Infection Present At Time Of Surgery (PATOS)?  ABSCESS     Assessment  Rough start but stabilizing  [facility] Stay = 1 days)  Plan:  -Hopefully his nausea will wean away as anesthesia wears off.  He has Zofran as needed.  Problems with Compazine so Reglan is backup. -Hematochezia ot surprising at first - should taper off.  No significant  hemoglobin drop.  No hypotension.  Okay to continue chemical prophylaxis but follow hemoglobin. -Dysphagia 1/full liquid diet.  If tolerates advance to soft diet later today. -VTE prophylaxis- SCDs, etc -mobilize as tolerated to help recovery.  Fluid can prove his soreness get him up.  He is motivated.  Disposition:  Disposition:  The patient is from: Home  Anticipate discharge to:  Home  Anticipated Date of Discharge is:  January 13,2024    Barriers to discharge:  Pending Clinical improvement (more likely than not)  Patient currently is NOT MEDICALLY STABLE for discharge from the hospital from a surgery standpoint.      I reviewed nursing notes, last 24 h vitals and pain scores, last 48 h intake and output, last 24 h labs and trends, and last 24 h imaging results. I have reviewed this patient's available data, including medical history, events of note, test results, etc as part of my evaluation.  A significant portion of that time was spent in counseling.  Care during the described time interval was provided by me.  This care required moderate level of medical decision making.  11/11/2022    Subjective: (Chief complaint)  Nauseated last night.  Less so now.  Episode of bloody bowel movement concerning.  Not continued.  No episodes of hypotension.  Significant other in room.  Nurse just outside room.  Objective:  Vital signs:  Vitals:   11/10/22 2002 11/11/22 0107 11/11/22 0500 11/11/22 0611  BP: 112/70 108/65  110/71  Pulse: 90 94  67  Resp: '14 16  18  '$ Temp: 99.2 F (37.3 C) 99.1 F (37.3 C)  98.4 F (36.9  C)  TempSrc: Oral Oral  Oral  SpO2: 99% 99%  99%  Weight:   89.7 kg   Height:        Last BM Date : 11/10/22  Intake/Output   Yesterday:  01/10 0701 - 01/11 0700 In: 2567.8 [P.O.:300; I.V.:2217.8; IV Piggyback:50] Out: 1110 [Urine:1000; Blood:50] This shift:  No intake/output data recorded.  Bowel function:  Flatus: YES  BM:  YES  Drain: (No  drain)   Physical Exam:  General: Pt awake/alert in no acute distress Eyes: PERRL, normal EOM.  Sclera clear.  No icterus Neuro: CN II-XII intact w/o focal sensory/motor deficits. Lymph: No head/neck/groin lymphadenopathy Psych:  No delerium/psychosis/paranoia.  Oriented x 4 HENT: Normocephalic, Mucus membranes moist.  No thrush Neck: Supple, No tracheal deviation.  No obvious thyromegaly Chest: No pain to chest wall compression.  Good respiratory excursion.  No audible wheezing CV:  Pulses intact.  Regular rhythm.  No major extremity edema MS: Normal AROM mjr joints.  No obvious deformity  Abdomen: Soft.  Nondistended.  Mildly tender at incisions only.  No evidence of peritonitis.  No incarcerated hernias.  Ext:   No deformity.  No mjr edema.  No cyanosis Skin: No petechiae / purpurea.  No major sores.  Warm and dry    Results:   Cultures: No results found for this or any previous visit (from the past 720 hour(s)).  Labs: Results for orders placed or performed during the hospital encounter of 11/10/22 (from the past 48 hour(s))  ABO/Rh     Status: None   Collection Time: 11/10/22  6:30 AM  Result Value Ref Range   ABO/RH(D)      O POS Performed at [facility] [address], [place], [place] [address]   Basic metabolic panel     Status: Abnormal   Collection Time: 11/11/22  4:45 AM  Result Value Ref Range   Sodium 138 135 - 145 mmol/L   Potassium 3.5 3.5 - 5.1 mmol/L   Chloride 105 98 - 111 mmol/L   CO2 25 22 - 32 mmol/L   Glucose, Bld 167 (H) 70 - 99 mg/dL    Comment: Glucose reference range applies only to samples taken after fasting for at least 8 hours.   BUN 15 6 - 20 mg/dL   Creatinine, Ser 1.06 0.61 - 1.24 mg/dL   Calcium 8.6 (L) 8.9 - 10.3 mg/dL   GFR, Estimated >60 >60 mL/min    Comment: (NOTE) Calculated using the CKD-EPI Creatinine Equation (2021)    Anion gap 8 5 - 15    Comment: Performed at [facility], [place] [address], [place], [place] [address]  CBC     Status: Abnormal   Collection Time: 11/11/22  4:45 AM  Result Value Ref Range   WBC 13.5 (H) 4.0 - 10.5 K/uL   RBC 4.13 (L) 4.22 - 5.81 MIL/uL   Hemoglobin 11.9 (L) 13.0 - 17.0 g/dL   HCT 35.8 (L) 39.0 - 52.0 %   MCV 86.7 80.0 - 100.0 fL   MCH 28.8 26.0 - 34.0 pg   MCHC 33.2 30.0 - 36.0 g/dL   RDW 12.7 11.5 - 15.5 %   Platelets 283 150 - 400 K/uL   nRBC 0.0 0.0 - 0.2 %    Comment: Performed at [facility], [place] [address] [place], [place] [address]  Magnesium     Status: None   Collection Time: 11/11/22  4:45 AM  Result Value  Ref Range   Magnesium 2.0 1.7 - 2.4 mg/dL    Comment: Performed at [facility], [place] [address], [place], [place] [address]    Imaging / Studies: No results found.  Medications / Allergies: per chart  Antibiotics: Anti-infectives (From admission, onward)    Start     Dose/Rate Route Frequency Ordered Stop   11/10/22 1430  clindamycin (CLEOCIN) IVPB 900 mg        900 mg 100 mL/hr over 30 Minutes Intravenous Every 8 hours 11/10/22 1331 11/10/22 1658   11/10/22 1400  neomycin (MYCIFRADIN) tablet 1,000 mg  Status:  Discontinued       See Hyperspace for full Linked Orders Report.   1,000 mg Oral 3 times per day 11/10/22 0637 11/10/22 0643   11/10/22 1400  metroNIDAZOLE (FLAGYL) tablet 1,000 mg  Status:  Discontinued       See Hyperspace for full Linked Orders Report.   1,000 mg Oral 3 times per day 11/10/22 0637 11/10/22 0643   11/10/22 0645  ertapenem (INVANZ) 1,000 mg in sodium chloride 0.9 % 100 mL IVPB  Status:  Discontinued        1 g 200 mL/hr over 30 Minutes Intravenous On call to O.R. 11/10/22 1975 11/10/22 1328         Note: Portions of this report may have been transcribed using voice recognition software. Every effort was made to ensure accuracy; however, inadvertent computerized transcription errors may be present.   Any transcriptional errors that  result from this process are unintentional.    [name], MD, FACS, MASCRS Esophageal, Gastrointestinal & Colorectal Surgery Robotic and Minimally Invasive Surgery  [facility]. [address] [place], [place] [address] [phone] Fax [phone] Main  CONTACT INFORMATION:  Weekday (9AM-5PM): Call [facility] main office at [phone]  Weeknight (5PM-9AM) or Weekend/Holiday: Check [email or web address] (password " [email or web address]") for General Surgery [facility] coverage  (Please, do not use SecureChat as it is not reliable communication to reach operating surgeons for immediate patient care given surgeries/outpatient duties/clinic/cross-coverage/off post-call which would lead to a delay in care.  Epic staff messaging available for outptient concerns, but may not be answered for 48 hours or more).     11/11/2022  7:43 AM

## 2022-11-12 LAB — SURGICAL PATHOLOGY

## 2022-11-12 LAB — HEMOGLOBIN: Hemoglobin: 9.8 g/dL — ABNORMAL LOW (ref 13.0–17.0)

## 2022-11-12 LAB — POTASSIUM: Potassium: 3.8 mmol/L (ref 3.5–5.1)

## 2022-11-12 MED ORDER — POTASSIUM CHLORIDE CRYS ER 20 MEQ PO TBCR
40.0000 meq | EXTENDED_RELEASE_TABLET | Freq: Every day | ORAL | Status: AC
  Administered 2022-11-12 – 2022-11-13 (×2): 40 meq via ORAL
  Filled 2022-11-12 (×2): qty 2

## 2022-11-12 MED ORDER — LACTATED RINGERS IV BOLUS: 1000.0000 mL | Freq: Three times a day (TID) | INTRAVENOUS | Status: AC | PRN

## 2022-11-12 NOTE — Progress Notes (Deleted)
PIV removed from [place]. Not on [place]. Clean, dry, and intact.

## 2022-11-12 NOTE — Progress Notes (Signed)
  Transition of Care [facility]) Screening Note   Patient Details  Name: ROMOND PIPKINS Date of Birth: February 22, 1976   Transition of [facility]) CM/SW Contact:    Henrietta Dine, RN Phone Number: 11/12/2022, 12:23 PM    Transition of Care Department [facility]) has reviewed patient and no TOC needs have been identified at this time. We will continue to monitor patient advancement through interdisciplinary progression rounds. If new patient transition needs arise, please place a TOC consult.

## 2022-11-12 NOTE — Progress Notes (Signed)
Carlos Stone [id number] 1976/06/12  CARE TEAM:  PCP: [name], MD  Outpatient Care Team: Patient Care Team: [name], MD as PCP - General (Family Medicine) [name], MD as Consulting Physician (General Surgery) [name], MD as Consulting Physician (Gastroenterology)  Inpatient Treatment Team: Treatment Team: Attending Provider: [name], MD; Technician: Rutherford Nail, NT; Registered Nurse: Philomena Course, RN; Utilization Review: Lacretia Leigh, RN; Pharmacist: Emiliano Dyer, [facility]; Charge Nurse: Aura Dials, RN   Problem List:   Principal Problem:   Diverticulitis with abscess s/p sigmoid colectomy 11/10/2022 Active Problems:   Colonic diverticular abscess   Diverticulitis   2 Days Post-Op  11/10/2022  POST-OPERATIVE DIAGNOSIS:  SIGMOID DIVERTICULITIS WITH ABSCESS   PROCEDURE:   ROBOTIC LOW ANTERIOR RECTOSIGMOID RESECTION INTRAOPERATIVE ASSESSMENT OF PERFUSION USING FIREFLY TRANSVERSUS ABDOMINIS PLANE (TAP) BLOCK - BILATERAL RIGID PROCTOSCOPY   SURGEON:  [name], MD   OR FINDINGS:   Patient had inflamed mid distal sigmoid colon with abscess between sigmoid colon and dome of the bladder.  No strong evidence of fistula or cystitis.   No obvious metastatic disease on visceral parietal peritoneum or liver.   The anastomosis rests 12 cm from the anal verge by rigid proctoscopy.   It is a descending colon to proximal rectal 29 EEA stapled anastomosis   CASE DATA:   Type of patient?: Elective [facility] Private Case Status of Case? Elective Scheduled Infection Present At Time Of Surgery (PATOS)?  ABSCESS     Assessment  Stabilizing  [facility] Stay = 2 days)  Plan:  Soft diet.  Durene Fruits to heart as tolerated.  Follow-up in pathology.  Borderline hypokalemia.  Replace.  Fiber bowel regimen.  Start with soluble fiber.  Hopefully bowels will normalize.  SCDs ambulation and chemical VTE prophylaxis.  Hematochezia  waning already.  Hemoglobin not too low.  Follow.  Disposition:  Disposition:  The patient is from: Home  Anticipate discharge to:  Home  Anticipated Date of Discharge is:  January 13,2024    Barriers to discharge:  Pending Clinical improvement (more likely than not)  Patient currently is NOT MEDICALLY STABLE for discharge from the hospital from a surgery standpoint.      I reviewed nursing notes, last 24 h vitals and pain scores, last 48 h intake and output, last 24 h labs and trends, and last 24 h imaging results. I have reviewed this patient's available data, including medical history, events of note, test results, etc as part of my evaluation.  A significant portion of that time was spent in counseling.  Care during the described time interval was provided by me.  This care required moderate level of medical decision making.  11/12/2022    Subjective: (Chief complaint)  Hematochezia has tapered off.  A lot of loose small stools yesterday.  Some flatus.  Nausea much less.  Tolerating some thicker liquids.  Tylenol seems to be controlling most pain.  Significant other in room.  Nurse just outside room.  Objective:  Vital signs:  Vitals:   11/11/22 1726 11/11/22 2025 11/12/22 0500 11/12/22 0538  BP: 95/64 104/60  123/68  Pulse: 83 83  69  Resp: '16 18  17  '$ Temp: 98.8 F (37.1 C) 99 F (37.2 C)  98.4 F (36.9 C)  TempSrc: Oral Oral  Oral  SpO2: 98% 96%  99%  Weight:   98 kg   Height:        Last BM Date : 11/11/22  Intake/Output   Yesterday:  01/11 0701 - 01/12 0700 In: 740 [P.O.:740] Out: 300 [Urine:300] This shift:  No intake/output data recorded.  Bowel function:  Flatus: YES  BM:  YES  Drain: (No drain)   Physical Exam:  General: Pt awake/alert in no acute distress Eyes: PERRL, normal EOM.  Sclera clear.  No icterus Neuro: CN II-XII intact w/o focal sensory/motor deficits. Lymph: No head/neck/groin lymphadenopathy Psych:  No  delerium/psychosis/paranoia.  Oriented x 4 HENT: Normocephalic, Mucus membranes moist.  No thrush Neck: Supple, No tracheal deviation.  No obvious thyromegaly Chest: No pain to chest wall compression.  Good respiratory excursion.  No audible wheezing CV:  Pulses intact.  Regular rhythm.  No major extremity edema MS: Normal AROM mjr joints.  No obvious deformity  Abdomen: Soft.  Nondistended.  Mildly tender at incisions only.  No evidence of peritonitis.  No incarcerated hernias.  Ext:   No deformity.  No mjr edema.  No cyanosis Skin: No petechiae / purpurea.  No major sores.  Warm and dry    Results:   Cultures: No results found for this or any previous visit (from the past 720 hour(s)).  Labs: Results for orders placed or performed during the hospital encounter of 11/10/22 (from the past 48 hour(s))  Basic metabolic panel     Status: Abnormal   Collection Time: 11/11/22  4:45 AM  Result Value Ref Range   Sodium 138 135 - 145 mmol/L   Potassium 3.5 3.5 - 5.1 mmol/L   Chloride 105 98 - 111 mmol/L   CO2 25 22 - 32 mmol/L   Glucose, Bld 167 (H) 70 - 99 mg/dL    Comment: Glucose reference range applies only to samples taken after fasting for at least 8 hours.   BUN 15 6 - 20 mg/dL   Creatinine, Ser 1.06 0.61 - 1.24 mg/dL   Calcium 8.6 (L) 8.9 - 10.3 mg/dL   GFR, Estimated >60 >60 mL/min    Comment: (NOTE) Calculated using the CKD-EPI Creatinine Equation (2021)    Anion gap 8 5 - 15    Comment: Performed at [facility], [place] [address], [place], [place] [address]  CBC     Status: Abnormal   Collection Time: 11/11/22  4:45 AM  Result Value Ref Range   WBC 13.5 (H) 4.0 - 10.5 K/uL   RBC 4.13 (L) 4.22 - 5.81 MIL/uL   Hemoglobin 11.9 (L) 13.0 - 17.0 g/dL   HCT 35.8 (L) 39.0 - 52.0 %   MCV 86.7 80.0 - 100.0 fL   MCH 28.8 26.0 - 34.0 pg   MCHC 33.2 30.0 - 36.0 g/dL   RDW 12.7 11.5 - 15.5 %   Platelets 283 150 - 400 K/uL   nRBC 0.0 0.0 - 0.2 %    Comment:  Performed at [facility], [place] [address], [place], [place] [address]  Magnesium     Status: None   Collection Time: 11/11/22  4:45 AM  Result Value Ref Range   Magnesium 2.0 1.7 - 2.4 mg/dL    Comment: Performed at [facility], [place] [address] [place], [place] [address]  Hemoglobin     Status: Abnormal   Collection Time: 11/12/22 12:56 AM  Result Value Ref Range   Hemoglobin 9.8 (L) 13.0 - 17.0 g/dL    Comment: Performed at [facility], [place] [address] [place], [place] [address]  Potassium     Status: None   Collection  Time: 11/12/22 12:56 AM  Result Value Ref Range   Potassium 3.8 3.5 - 5.1 mmol/L    Comment: Performed at [facility], [place] [address], [place], [place] [address]    Imaging / Studies: No results found.  Medications / Allergies: per chart  Antibiotics: Anti-infectives (From admission, onward)    Start     Dose/Rate Route Frequency Ordered Stop   11/10/22 1430  clindamycin (CLEOCIN) IVPB 900 mg        900 mg 100 mL/hr over 30 Minutes Intravenous Every 8 hours 11/10/22 1331 11/10/22 1658   11/10/22 1400  neomycin (MYCIFRADIN) tablet 1,000 mg  Status:  Discontinued       See Hyperspace for full Linked Orders Report.   1,000 mg Oral 3 times per day 11/10/22 0637 11/10/22 0643   11/10/22 1400  metroNIDAZOLE (FLAGYL) tablet 1,000 mg  Status:  Discontinued       See Hyperspace for full Linked Orders Report.   1,000 mg Oral 3 times per day 11/10/22 0637 11/10/22 0643   11/10/22 0645  ertapenem (INVANZ) 1,000 mg in sodium chloride 0.9 % 100 mL IVPB  Status:  Discontinued        1 g 200 mL/hr over 30 Minutes Intravenous On call to O.R. 11/10/22 6045 11/10/22 1328         Note: Portions of this report may have been transcribed using voice recognition software. Every effort was made to ensure accuracy; however, inadvertent computerized transcription errors may be present.   Any  transcriptional errors that result from this process are unintentional.    [name], MD, FACS, MASCRS Esophageal, Gastrointestinal & Colorectal Surgery Robotic and Minimally Invasive Surgery  [facility]. [address], [place], [place] [address] [phone] Fax [phone] Main  CONTACT INFORMATION:  Weekday (9AM-5PM): Call [facility] main office at [phone]  Weeknight (5PM-9AM) or Weekend/Holiday: Check [email or web address] (password " [email or web address]") for General Surgery [facility] coverage  (Please, do not use SecureChat as it is not reliable communication to reach operating surgeons for immediate patient care given surgeries/outpatient duties/clinic/cross-coverage/off post-call which would lead to a delay in care.  Epic staff messaging available for outptient concerns, but may not be answered for 48 hours or more).     11/12/2022  7:24 AM

## 2022-11-13 ENCOUNTER — Inpatient Hospital Stay (HOSPITAL_COMMUNITY): Payer: No Typology Code available for payment source | Admitting: Certified Registered Nurse Anesthetist

## 2022-11-13 ENCOUNTER — Inpatient Hospital Stay (HOSPITAL_COMMUNITY): Payer: No Typology Code available for payment source

## 2022-11-13 ENCOUNTER — Encounter (HOSPITAL_COMMUNITY)
Admission: RE | Disposition: A | Discharge status: Home Health | Payer: Self-pay | Source: Home / Self Care | Attending: Surgery

## 2022-11-13 DIAGNOSIS — N321 Vesicointestinal fistula: Secondary | ICD-10-CM | POA: Diagnosis not present

## 2022-11-13 DIAGNOSIS — K5720 Diverticulitis of large intestine with perforation and abscess without bleeding: Secondary | ICD-10-CM | POA: Diagnosis not present

## 2022-11-13 HISTORY — PX: LAPAROSCOPY: SHX197

## 2022-11-13 HISTORY — PX: COLOSTOMY: SHX63

## 2022-11-13 LAB — CBC
HCT: 25.3 % — ABNORMAL LOW (ref 39.0–52.0)
Hemoglobin: 8.3 g/dL — ABNORMAL LOW (ref 13.0–17.0)
MCH: 29.5 pg (ref 26.0–34.0)
MCHC: 32.8 g/dL (ref 30.0–36.0)
MCV: 90 fL (ref 80.0–100.0)
Platelets: 225 10*3/uL (ref 150–400)
RBC: 2.81 MIL/uL — ABNORMAL LOW (ref 4.22–5.81)
RDW: 12.9 % (ref 11.5–15.5)
WBC: 15.3 10*3/uL — ABNORMAL HIGH (ref 4.0–10.5)
nRBC: 0 % (ref 0.0–0.2)

## 2022-11-13 LAB — POTASSIUM: Potassium: 4 mmol/L (ref 3.5–5.1)

## 2022-11-13 LAB — CREATININE, SERUM
Creatinine, Ser: 0.77 mg/dL (ref 0.61–1.24)
GFR, Estimated: >60 mL/min (ref >60)

## 2022-11-13 SURGERY — LAPAROSCOPY, DIAGNOSTIC
Anesthesia: General

## 2022-11-13 MED ORDER — METRONIDAZOLE 500 MG/100ML IV SOLN: 500.0000 mg | Freq: Four times a day (QID) | INTRAVENOUS | Status: DC

## 2022-11-13 MED ORDER — PHENYLEPHRINE HCL-NACL 20-0.9 MG/250ML-% IV SOLN
INTRAVENOUS | Status: DC | PRN
  Administered 2022-11-13: 25 ug/min via INTRAVENOUS

## 2022-11-13 MED ORDER — SODIUM CHLORIDE 0.9 % IV SOLN: INTRAVENOUS | Status: DC

## 2022-11-13 MED ORDER — MIDAZOLAM HCL 2 MG/2ML IJ SOLN
INTRAMUSCULAR | Status: DC | PRN
  Administered 2022-11-13: 2 mg via INTRAVENOUS

## 2022-11-13 MED ORDER — METHOCARBAMOL 1000 MG/10ML IJ SOLN
1000.0000 mg | Freq: Three times a day (TID) | INTRAVENOUS | Status: DC
  Administered 2022-11-13 – 2022-11-14 (×2): 1000 mg via INTRAVENOUS
  Filled 2022-11-13: qty 1000
  Filled 2022-11-13: qty 10

## 2022-11-13 MED ORDER — DEXAMETHASONE SODIUM PHOSPHATE 10 MG/ML IJ SOLN
INTRAMUSCULAR | Status: DC | PRN
  Administered 2022-11-13: 8 mg via INTRAVENOUS

## 2022-11-13 MED ORDER — HYDROMORPHONE HCL 1 MG/ML IJ SOLN
1.0000 mg | INTRAMUSCULAR | Status: DC | PRN
  Administered 2022-11-13: 1.5 mg via INTRAVENOUS
  Administered 2022-11-13: 2 mg via INTRAVENOUS
  Administered 2022-11-14 – 2022-11-15 (×6): 1 mg via INTRAVENOUS
  Administered 2022-11-15: 2 mg via INTRAVENOUS
  Administered 2022-11-15: 1 mg via INTRAVENOUS
  Administered 2022-11-15 (×3): 2 mg via INTRAVENOUS
  Administered 2022-11-16: 1 mg via INTRAVENOUS
  Administered 2022-11-16: 2 mg via INTRAVENOUS
  Administered 2022-11-19 – 2022-11-20 (×2): 1 mg via INTRAVENOUS
  Filled 2022-11-13 (×2): qty 2
  Filled 2022-11-13: qty 1
  Filled 2022-11-13: qty 2
  Filled 2022-11-13: qty 1
  Filled 2022-11-13: qty 2
  Filled 2022-11-13: qty 1
  Filled 2022-11-13: qty 2
  Filled 2022-11-13 (×2): qty 1
  Filled 2022-11-13: qty 2
  Filled 2022-11-13 (×6): qty 1
  Filled 2022-11-13: qty 2

## 2022-11-13 MED ORDER — ROCURONIUM BROMIDE 10 MG/ML (PF) SYRINGE
PREFILLED_SYRINGE | INTRAVENOUS | Status: DC | PRN
  Administered 2022-11-13: 40 mg via INTRAVENOUS
  Administered 2022-11-13: 60 mg via INTRAVENOUS

## 2022-11-13 MED ORDER — LACTATED RINGERS IV SOLN: INTRAVENOUS | Status: DC

## 2022-11-13 MED ORDER — MIDAZOLAM HCL 2 MG/2ML IJ SOLN
INTRAMUSCULAR | Status: AC
  Filled 2022-11-13: qty 2

## 2022-11-13 MED ORDER — PROPOFOL 10 MG/ML IV BOLUS
INTRAVENOUS | Status: DC | PRN
  Administered 2022-11-13: 170 mg via INTRAVENOUS

## 2022-11-13 MED ORDER — OXYCODONE HCL 5 MG PO TABS
5.0000 mg | ORAL_TABLET | ORAL | Status: DC | PRN
  Administered 2022-11-13: 10 mg via ORAL
  Filled 2022-11-13: qty 2

## 2022-11-13 MED ORDER — SODIUM CHLORIDE 0.9 % IV SOLN
2.0000 g | Freq: Three times a day (TID) | INTRAVENOUS | Status: DC
  Filled 2022-11-13 (×2): qty 12.5

## 2022-11-13 MED ORDER — LACTATED RINGERS IV SOLN: INTRAVENOUS | Status: DC | PRN

## 2022-11-13 MED ORDER — PROPOFOL 10 MG/ML IV BOLUS
INTRAVENOUS | Status: AC
  Filled 2022-11-13: qty 20

## 2022-11-13 MED ORDER — LIDOCAINE HCL (PF) 2 % IJ SOLN
INTRAMUSCULAR | Status: AC
  Filled 2022-11-13: qty 5

## 2022-11-13 MED ORDER — FENTANYL CITRATE PF 50 MCG/ML IJ SOSY: 25.0000 ug | PREFILLED_SYRINGE | INTRAMUSCULAR | Status: DC | PRN

## 2022-11-13 MED ORDER — ALBUMIN HUMAN 5 % IV SOLN: INTRAVENOUS | Status: DC | PRN

## 2022-11-13 MED ORDER — VASOPRESSIN 20 UNIT/ML IV SOLN
INTRAVENOUS | Status: DC | PRN
  Administered 2022-11-13: 2 [IU] via INTRAVENOUS

## 2022-11-13 MED ORDER — FENTANYL CITRATE (PF) 250 MCG/5ML IJ SOLN
INTRAMUSCULAR | Status: AC
  Filled 2022-11-13: qty 5

## 2022-11-13 MED ORDER — SUGAMMADEX SODIUM 200 MG/2ML IV SOLN
INTRAVENOUS | Status: DC | PRN
  Administered 2022-11-13: 200 mg via INTRAVENOUS

## 2022-11-13 MED ORDER — ROCURONIUM BROMIDE 10 MG/ML (PF) SYRINGE
PREFILLED_SYRINGE | INTRAVENOUS | Status: AC
  Filled 2022-11-13: qty 10

## 2022-11-13 MED ORDER — LORAZEPAM 0.5 MG PO TABS: 0.5000 mg | ORAL_TABLET | Freq: Three times a day (TID) | ORAL | Status: DC | PRN

## 2022-11-13 MED ORDER — PHENYLEPHRINE 80 MCG/ML (10ML) SYRINGE FOR IV PUSH (FOR BLOOD PRESSURE SUPPORT)
PREFILLED_SYRINGE | INTRAVENOUS | Status: DC | PRN
  Administered 2022-11-13 (×4): 160 ug via INTRAVENOUS

## 2022-11-13 MED ORDER — SUCCINYLCHOLINE CHLORIDE 200 MG/10ML IV SOSY
PREFILLED_SYRINGE | INTRAVENOUS | Status: AC
  Filled 2022-11-13: qty 10

## 2022-11-13 MED ORDER — METRONIDAZOLE 500 MG/100ML IV SOLN
500.0000 mg | Freq: Two times a day (BID) | INTRAVENOUS | Status: AC
  Administered 2022-11-13 – 2022-11-19 (×12): 500 mg via INTRAVENOUS
  Filled 2022-11-13 (×12): qty 100

## 2022-11-13 MED ORDER — IOHEXOL 350 MG/ML SOLN: 100.0000 mL | Freq: Once | INTRAVENOUS | Status: DC | PRN

## 2022-11-13 MED ORDER — ONDANSETRON HCL 4 MG/2ML IJ SOLN
INTRAMUSCULAR | Status: DC | PRN
  Administered 2022-11-13: 4 mg via INTRAVENOUS

## 2022-11-13 MED ORDER — AMISULPRIDE (ANTIEMETIC) 5 MG/2ML IV SOLN: 10.0000 mg | Freq: Once | INTRAVENOUS | Status: DC | PRN

## 2022-11-13 MED ORDER — METHOCARBAMOL 1000 MG/10ML IJ SOLN: 1000.0000 mg | Freq: Four times a day (QID) | INTRAVENOUS | Status: DC | PRN

## 2022-11-13 MED ORDER — SUCCINYLCHOLINE CHLORIDE 200 MG/10ML IV SOSY
PREFILLED_SYRINGE | INTRAVENOUS | Status: DC | PRN
  Administered 2022-11-13: 140 mg via INTRAVENOUS

## 2022-11-13 MED ORDER — IOHEXOL 300 MG/ML  SOLN
100.0000 mL | Freq: Once | INTRAMUSCULAR | Status: AC | PRN
  Administered 2022-11-13: 100 mL via INTRAVENOUS

## 2022-11-13 MED ORDER — LIDOCAINE HCL (CARDIAC) PF 100 MG/5ML IV SOSY
PREFILLED_SYRINGE | INTRAVENOUS | Status: DC | PRN
  Administered 2022-11-13: 60 mg via INTRAVENOUS

## 2022-11-13 MED ORDER — LACTATED RINGERS IV BOLUS
1000.0000 mL | Freq: Once | INTRAVENOUS | Status: AC
  Administered 2022-11-13: 1000 mL via INTRAVENOUS

## 2022-11-13 MED ORDER — SODIUM CHLORIDE 0.9 % IV SOLN
2.0000 g | Freq: Three times a day (TID) | INTRAVENOUS | Status: DC
  Administered 2022-11-13 – 2022-11-19 (×17): 2 g via INTRAVENOUS
  Filled 2022-11-13 (×19): qty 12.5

## 2022-11-13 MED ORDER — LACTATED RINGERS IR SOLN
Status: DC | PRN
  Administered 2022-11-13: 1000 mL

## 2022-11-13 MED ORDER — KETOROLAC TROMETHAMINE 15 MG/ML IJ SOLN
15.0000 mg | Freq: Four times a day (QID) | INTRAMUSCULAR | Status: DC | PRN
  Administered 2022-11-14 (×2): 30 mg via INTRAVENOUS
  Filled 2022-11-13 (×2): qty 2

## 2022-11-13 MED ORDER — FENTANYL CITRATE (PF) 250 MCG/5ML IJ SOLN
INTRAMUSCULAR | Status: DC | PRN
  Administered 2022-11-13: 50 ug via INTRAVENOUS
  Administered 2022-11-13 (×2): 100 ug via INTRAVENOUS

## 2022-11-13 MED ORDER — GABAPENTIN 300 MG PO CAPS
300.0000 mg | ORAL_CAPSULE | Freq: Three times a day (TID) | ORAL | Status: DC
  Administered 2022-11-13: 300 mg via ORAL
  Filled 2022-11-13 (×2): qty 3

## 2022-11-13 SURGICAL SUPPLY — 77 items
BAG COUNTER SPONGE SURGICOUNT (BAG) IMPLANT
BAG URINE DRAIN 2000ML AR STRL (UROLOGICAL SUPPLIES) IMPLANT
BLADE EXTENDED COATED 6.5IN (ELECTRODE) IMPLANT
BLADE HEX COATED 2.75 (ELECTRODE) IMPLANT
CABLE HIGH FREQUENCY MONO STRZ (ELECTRODE) ×1 IMPLANT
CHLORAPREP W/TINT 26 (MISCELLANEOUS) ×1 IMPLANT
COUNTER NEEDLE 1200 MAGNETIC (NEEDLE) ×1 IMPLANT
COVER MAYO STAND STRL (DRAPES) ×1 IMPLANT
COVER SURGICAL LIGHT HANDLE (MISCELLANEOUS) ×1 IMPLANT
DRAIN CHANNEL 19F RND (DRAIN) IMPLANT
DRAPE LAPAROSCOPIC ABDOMINAL (DRAPES) ×1 IMPLANT
DRAPE SHEET LG 3/4 BI-LAMINATE (DRAPES) ×1 IMPLANT
DRAPE UTILITY XL STRL (DRAPES) ×2 IMPLANT
DRAPE WARM FLUID 44X44 (DRAPES) ×1 IMPLANT
DRSG OPSITE POSTOP 4X10 (GAUZE/BANDAGES/DRESSINGS) IMPLANT
DRSG OPSITE POSTOP 4X6 (GAUZE/BANDAGES/DRESSINGS) IMPLANT
DRSG OPSITE POSTOP 4X8 (GAUZE/BANDAGES/DRESSINGS) IMPLANT
DRSG TEGADERM 2-3/8X2-3/4 SM (GAUZE/BANDAGES/DRESSINGS) IMPLANT
DRSG TEGADERM 4X4.75 (GAUZE/BANDAGES/DRESSINGS) IMPLANT
ELECT PENCIL ROCKER SW 15FT (MISCELLANEOUS) IMPLANT
ELECT REM PT RETURN 15FT ADLT (MISCELLANEOUS) ×1 IMPLANT
EVACUATOR SILICONE 100CC (DRAIN) IMPLANT
GAUZE SPONGE 2X2 STRL 8-PLY (GAUZE/BANDAGES/DRESSINGS) ×1 IMPLANT
GAUZE SPONGE 4X4 12PLY STRL (GAUZE/BANDAGES/DRESSINGS) ×1 IMPLANT
GLOVE ECLIPSE 8.0 STRL XLNG CF (GLOVE) ×2 IMPLANT
GLOVE INDICATOR 8.0 STRL GRN (GLOVE) ×2 IMPLANT
GOWN STRL REUS W/ TWL XL LVL3 (GOWN DISPOSABLE) ×4 IMPLANT
GOWN STRL REUS W/TWL XL LVL3 (GOWN DISPOSABLE) ×4
HANDLE SUCTION POOLE (INSTRUMENTS) ×1 IMPLANT
IRRIG SUCT STRYKERFLOW 2 WTIP (MISCELLANEOUS) ×1
IRRIGATION SUCT STRKRFLW 2 WTP (MISCELLANEOUS) ×1 IMPLANT
KIT BASIN OR (CUSTOM PROCEDURE TRAY) ×1 IMPLANT
KIT GASTRIC LAVAGE 34FR ADT (SET/KITS/TRAYS/PACK) IMPLANT
KIT TURNOVER KIT A (KITS) IMPLANT
LEGGING LITHOTOMY PAIR STRL (DRAPES) IMPLANT
NDL INSUFFLATION 14GA 150MM (NEEDLE) IMPLANT
NEEDLE INSUFFLATION 14GA 150MM (NEEDLE) ×1 IMPLANT
PACK GENERAL/GYN (CUSTOM PROCEDURE TRAY) ×1 IMPLANT
PAD POSITIONING PINK XL (MISCELLANEOUS) ×1 IMPLANT
PROTECTOR NERVE ULNAR (MISCELLANEOUS) IMPLANT
RETRACTOR WND ALEXIS 25 LRG (MISCELLANEOUS) IMPLANT
RTRCTR WOUND ALEXIS 18CM SML (INSTRUMENTS) ×1
RTRCTR WOUND ALEXIS 25CM LRG (MISCELLANEOUS)
SAVER CELL AAL HAEMONETICS (INSTRUMENTS) IMPLANT
SCISSORS LAP 5X35 DISP (ENDOMECHANICALS) ×1 IMPLANT
SEALER TISSUE G2 STRG ARTC 35C (ENDOMECHANICALS) IMPLANT
SET TUBE SMOKE EVAC HIGH FLOW (TUBING) ×1 IMPLANT
SLEEVE GASTRECTOMY 36FR VISIGI (MISCELLANEOUS) IMPLANT
SLEEVE Z-THREAD 5X100MM (TROCAR) ×2 IMPLANT
SPIKE FLUID TRANSFER (MISCELLANEOUS) ×1 IMPLANT
SPONGE GAUZE 2X2 8PLY STRL LF (GAUZE/BANDAGES/DRESSINGS) IMPLANT
STAPLER VISISTAT 35W (STAPLE) ×1 IMPLANT
SUCTION POOLE HANDLE (INSTRUMENTS) ×1
SUT MNCRL AB 4-0 PS2 18 (SUTURE) ×1 IMPLANT
SUT PDS AB 1 TP1 96 (SUTURE) IMPLANT
SUT PROLENE 2 0 SH DA (SUTURE) IMPLANT
SUT SILK 0 (SUTURE)
SUT SILK 0 30XBRD TIE 6 (SUTURE) IMPLANT
SUT SILK 2 0 (SUTURE) ×1
SUT SILK 2 0 SH CR/8 (SUTURE) ×1 IMPLANT
SUT SILK 2-0 18XBRD TIE 12 (SUTURE) ×1 IMPLANT
SUT SILK 3 0 (SUTURE) ×1
SUT SILK 3 0 SH CR/8 (SUTURE) ×1 IMPLANT
SUT SILK 3-0 18XBRD TIE 12 (SUTURE) ×1 IMPLANT
SUT VIC AB 2-0 SH 18 (SUTURE) IMPLANT
SUT VIC AB 3-0 SH 8-18 (SUTURE) IMPLANT
SUT VICRYL 0 UR6 27IN ABS (SUTURE) IMPLANT
TAPE UMBILICAL 1/8 X36 TWILL (MISCELLANEOUS) ×1 IMPLANT
TOWEL OR 17X26 10 PK STRL BLUE (TOWEL DISPOSABLE) ×2 IMPLANT
TOWEL OR NON WOVEN STRL DISP B (DISPOSABLE) ×2 IMPLANT
TRAY FOLEY MTR SLVR 16FR STAT (SET/KITS/TRAYS/PACK) ×1 IMPLANT
TRAY IRRIG W/60CC SYR STRL (SET/KITS/TRAYS/PACK) IMPLANT
TRAY LAPAROSCOPIC (CUSTOM PROCEDURE TRAY) ×1 IMPLANT
TROCAR 11X100 Z THREAD (TROCAR) IMPLANT
TROCAR ADV FIXATION 5X100MM (TROCAR) IMPLANT
TROCAR Z-THREAD OPTICAL 5X100M (TROCAR) ×1 IMPLANT
YANKAUER SUCT BULB TIP 10FT TU (MISCELLANEOUS) IMPLANT

## 2022-11-13 NOTE — Progress Notes (Signed)
Ring gave to wife.

## 2022-11-13 NOTE — Interval H&P Note (Signed)
History and Physical Interval Note:  11/13/2022 8:37 PM  Carlos Stone  has presented today for surgery, with the diagnosis of perforation at sigmoid colon anastamoses.  The various methods of treatment have been discussed with the patient and family. After consideration of risks, benefits and other options for treatment, the patient has consented to  Procedure(s): LAPAROSCOPY DIAGNOSTIC (N/A) POSSIBLE  LAPAROTOMY (N/A) POSSIBLE COLOSTOMY (N/A) as a surgical intervention.  See scanned chest few dots of gas around the anastomosis but no major dehiscence.  Moderate pneumoperitoneum over the right liver.  Intraoperatively patient had good blood supply with no ischemia grossly nor by ICG.  No tension.  That he would get a delayed leak.  However he is declining so agree with being more aggressive.  Will start with laparoscopic possible open approach.  [place].  Identify source.  Most likely will place draining around anastomosis to do an protective loop ileostomy.  If there is evidence of significant major ischemia then he will need a more formal Hartmann resection and colostomy.  We will see.  Discussion with the patient and wife at bedside.  The patient's history has been reviewed, patient examined, no change in status, stable for surgery.  I have reviewed the patient's chart and labs.  Questions were answered to the patient's satisfaction.    The anatomy & physiology of the digestive tract was discussed.  The pathophysiology of perforation was discussed.  Differential diagnosis such as perforated ulcer or colon, etc was discussed.   Natural history risks without surgery such as death was discussed.  I recommended abdominal exploration to diagnose & treat the source of the problem.  Laparoscopic & open techniques were discussed.   Risks such as bleeding, infection, abscess, leak, reoperation, bowel resection, possible ostomy, injury to other organs, need for repair of tissues / organs, hernia, heart  attack, death, and other risks were discussed.   The risks of no intervention will lead to serious problems including death.   I expressed a good likelihood that surgery will address the problem.    Goals of post-operative recovery were discussed as well.  We will work to minimize complications although risks in an emergent setting are high.   Questions were answered.  The patient expressed understanding & wishes to proceed with surgery.        I have re-reviewed the the patient's records, history, medications, and allergies.  I have re-examined the patient.  I again discussed intraoperative plans and goals of post-operative recovery.  The patient agrees to proceed.  Carlos Stone  03-05-1976 [id number]  Patient Care Team: [name], MD as PCP - General (Family Medicine) [name], MD as Consulting Physician (General Surgery) [name], MD as Consulting Physician (Gastroenterology)  Patient Active Problem List   Diagnosis Date Noted   Diverticulitis 11/10/2022   Colonic diverticular abscess 07/07/2022   Diverticulitis with abscess s/p sigmoid colectomy 11/10/2022 07/07/2022   Elevated transaminase level 09/24/2013   Hypokalemia 09/22/2013   Pleuritic chest pain 09/22/2013   Community acquired pneumonia 09/21/2013    Past Medical History:  Diagnosis Date   Anxiety disorder    Diverticulosis    with abcess 09/06/22   Meningitis    age 68    Past Surgical History:  Procedure Laterality Date   APPENDECTOMY  2005   IR RADIOLOGIST EVAL & MGMT  07/23/2022   KNEE ARTHROSCOPY Right 2005   PROCTOSCOPY N/A 11/10/2022   Procedure: RIGID PROCTOSCOPY;  Surgeon: [name], MD;  Location: [facility];  Service: General;  Laterality: N/A;    Social History   Socioeconomic History   Marital status: Married    Spouse name: Not on file   Number of children: Not on file   Years of education: Not on file   Highest education level: Not on file  Occupational History   Not on file   Tobacco Use   Smoking status: Never   Smokeless tobacco: Never  Vaping Use   Vaping Use: Never used  Substance and Sexual Activity   Alcohol use: Yes    Alcohol/week: 1.0 standard drink of alcohol    Types: 1 Glasses of wine per week    Comment: occasional   Drug use: No   Sexual activity: Yes  Other Topics Concern   Not on file  Social History Narrative   Not on file   Social Determinants of Health   Financial Resource Strain: Not on file  Food Insecurity: No Food Insecurity (11/10/2022)   Hunger Vital Sign    Worried About Running Out of Food in the Last Year: Never true    Ran Out of Food in the Last Year: Never true  Transportation Needs: No Transportation Needs (11/10/2022)   PRAPARE - Hydrologist (Medical): No    Lack of Transportation (Non-Medical): No  Physical Activity: Not on file  Stress: Not on file  Social Connections: Not on file  Intimate Partner Violence: Not At Risk (11/10/2022)   Humiliation, Afraid, Rape, and Kick questionnaire    Fear of Current or Ex-Partner: No    Emotionally Abused: No    Physically Abused: No    Sexually Abused: No    History reviewed. No pertinent family history.  Medications Prior to Admission  Medication Sig Dispense Refill Last Dose   escitalopram (LEXAPRO) 20 MG tablet Take 20 mg by mouth daily.   11/09/2022   acetaminophen (TYLENOL) 325 MG tablet Take 2 tablets (650 mg total) by mouth every 6 (six) hours as needed for mild pain (or Fever >/= 101). (Patient not taking: Reported on 10/29/2022)   Not Taking   [DISCONTINUED] oxyCODONE (OXY IR/ROXICODONE) 5 MG immediate release tablet Take 1 tablet (5 mg total) by mouth every 6 (six) hours as needed for moderate pain. (Patient not taking: Reported on 10/29/2022) 15 tablet 0 Not Taking    Current Facility-Administered Medications  Medication Dose Route Frequency Provider Last Rate Last Admin   [MAR Hold] 0.9 %  sodium chloride infusion  250 mL  Intravenous PRN [name], MD   Stopped at 11/12/22 1108   0.9 %  sodium chloride infusion   Intravenous Continuous [name], MD 100 mL/hr at 11/13/22 1949 Rate Change at 11/13/22 1949   [MAR Hold] alum & mag hydroxide-simeth (MAALOX/MYLANTA) 200-200-20 MG/5ML suspension 30 mL  30 mL Oral Q6H PRN [name], MD       [facility] Hold] ceFEPIme (MAXIPIME) 2 g in sodium chloride 0.9 % 100 mL IVPB  2 g Intravenous Delsa Bern, MD 200 mL/hr at 11/13/22 1951 2 g at 11/13/22 1951   [MAR Hold] diphenhydrAMINE (BENADRYL) 12.5 MG/5ML elixir 12.5 mg  12.5 mg Oral Q6H PRN [name], MD       Or   [facility] Hold] diphenhydrAMINE (BENADRYL) injection 12.5 mg  12.5 mg Intravenous Q6H PRN [name], MD       [facility] Hold] enoxaparin (LOVENOX) injection 40 mg  40 mg Subcutaneous Q24H [name], MD   40 mg at  11/13/22 0757   [MAR Hold] feeding supplement (ENSURE SURGERY) liquid 237 mL  237 mL Oral BID BM [name], MD   237 mL at 11/13/22 1101   [MAR Hold] gabapentin (NEURONTIN) capsule 300 mg  300 mg Oral TID [name], MD   300 mg at 11/13/22 0956   [MAR Hold] hydrALAZINE (APRESOLINE) injection 10 mg  10 mg Intravenous Q2H PRN [name], MD       [facility] Hold] HYDROmorphone (DILAUDID) injection 1-2 mg  1-2 mg Intravenous Q2H PRN [name], MD   1.5 mg at 11/13/22 1831   [MAR Hold] lactated ringers bolus 1,000 mL  1,000 mL Intravenous Q8H PRN [name], MD       [facility] Hold] lip balm (CARMEX) ointment   Topical BID [name], MD   Given at 11/13/22 0805   [MAR Hold] LORazepam (ATIVAN) tablet 0.5-1 mg  0.5-1 mg Oral Q8H PRN [name], MD       [facility] Hold] magic mouthwash  15 mL Oral QID PRN [name], MD       [facility] Hold] methocarbamol (ROBAXIN) 1,000 mg in dextrose 5 % 100 mL IVPB  1,000 mg Intravenous Tor Netters, MD 220 mL/hr at 11/13/22 1101 1,000 mg at 11/13/22 1101   [MAR Hold] metoCLOPramide (REGLAN) injection 5-10 mg  5-10 mg Intravenous Q8H PRN [name], MD    10 mg at 11/11/22 0838   [MAR Hold] metoprolol tartrate (LOPRESSOR) injection 5 mg  5 mg Intravenous Q6H PRN [name], MD       [facility] Hold] metroNIDAZOLE (FLAGYL) IVPB 500 mg  500 mg Intravenous Jolyn Nap, MD 100 mL/hr at 11/13/22 1832 Restarted at 11/13/22 1832   [MAR Hold] multivitamins with iron tablet 1 tablet  1 tablet Oral Daily [name], MD   1 tablet at 11/13/22 0759   [MAR Hold] ondansetron (ZOFRAN) tablet 4 mg  4 mg Oral Q6H PRN [name], MD   4 mg at 11/12/22 1525   Or   [MAR Hold] ondansetron (ZOFRAN) injection 4 mg  4 mg Intravenous Q6H PRN [name], MD   4 mg at 11/11/22 0355   [MAR Hold] polycarbophil (FIBERCON) tablet 625 mg  625 mg Oral BID [name], MD   625 mg at 11/13/22 0803   [MAR Hold] simethicone (MYLICON) chewable tablet 40 mg  40 mg Oral Q6H PRN [name], MD   40 mg at 11/13/22 0759   [MAR Hold] sodium chloride flush (NS) 0.9 % injection 3 mL  3 mL Intravenous Gorden Harms, MD   3 mL at 11/13/22 0829   [MAR Hold] sodium chloride flush (NS) 0.9 % injection 3 mL  3 mL Intravenous PRN [name], MD         Allergies  Allergen Reactions   Compazine [Prochlorperazine Edisylate] Other (See Comments)    Hallucination    Amoxil [Amoxicillin] Rash   Cipro [Ciprofloxacin Hcl] Rash   Penicillins Rash    BP 129/87 (BP Location: Left Arm)   Pulse (!) 119   Temp 100 F (37.8 C)   Resp (!) 25   Ht '5\' 9"'$  (1.753 m)   Wt 98 kg   SpO2 94%   BMI 31.91 kg/m   Labs: Results for orders placed or performed during the hospital encounter of 11/10/22 (from the past 48 hour(s))  Hemoglobin     Status: Abnormal   Collection Time: 11/12/22 12:56 AM  Result Value Ref Range   Hemoglobin 9.8 (  L) 13.0 - 17.0 g/dL    Comment: Performed at [facility], [place] [address] [place], [place] [address]  Potassium     Status: None   Collection Time: 11/12/22 12:56 AM  Result Value Ref Range   Potassium 3.8 3.5 - 5.1 mmol/L     Comment: Performed at [facility], [place] [address] [place], [place] [address]  CBC     Status: Abnormal   Collection Time: 11/13/22 10:20 AM  Result Value Ref Range   WBC 15.3 (H) 4.0 - 10.5 K/uL   RBC 2.81 (L) 4.22 - 5.81 MIL/uL   Hemoglobin 8.3 (L) 13.0 - 17.0 g/dL   HCT 25.3 (L) 39.0 - 52.0 %   MCV 90.0 80.0 - 100.0 fL   MCH 29.5 26.0 - 34.0 pg   MCHC 32.8 30.0 - 36.0 g/dL   RDW 12.9 11.5 - 15.5 %   Platelets 225 150 - 400 K/uL   nRBC 0.0 0.0 - 0.2 %    Comment: Performed at [facility], [place] [address], [place], [place] [address]  Creatinine, serum     Status: None   Collection Time: 11/13/22 10:20 AM  Result Value Ref Range   Creatinine, Ser 0.77 0.61 - 1.24 mg/dL   GFR, Estimated >60 >60 mL/min    Comment: (NOTE) Calculated using the CKD-EPI Creatinine Equation (2021) Performed at [facility], [place] [address] [place], [place] [address]   Potassium     Status: None   Collection Time: 11/13/22 10:20 AM  Result Value Ref Range   Potassium 4.0 3.5 - 5.1 mmol/L    Comment: Performed at [facility], [place] [address] [place], [place] [address]    Imaging / Studies: CT ABDOMEN PELVIS W CONTRAST  Result Date: 11/13/2022 CLINICAL DATA:  Abdominal pain, postop day 3 after partial colon resection and reanastomosis, history of colovesical fistula EXAM: CT ABDOMEN AND PELVIS WITH CONTRAST TECHNIQUE: Multidetector CT imaging of the abdomen and pelvis was performed using the standard protocol following bolus administration of intravenous contrast. RADIATION DOSE REDUCTION: This exam was performed according to the departmental dose-optimization program which includes automated exposure control, adjustment of the mA and/or kV according to patient size and/or use of iterative reconstruction technique. CONTRAST:  120m OMNIPAQUE IOHEXOL 300 MG/ML  SOLN COMPARISON:  07/23/2022 FINDINGS: Lower chest: Hypoventilatory  changes are seen at the lung bases. Trace bilateral pleural effusions, right greater than left. Hepatobiliary: No focal liver abnormality is seen. No gallstones, gallbladder wall thickening, or biliary dilatation. Pancreas: Unremarkable. No pancreatic ductal dilatation or surrounding inflammatory changes. Spleen: Normal in size without focal abnormality. Adrenals/Urinary Tract: The kidneys enhance normally and symmetrically. No urinary tract calculi or obstructive uropathy. The adrenals are unremarkable. The bladder is moderately distended without filling defect. Punctate foci of intraluminal gas are nonspecific and could reflect recent catheterization. Stomach/Bowel: Postsurgical changes from partial sigmoid colon resection and reanastomosis. The amount of intra-abdominal gas and fluid is more than expected on postoperative day 3, and anastomotic leak cannot be excluded. Further evaluation with CT with rectal contrast, or contrast enema, may be useful. No evidence of high-grade bowel obstruction. Mild distension of the proximal small bowel with scattered gas fluid levels may reflect postoperative ileus. Vascular/Lymphatic: No significant vascular findings are present. No enlarged abdominal or pelvic lymph nodes. Reproductive: Prostate is unremarkable. Other: There is significant pneumoperitoneum and free fluid throughout the abdomen and pelvis, more than would be expected for postoperative day  3 after abdominal surgery. Prominent gas and fluid adjacent to the anastomotic staple line in the upper pelvis suggests underlying anastomotic leak or dehiscence. No loculated fluid collection or drainable abscess at this time. No abdominal wall hernia. Musculoskeletal: No acute or destructive bony lesions. Reconstructed images demonstrate no additional findings. IMPRESSION: 1. Interval postoperative changes from sigmoid colon resection and reanastomosis. There is fluid and gas surrounding the anastomotic site, with  significant pneumoperitoneum and free intra-abdominal fluid more than would be expected for postoperative day 3 after surgery. Findings are concerning for anastomotic leak or dehiscence. If further evaluation is desired, CT after rectal contrast or contrast enema under fluoroscopy could be considered. 2. Mild diffuse distension of the small bowel with scattered gas fluid levels, most consistent with postoperative ileus. 3. Punctate foci of gas within the bladder lumen, nonspecific. Please correlate with any recent catheterization. 4. Bibasilar hypoventilatory changes and trace bilateral pleural effusions. Critical Value/emergent results were called by telephone at the time of interpretation on 11/13/2022 at 6:50 pm to provider ERIC WILSON , who verbally acknowledged these results. Electronically Signed   By: Randa Ngo M.D.   On: 11/13/2022 18:54     .[name], M.D., F.A.C.S. Gastrointestinal and Minimally Invasive Surgery [facility] [address] [place], [place] [address] [phone] Main / Paging  11/13/2022 8:37 PM    [name]

## 2022-11-13 NOTE — H&P (View-Only) (Signed)
Patient spiked low-grade temperature and became tachycardic so earlier today and back down his diet status, we started IV fluids and started IV antibiotics while awaiting the CT scan.  Radiology called with his CT scan results concerning for more pneumoperitoneum than expected on postop day 3 along with more intra-abdominal fluid than expected along with some gas and fluid around the anastomotic site  I came in to evaluate the patient.  Significant other at bedside  Patient had just received 1.5 mg of Dilaudid prior to my arrival  BP 123/87 (BP Location: Left Arm)   Pulse (!) 121   Temp 100.2 F (37.9 C) (Oral)   Resp 18   Ht '5\' 9"'$  (1.753 m)   Wt 98 kg   SpO2 93%   BMI 31.91 kg/m   Patient remains tachycardic with low-grade temperature A little ill-appearing Distended, not terribly soft but not firm, no peritonitis, some mild guarding  Given the amount of pneumoperitoneum, leukocytosis, tachycardia and low-grade fever I am concerned for an anastomotic leak  Discussed the case with Dr. Johney Maine who reviewed imaging  Patient was made n.p.o. but has not really drank that much today to begin with.  Discussed NG tube placement, Foley catheter placement bowel rest and IV antibiotics and seeing how he does overnight versus return to the OR this evening.  Discussed this option with patient and significant other as well  While he does not have peritonitis I am concerned about the overall clinical picture as described above  Discussed pros and cons of initial nonoperative approach this evening versus return to the OR this evening.  Advantage of going this evening with the earlier source control  Discussed surgical plan would be diagnostic laparoscopy, laparoscopic washout, placement of drains, diverting loop ileostomy.  If anastomosis is nonviable then we discussed he would need laparotomy with end colostomy.  Discussed definitive surgical management depends on intra-abdominal findings.   Discussed risk of the procedure including not limited to bleeding, injury to surrounding structures, blood clot formation, ileus, abscess, need to convert to an open procedure, need for additional procedures, getting sicker before getting better, prolonged hospitalization, ileostomy issues such as blood flow, high-volume/dehydration skin issues, hernia; colostomy issues such as blood flow and parastomal hernia.  Discussed that he may need ICU level care after surgery.  Discussed the possibility of remaining on the ventilator mainly after surgery if he was to go into septic shock.  Discussed possibility of needing a blood transfusion.  All questions asked and answered  Carlos Stone. Carlos Pulling, MD, FACS General, Bariatric, & Minimally Invasive Surgery [facility],  [place]

## 2022-11-13 NOTE — Progress Notes (Signed)
Carlos Stone [id number] 13-Feb-1976  CARE TEAM:  PCP: [name], MD  Outpatient Care Team: Patient Care Team: [name], MD as PCP - General (Family Medicine) [name], MD as Consulting Physician (General Surgery) [name], MD as Consulting Physician (Gastroenterology)  Inpatient Treatment Team: Treatment Team: Attending Provider: [name], MD; Technician: Rutherford Nail, NT; Registered Nurse: Philomena Course, RN; Utilization Review: Lacretia Leigh, RN; Pharmacist: Emiliano Dyer, [facility]; Charge Nurse: Aura Dials, RN   Problem List:   Principal Problem:   Diverticulitis with abscess s/p sigmoid colectomy 11/10/2022 Active Problems:   Colonic diverticular abscess   Diverticulitis   2 Days Post-Op  11/10/2022  POST-OPERATIVE DIAGNOSIS:  SIGMOID DIVERTICULITIS WITH ABSCESS   PROCEDURE:   ROBOTIC LOW ANTERIOR RECTOSIGMOID RESECTION INTRAOPERATIVE ASSESSMENT OF PERFUSION USING FIREFLY TRANSVERSUS ABDOMINIS PLANE (TAP) BLOCK - BILATERAL RIGID PROCTOSCOPY   SURGEON:  [name], MD   OR FINDINGS:   Patient had inflamed mid distal sigmoid colon with abscess between sigmoid colon and dome of the bladder.  No strong evidence of fistula or cystitis.   No obvious metastatic disease on visceral parietal peritoneum or liver.   The anastomosis rests 12 cm from the anal verge by rigid proctoscopy.   It is a descending colon to proximal rectal 29 EEA stapled anastomosis   CASE DATA:   Type of patient?: Elective [facility] Private Case Status of Case? Elective Scheduled Infection Present At Time Of Surgery (PATOS)?  ABSCESS     Assessment  [facility] Stay = 2 days)  Plan:  Feels worse this morning.  Suspect he is getting bloated with persistent ileus.  Decreased to soft diet only.  Go down to liquids if he feels much worse.  More aggressive pain control.  Continue standing Tylenol.  Add gabapentin and Robaxin.  Short and overall  of IV narcotics for now.  Continue ice/heat.  Anxiolysis.  Pain seems to be rather diffuse and not initially focal.  Hopefully with some lorazepam that should help out.  Continue Lexapro.  Recheck labs today.  If he spiking fevers or elevated white count, concern for delayed abscess or leak.  Seems unlikely and otherwise healthy person.  We will see.  Follow-up in pathology.  Borderline hypokalemia.  Replacing - follow  Fiber bowel regimen.  For now just to mild soluble fiber.  Hejl simethicone for bloating.  Hopefully bowels will normalize.  SCDs ambulation and chemical VTE prophylaxis.  Hematochezia waning already.  Hemoglobin not too low.  Follow.  Disposition:  Disposition:  The patient is from: Home  Anticipate discharge to:  Home  Anticipated Date of Discharge is:  January 15,2024    Barriers to discharge:  Pending Clinical improvement (more likely than not)  Patient currently is NOT MEDICALLY STABLE for discharge from the hospital from a surgery standpoint.      I reviewed nursing notes, last 24 h vitals and pain scores, last 48 h intake and output, last 24 h labs and trends, and last 24 h imaging results. I have reviewed this patient's available data, including medical history, events of note, test results, etc as part of my evaluation.  A significant portion of that time was spent in counseling.  Care during the described time interval was provided by me.  This care required moderate level of medical decision making.  11/12/2022    Subjective: (Chief complaint)  Patient with more sharp crampy pain this morning.  Had eaten some solid food but  appetite not normal.  No nausea or vomiting.  Walking little bit more.  Feels bloated.  Objective:  Vital signs:  Vitals:   11/11/22 1726 11/11/22 2025 11/12/22 0500 11/12/22 0538  BP: 95/64 104/60  123/68  Pulse: 83 83  69  Resp: '16 18  17  '$ Temp: 98.8 F (37.1 C) 99 F (37.2 C)  98.4 F (36.9 C)  TempSrc: Oral  Oral  Oral  SpO2: 98% 96%  99%  Weight:   98 kg   Height:        Last BM Date : 11/11/22  Intake/Output   Yesterday:  01/11 0701 - 01/12 0700 In: 740 [P.O.:740] Out: 300 [Urine:300] This shift:  No intake/output data recorded.  Bowel function:  Flatus:  ??  BM:  YES  Drain: (No drain)   Physical Exam:  General: Pt awake/alert in mild acute distress Eyes: PERRL, normal EOM.  Sclera clear.  No icterus Neuro: CN II-XII intact w/o focal sensory/motor deficits. Lymph: No head/neck/groin lymphadenopathy Psych:  No delerium/psychosis/paranoia.  Oriented x 4.  Mildly anxious and depressed but consolable. HENT: Normocephalic, Mucus membranes moist.  No thrush Neck: Supple, No tracheal deviation.  No obvious thyromegaly Chest: No pain to chest wall compression.  Good respiratory excursion.  No audible wheezing CV:  Pulses intact.  Regular rhythm.  No major extremity edema MS: Normal AROM mjr joints.  No obvious deformity  Abdomen: Somewhat firm.  Moderately distended.  Mildly tender at incisions only.  No evidence of peritonitis.  No guarding or hesitancy.  No incarcerated hernias.  Ext:   No deformity.  No mjr edema.  No cyanosis Skin: No petechiae / purpurea.  No major sores.  Warm and dry    Results:   Cultures: No results found for this or any previous visit (from the past 720 hour(s)).  Labs: Results for orders placed or performed during the hospital encounter of 11/10/22 (from the past 48 hour(s))  Basic metabolic panel     Status: Abnormal   Collection Time: 11/11/22  4:45 AM  Result Value Ref Range   Sodium 138 135 - 145 mmol/L   Potassium 3.5 3.5 - 5.1 mmol/L   Chloride 105 98 - 111 mmol/L   CO2 25 22 - 32 mmol/L   Glucose, Bld 167 (H) 70 - 99 mg/dL    Comment: Glucose reference range applies only to samples taken after fasting for at least 8 hours.   BUN 15 6 - 20 mg/dL   Creatinine, Ser 1.06 0.61 - 1.24 mg/dL   Calcium 8.6 (L) 8.9 - 10.3 mg/dL   GFR,  Estimated >60 >60 mL/min    Comment: (NOTE) Calculated using the CKD-EPI Creatinine Equation (2021)    Anion gap 8 5 - 15    Comment: Performed at [facility], [place] [address], [place], [place] [address]  CBC     Status: Abnormal   Collection Time: 11/11/22  4:45 AM  Result Value Ref Range   WBC 13.5 (H) 4.0 - 10.5 K/uL   RBC 4.13 (L) 4.22 - 5.81 MIL/uL   Hemoglobin 11.9 (L) 13.0 - 17.0 g/dL   HCT 35.8 (L) 39.0 - 52.0 %   MCV 86.7 80.0 - 100.0 fL   MCH 28.8 26.0 - 34.0 pg   MCHC 33.2 30.0 - 36.0 g/dL   RDW 12.7 11.5 - 15.5 %   Platelets 283 150 - 400 K/uL   nRBC 0.0 0.0 - 0.2 %    Comment: Performed  at [facility], [place] [address], [place], [place] [address]  Magnesium     Status: None   Collection Time: 11/11/22  4:45 AM  Result Value Ref Range   Magnesium 2.0 1.7 - 2.4 mg/dL    Comment: Performed at [facility], [place] [address], [place], [place] [address]  Hemoglobin     Status: Abnormal   Collection Time: 11/12/22 12:56 AM  Result Value Ref Range   Hemoglobin 9.8 (L) 13.0 - 17.0 g/dL    Comment: Performed at [facility], [place] [address] [place], [place] [address]  Potassium     Status: None   Collection Time: 11/12/22 12:56 AM  Result Value Ref Range   Potassium 3.8 3.5 - 5.1 mmol/L    Comment: Performed at [facility], [place] [address] [place], [place] [address]    Imaging / Studies: No results found.  Medications / Allergies: per chart  Antibiotics: Anti-infectives (From admission, onward)    Start     Dose/Rate Route Frequency Ordered Stop   11/10/22 1430  clindamycin (CLEOCIN) IVPB 900 mg        900 mg 100 mL/hr over 30 Minutes Intravenous Every 8 hours 11/10/22 1331 11/10/22 1658   11/10/22 1400  neomycin (MYCIFRADIN) tablet 1,000 mg  Status:  Discontinued       See Hyperspace for full Linked Orders Report.   1,000 mg Oral 3 times per day 11/10/22 0637 11/10/22  0643   11/10/22 1400  metroNIDAZOLE (FLAGYL) tablet 1,000 mg  Status:  Discontinued       See Hyperspace for full Linked Orders Report.   1,000 mg Oral 3 times per day 11/10/22 0637 11/10/22 0643   11/10/22 0645  ertapenem (INVANZ) 1,000 mg in sodium chloride 0.9 % 100 mL IVPB  Status:  Discontinued        1 g 200 mL/hr over 30 Minutes Intravenous On call to O.R. 11/10/22 4403 11/10/22 1328         Note: Portions of this report may have been transcribed using voice recognition software. Every effort was made to ensure accuracy; however, inadvertent computerized transcription errors may be present.   Any transcriptional errors that result from this process are unintentional.    [name], MD, FACS, MASCRS Esophageal, Gastrointestinal & Colorectal Surgery Robotic and Minimally Invasive Surgery  [facility]. [address], [place],  [address] [phone] Fax [phone] Main  CONTACT INFORMATION:  Weekday (9AM-5PM): Call [facility] main office at [phone]  Weeknight (5PM-9AM) or Weekend/Holiday: Check [email or web address] (password " [email or web address]") for General Surgery [facility] coverage  (Please, do not use SecureChat as it is not reliable communication to reach operating surgeons for immediate patient care given surgeries/outpatient duties/clinic/cross-coverage/off post-call which would lead to a delay in care.  Epic staff messaging available for outptient concerns, but may not be answered for 48 hours or more).     11/12/2022  7:24 AM

## 2022-11-13 NOTE — Progress Notes (Signed)
Patient spiked low-grade temperature and became tachycardic so earlier today and back down his diet status, we started IV fluids and started IV antibiotics while awaiting the CT scan.  Radiology called with his CT scan results concerning for more pneumoperitoneum than expected on postop day 3 along with more intra-abdominal fluid than expected along with some gas and fluid around the anastomotic site  I came in to evaluate the patient.  Significant other at bedside  Patient had just received 1.5 mg of Dilaudid prior to my arrival  BP 123/87 (BP Location: Left Arm)   Pulse (!) 121   Temp 100.2 F (37.9 C) (Oral)   Resp 18   Ht '5\' 9"'$  (1.753 m)   Wt 98 kg   SpO2 93%   BMI 31.91 kg/m   Patient remains tachycardic with low-grade temperature A little ill-appearing Distended, not terribly soft but not firm, no peritonitis, some mild guarding  Given the amount of pneumoperitoneum, leukocytosis, tachycardia and low-grade fever I am concerned for an anastomotic leak  Discussed the case with Dr. Johney Maine who reviewed imaging  Patient was made n.p.o. but has not really drank that much today to begin with.  Discussed NG tube placement, Foley catheter placement bowel rest and IV antibiotics and seeing how he does overnight versus return to the OR this evening.  Discussed this option with patient and significant other as well  While he does not have peritonitis I am concerned about the overall clinical picture as described above  Discussed pros and cons of initial nonoperative approach this evening versus return to the OR this evening.  Advantage of going this evening with the earlier source control  Discussed surgical plan would be diagnostic laparoscopy, laparoscopic washout, placement of drains, diverting loop ileostomy.  If anastomosis is nonviable then we discussed he would need laparotomy with end colostomy.  Discussed definitive surgical management depends on intra-abdominal findings.   Discussed risk of the procedure including not limited to bleeding, injury to surrounding structures, blood clot formation, ileus, abscess, need to convert to an open procedure, need for additional procedures, getting sicker before getting better, prolonged hospitalization, ileostomy issues such as blood flow, high-volume/dehydration skin issues, hernia; colostomy issues such as blood flow and parastomal hernia.  Discussed that he may need ICU level care after surgery.  Discussed the possibility of remaining on the ventilator mainly after surgery if he was to go into septic shock.  Discussed possibility of needing a blood transfusion.  All questions asked and answered  [name]. Redmond Pulling, MD, FACS General, Bariatric, & Minimally Invasive Surgery [facility],  [place]

## 2022-11-13 NOTE — Anesthesia Procedure Notes (Signed)
Procedure Name: Intubation Date/Time: 11/13/2022 9:29 PM  Performed by: Darral Dash, DOPre-anesthesia Checklist: Patient identified, Emergency Drugs available, Suction available and Patient being monitored Patient Re-evaluated:Patient Re-evaluated prior to induction Oxygen Delivery Method: Circle system utilized Preoxygenation: Pre-oxygenation with 100% oxygen Induction Type: IV induction Ventilation: Mask ventilation without difficulty Laryngoscope Size: Mac and 3 Grade View: Grade II Tube type: Oral Tube size: 8.0 mm Number of attempts: 1 Airway Equipment and Method: Stylet and Oral airway Placement Confirmation: ETT inserted through vocal cords under direct vision, positive ETCO2 and breath sounds checked- equal and bilateral Secured at: 23 cm Tube secured with: Tape Dental Injury: Teeth and Oropharynx as per pre-operative assessment

## 2022-11-13 NOTE — Op Note (Signed)
11/10/2022 - 11/13/2022  11:41 PM  PATIENT:  Carlos Stone  47 y.o. 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  PROCEDURE:   Diagnostic laparoscopy. Washout and drainage of intra-abdominal abscesses x 2. Diverting loop ileostomy.  SURGEON:  [name], MD  ASSISTANT: [name], MD An experienced assistant was required given the standard of surgical care given the complexity of the case.  This assistant was needed for exposure, dissection, suction, tissue approximation, retraction, perception, etc.  ANESTHESIA:     General  EBL:  Total I/O In: -  Out: 350 [Urine:300; Blood:50]  Delay start of Pharmacological VTE agent (>24hrs) due to surgical blood loss or risk of bleeding:  no  DRAINS: 19 Fr drain with tip in the pelvis    SPECIMEN:  NONE  COUNTS:  YES  PLAN OF CARE: Admit to inpatient   PATIENT DISPOSITION:  PACU - guarded condition.  INDICATION:    Obese male with history of colovesical fistula with abscess stabilized with drainage and antibiotics.  Recommended surgical resection.  Underwent robotic rectosigmoid resection with takedown of colovesical fistula.  Small isolated abscess.  Patient with worsening abdominal pain and ileus on postop day 3.  CT scan with more gas than expected.  Tachycardic.  Recommendation made for laparoscopic possible open exploration to rule out perforated ulcer or delayed anastomotic  leak.   The anatomy & physiology of the digestive tract was discussed.  The pathophysiology of perforation was discussed.  Differential diagnosis such as perforated ulcer or colon, etc was discussed.   Natural history risks without surgery such as death was discussed.  I recommended abdominal exploration to diagnose & treat the source of the problem.  Laparoscopic & open techniques were discussed.   Risks such as bleeding, infection, abscess, leak, reoperation, bowel resection, possible ostomy, injury to other organs, need for repair of tissues / organs, hernia, heart attack, death, and other risks were discussed.   The risks of no intervention will lead to serious problems including death.   I expressed a good likelihood that surgery will address the problem.    Goals of post-operative recovery were discussed as well.  We will work to minimize complications although risks in an emergent setting are high.   Questions were answered.  The patient expressed understanding & wishes to proceed with surgery.      OR FINDINGS:   Patient had moderate peritonitis with evidence of some feculent peritonitis.  Interloop infraumbilical retroperitoneal abscess is walled off by small bowel that tract to the right anterior colorectal anastomosis - epicenter of inflammation and phlegmon suspicious for microperforation there.  No evidence of any major ischemia or necrosis of the colon nor rectum.  Inflammatory adhesions causing partial small bowel obstruction released.  No evidence of any small bowel perforation or injury nor gastroduodenal ulceration or perforation.  Extensive washout done.  Diverting loop ileostomy done.  Difficult nasogastric tube intubation ultimately successful with placing Ewalt and dilating to pass 18 French nasogastric tube to decompress stomach with fecalization  DESCRIPTION:   Informed consent was confirmed.  The patient underwent  general anaesthesia without difficulty.  The patient was  positioned appropriately.  Patient placed in lithotomy position.  Rectal tube 30 French Foley balloon placed by me with evacuation of a few 100 mL of liquid stool somewhat bloody.  Anesthesia had sterilely placed A-line and left radial artery.  VTE prevention in place.  The patient's abdomen was clipped, prepped, & draped in a sterile fashion.  Surgical timeout confirmed our plan.  Peritoneal entry with a laparoscopic port was obtained using Varess spring needle entry technique in the left upper abdomen as the patient was positioned in reverse Trendelenburg.  I induced carbon dioxide insufflation.  No change in end tidal CO2 measurements.  Full symmetrical abdominal distention.  Initial port was carefully placed.  Camera inspection revealed no injury.  Extra ports were carefully placed under direct laparoscopic visualization.  Upon entering the abdomen (organ space), I encountered feculent peritonitis.  Were seen to be in the lower abdomen.  Stomach massively dilated.  Attempts to pass nasogastric tube met resistance around 30 cm.  Left in the esophagus at the start.  Greater omentum retracted supraumbilically.  Numerous small bowel loops with dense adhesions down the pelvis to the anastomosis and freed off to encounter some feculent and nightly biliary staining.  This was washed out and aspirated.  Freed the greater omentum off the small bowel.  Freed off numerous small bowel loop adhesions.  Probable distal partial small bowel obstruction due to the adhesions to the pelvis anastomosis.  These were broken up.  Small bowel was run and able to find the ileocecal valve.  Random terminal ileum and had an ability for it to reach to the premarked right infraumbilical paramedian site.  We focused on copious irrigation in all quadrants.  Some old hematoma in the right upper quadrant and some omentum.  This was washed off.  Aspirated.  Washed off and freed off interloop adhesions on small bowel.  Meticulously  inspected to prove no injury there.  Dr. Redmond Pulling scrubbed out of the able to pass an Ewald tube and thoroughly down the esophagus to decompress the stomach.  Then switched to a slightly smaller lap band tube transorally to dilate the esophagus.  With that an 43 French nasogastric tube passed more easily into the stomach and decompressed it well.   That was secured to the nose.  Focus again down the pelvis with copious irrigation.  I can see the descending colon with some inflammation but it was pink and viable.  Came down anastomosis.  It looks like the right anterior corner of the stapled anastomosis had moderate phlegmon and irritation.  This seemed to be the source of a probable microperforation.  There is no diffuse dehiscence and no exposed mucosa consistent with a total anastomotic breach.  The rectum distal to that of the peritoneal reflection appeared viable as well.  Washed out some more.  About a total of 7 L of irrigation done.  Because there is no massive disruption or evidence of any ischemia necrosis or gangrene at the colorectal anastomosis, side to place drain nearby and proximally divert.  Chosen area in the terminal ileum about 30 cm proximal to ileocecal valve that could reach to the anterior abdominal wall held in place.  Did inspection 1 last time for no injury.  Try to get great omentum to come down but it would only come down somewhat infraumbilically.  I decided to hold off on trying to do an omentopedicle flap in the setting of peritonitis.  Passing 19 [place] Blake drain from a left port site in the posterior retroperitoneum along the right posterior pelvis with the tip going posterior to anastomosis coiling anteriorly.  Allowed small bowel to fall over the drain.  Not but greater omentum to come down although it would not go all the way down again.  Carbon oxide was evacuated.  I created an extraction incision through a 4 cm cylindrical defect in the right infraumbilical paramedian region  at premarked spite.  Opened the anterior rectus fascia transversely and the rectus muscle and posterior rectus fascia vertically.  Placed a small wound protector.  Brought up to the terminal ileum.  Looked viable.  We did diagnostic laparoscopy and confirmed that the proximal end was cephalad.  Was able to eviscerate a healthy knuckle of  ileum.    Carbon oxide evacuated.  Drain secured with Prolene suture.  I closed the 81m port sites using Monocryl stitch and sterile dressing.  I matured the loop ileostomy in a Brooke fashion using 2-0 Vicryl interrupted suture.  Proximal loop is more superior.  Finger intubated well without any twisting or torsion.  Colostomy appliance placed.  Patient's shock under better control.  Weaning off low-dose pressors.  Will plan to keep in stepdown unit given peritonitis.  Will keep on antibiotics 5 days.  Low threshold to do CAT scan in 5 days to rule out delayed abscess formation.  Patient is being extubated go to recovery room. I discussed operative findings, updated the patient's status, discussed probable steps to recovery, and gave postoperative recommendations to the patient's significant other, KAntionette Poles.  Recommendations were made.  Questions were answered.  She expressed understanding & appreciation.  SAdin Hector M.D., F.A.C.S. Gastrointestinal and Minimally Invasive Surgery [facility] [address] [place] [phone] / Paging

## 2022-11-13 NOTE — Progress Notes (Signed)
Pt's wbc increasing with decreasing hgb. Has worsening pain per nurse. No fever. No tachy.  Will check CT with contrast. Did have IA abscess drained during surgery. A little early for recurrent abscess.  Diffl includes leak, intra-abd hematoma, ileus.   Will back diet back down to FLD for now Restart some IVF Check CT a/p with Oral/iv contrast Repeat cbc in am and type/screen Will continue prophylactic lovenox for now. If hgb cont to decrease, will need to stop lovenox Will hold off on abx until ct results.   [name]. Redmond Pulling, MD, FACS General, Bariatric, & Minimally Invasive Surgery [facility],  [place]

## 2022-11-13 NOTE — Anesthesia Preprocedure Evaluation (Addendum)
Anesthesia Evaluation  Patient identified by MRN, date of birth, ID band Patient awake    Reviewed: Allergy & Precautions, Patient's Chart, lab work & pertinent test results  Airway Mallampati: II  TM Distance: >3 FB Neck ROM: Full    Dental no notable dental hx.    Pulmonary neg pulmonary ROS   Pulmonary exam normal        Cardiovascular negative cardio ROS  Rhythm:Regular Rate:Normal     Neuro/Psych   Anxiety     negative neurological ROS     GI/Hepatic Neg liver ROS,,,Diverticulosis with anastomotic leak s/p resection 11/10/21   Endo/Other  negative endocrine ROS    Renal/GU negative Renal ROS  negative genitourinary   Musculoskeletal negative musculoskeletal ROS (+)    Abdominal Normal abdominal exam  (+)   Peds  Hematology negative hematology ROS (+)   Anesthesia Other Findings   Reproductive/Obstetrics                             Anesthesia Physical Anesthesia Plan  ASA: 2 and emergent  Anesthesia Plan: General   Post-op Pain Management:    Induction: Intravenous and Rapid sequence  PONV Risk Score and Plan: 2 and Ondansetron, Dexamethasone, Midazolam and Treatment may vary due to age or medical condition  Airway Management Planned: Mask and Oral ETT  Additional Equipment: Arterial line  Intra-op Plan:   Post-operative Plan: Possible Post-op intubation/ventilation  Informed Consent: I have reviewed the patients History and Physical, chart, labs and discussed the procedure including the risks, benefits and alternatives for the proposed anesthesia with the patient or authorized representative who has indicated his/her understanding and acceptance.     Dental advisory given  Plan Discussed with: CRNA  Anesthesia Plan Comments: (Lab Results      Component                Value               Date                      WBC                      15.3 (H)             11/13/2022                HGB                      8.3 (L)             11/13/2022                HCT                      25.3 (L)            11/13/2022                MCV                      90.0                11/13/2022                PLT                      225  11/13/2022             Lab Results      Component                Value               Date                      NA                       138                 11/11/2022                K                        4.0                 11/13/2022                CO2                      25                  11/11/2022                GLUCOSE                  167 (H)             11/11/2022                BUN                      15                  11/11/2022                CREATININE               0.77                11/13/2022                CALCIUM                  8.6 (L)             11/11/2022                GFRNONAA                 >60                 11/13/2022           )       Anesthesia Quick Evaluation

## 2022-11-13 NOTE — Anesthesia Procedure Notes (Signed)
Arterial Line Insertion Start/End1/13/2024 9:45 PM, 11/13/2022 9:48 PM Performed by: Darral Dash, DO, anesthesiologist  Patient location: OR. Preanesthetic checklist: patient identified, IV checked, site marked, risks and benefits discussed, surgical consent, monitors and equipment checked, pre-op evaluation, timeout performed and anesthesia consent Lidocaine 1% used for infiltration and patient sedated Left, radial was placed Catheter size: 20 G Hand hygiene performed  and maximum sterile barriers used   Attempts: 1 Procedure performed without using ultrasound guided technique. Following insertion, dressing applied. Post procedure assessment: normal and unchanged  Post procedure complications: second provider assisted and unsuccessful attempts. Patient tolerated the procedure well with no immediate complications.

## 2022-11-14 LAB — POCT I-STAT 7, (LYTES, BLD GAS, ICA,H+H)
Acid-base deficit: 1 mmol/L (ref 0.0–2.0)
Bicarbonate: 25 mmol/L (ref 20.0–28.0)
Calcium, Ion: 1.26 mmol/L (ref 1.15–1.40)
HCT: 24 % — ABNORMAL LOW (ref 39.0–52.0)
Hemoglobin: 8.2 g/dL — ABNORMAL LOW (ref 13.0–17.0)
O2 Saturation: 97 %
Potassium: 4.9 mmol/L (ref 3.5–5.1)
Sodium: 136 mmol/L (ref 135–145)
TCO2: 26 mmol/L (ref 22–32)
pCO2 arterial: 46.9 mmHg (ref 32–48)
pH, Arterial: 7.335 — ABNORMAL LOW (ref 7.35–7.45)
pO2, Arterial: 94 mmHg (ref 83–108)

## 2022-11-14 LAB — CBC
HCT: 26.2 % — ABNORMAL LOW (ref 39.0–52.0)
Hemoglobin: 8.5 g/dL — ABNORMAL LOW (ref 13.0–17.0)
MCH: 29.2 pg (ref 26.0–34.0)
MCHC: 32.4 g/dL (ref 30.0–36.0)
MCV: 90 fL (ref 80.0–100.0)
Platelets: 248 10*3/uL (ref 150–400)
RBC: 2.91 MIL/uL — ABNORMAL LOW (ref 4.22–5.81)
RDW: 13.1 % (ref 11.5–15.5)
WBC: 17.3 10*3/uL — ABNORMAL HIGH (ref 4.0–10.5)
nRBC: 0 % (ref 0.0–0.2)

## 2022-11-14 LAB — BASIC METABOLIC PANEL
Anion gap: 6 (ref 5–15)
BUN: 19 mg/dL (ref 6–20)
CO2: 26 mmol/L (ref 22–32)
Calcium: 8.2 mg/dL — ABNORMAL LOW (ref 8.9–10.3)
Chloride: 103 mmol/L (ref 98–111)
Creatinine, Ser: 0.84 mg/dL (ref 0.61–1.24)
GFR, Estimated: >60 mL/min (ref >60)
Glucose, Bld: 168 mg/dL — ABNORMAL HIGH (ref 70–99)
Potassium: 4.2 mmol/L (ref 3.5–5.1)
Sodium: 135 mmol/L (ref 135–145)

## 2022-11-14 LAB — MAGNESIUM: Magnesium: 2 mg/dL (ref 1.7–2.4)

## 2022-11-14 LAB — MRSA NEXT GEN BY PCR, NASAL: MRSA by PCR Next Gen: NOT DETECTED

## 2022-11-14 MED ORDER — LORAZEPAM 2 MG/ML IJ SOLN
0.5000 mg | Freq: Three times a day (TID) | INTRAMUSCULAR | Status: DC | PRN
  Administered 2022-11-14: 0.5 mg via INTRAVENOUS
  Administered 2022-11-15 – 2022-11-21 (×10): 1 mg via INTRAVENOUS
  Filled 2022-11-14 (×12): qty 1

## 2022-11-14 MED ORDER — CHLORHEXIDINE GLUCONATE CLOTH 2 % EX PADS
6.0000 | MEDICATED_PAD | Freq: Every day | CUTANEOUS | Status: DC
  Administered 2022-11-14 – 2022-11-18 (×5): 6 via TOPICAL

## 2022-11-14 MED ORDER — LIP MEDEX EX OINT
TOPICAL_OINTMENT | CUTANEOUS | Status: AC
  Administered 2022-11-14: 75 via CUTANEOUS
  Filled 2022-11-14: qty 7

## 2022-11-14 MED ORDER — LACTATED RINGERS IR SOLN
Status: DC | PRN
  Administered 2022-11-13 (×4): 1000 mL

## 2022-11-14 MED ORDER — FENTANYL CITRATE PF 50 MCG/ML IJ SOSY
PREFILLED_SYRINGE | INTRAMUSCULAR | Status: AC
  Filled 2022-11-14: qty 1

## 2022-11-14 MED ORDER — MAGIC MOUTHWASH
10.0000 mL | Freq: Three times a day (TID) | ORAL | Status: DC
  Administered 2022-11-14 – 2022-11-16 (×6): 10 mL via ORAL
  Filled 2022-11-14 (×8): qty 10

## 2022-11-14 MED ORDER — ORAL CARE MOUTH RINSE: 15.0000 mL | OROMUCOSAL | Status: DC | PRN

## 2022-11-14 MED ORDER — METHOCARBAMOL 1000 MG/10ML IJ SOLN
1000.0000 mg | Freq: Three times a day (TID) | INTRAVENOUS | Status: AC
  Administered 2022-11-14 – 2022-11-15 (×3): 1000 mg via INTRAVENOUS
  Filled 2022-11-14: qty 1000
  Filled 2022-11-14: qty 10
  Filled 2022-11-14 (×2): qty 1000

## 2022-11-14 MED ORDER — METHOCARBAMOL 1000 MG/10ML IJ SOLN
1000.0000 mg | Freq: Three times a day (TID) | INTRAVENOUS | Status: DC
  Filled 2022-11-14: qty 10

## 2022-11-14 MED ORDER — LACTATED RINGERS IV BOLUS: 1000.0000 mL | Freq: Three times a day (TID) | INTRAVENOUS | Status: AC | PRN

## 2022-11-14 NOTE — Progress Notes (Signed)
Carlos Stone [id number] [birth year]    CARE TEAM:  PCP: [name], MD  Outpatient Care Team: Patient Care Team: [name], MD as PCP - General (Family Medicine) [name], MD as Consulting Physician (General Surgery) [name], MD as Consulting Physician (Gastroenterology)  Inpatient Treatment Team: Treatment Team: Attending Provider: [name], MD; Registered Nurse: Claud Kelp, RN; Utilization Review: Alease Medina, RN   Problem List:   Principal Problem:   Diverticulitis with abscess s/p sigmoid colectomy 11/10/2022 Active Problems:   Colonic diverticular abscess   Diverticulitis   1 Day Post-Op  11/10/2022  POST-OPERATIVE DIAGNOSIS:  SIGMOID DIVERTICULITIS WITH ABSCESS   PROCEDURE:   ROBOTIC LOW ANTERIOR RECTOSIGMOID RESECTION INTRAOPERATIVE ASSESSMENT OF PERFUSION USING FIREFLY TRANSVERSUS ABDOMINIS PLANE (TAP) BLOCK - BILATERAL RIGID PROCTOSCOPY   SURGEON:  [name], MD   OR FINDINGS:   Patient had inflamed mid distal sigmoid colon with abscess between sigmoid colon and dome of the bladder.  No strong evidence of fistula or cystitis.   No obvious metastatic disease on visceral parietal peritoneum or liver.   The anastomosis rests 12 cm from the anal verge by rigid proctoscopy.   It is a descending colon to proximal rectal 29 EEA stapled anastomosis   CASE DATA:   Type of patient?: Elective [facility] Private Case Status of Case? Elective Scheduled Infection Present At Time Of Surgery (PATOS)?  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   PROCEDURE:   Diagnostic laparoscopy. Washout and drainage of intra-abdominal abscesses x 2. Diverting loop ileostomy.   SURGEON:  [name], MD   ASSISTANT: [name],  MD  OR FINDINGS:    Patient had moderate peritonitis with evidence of some feculent peritonitis.  Interloop infraumbilical retroperitoneal abscess is walled off by small bowel that tract to the right anterior colorectal anastomosis - epicenter of inflammation and phlegmon suspicious for microperforation there.  No evidence of any major ischemia or necrosis of the colon nor rectum.  Inflammatory adhesions causing partial small bowel obstruction released.  No evidence of any small bowel perforation or injury nor gastroduodenal ulceration or perforation.  Extensive washout done.  Diverting loop ileostomy done.   Difficult nasogastric tube intubation ultimately successful with placing Ewall tube and dilating to pass 18 French nasogastric tube to decompress stomach with fecalization   Assessment  Guarded but stabilizing [facility] Stay = 4 days)  Plan:  NG tube until return of bowel function.  DC rectal tube.  DC Foley.  DC A-line.  Ileostomy care.  Keep drain for several weeks.  Most likely need follow-up and enema & drain contrast study in a few weeks to make sure that delayed leak has closed down.  Hartmann resection if worse.  Hopefully not likely.  IV antibiotics x 5 days postop minimal.  Doing cefepime and metronidazole given his allergies to numerous other medications.  Low threshold to repeat CAT scan 1/19 if not better to rule out delayed abscess formation.  Try and get up and mobilize.  Up in a chair.  Hopefully can transfer out of stepdown unit later today versus tomorrow.  I updated the patient's status to the patient and spouse  Recommendations were made.  Questions were answered.  They expressed understanding & appreciation.   Disposition: TBD       I reviewed nursing notes, last 24 h vitals and pain  scores, last 48 h intake and output, last 24 h labs and trends, and last 24 h imaging results. I have reviewed this patient's available data, including medical  history, events of note, test results, etc as part of my evaluation.  A significant portion of that time was spent in counseling.  Care during the described time interval was provided by me.  This care required moderate level of medical decision making.  11/14/2022    Subjective: (Chief complaint)  Required emergent operative exploration with diverting loop ileostomy last night.  Stepdown unit without any major issues.  NG tube somewhat irritating.  Abdominal pain already less.  Objective:  Vital signs:  Vitals:   11/14/22 0100 11/14/22 0115 11/14/22 0200 11/14/22 0300  BP: 113/80 117/83    Pulse: 94 98 99 (!) 102  Resp: 20 (!) '22 19 15  '$ Temp: 98.7 F (37.1 C)     TempSrc:      SpO2: 98% 95% 94% 95%  Weight:      Height:        Last BM Date : 11/12/22  Intake/Output   Yesterday:  01/13 0701 - 01/14 0700 In: 1640 [P.O.:280; I.V.:1000; IV Piggyback:360] Out: 1250 [Urine:1050; Drains:50; Blood:50] This shift:  No intake/output data recorded.  Bowel function:  Flatus: No  BM:  YES  Drain (19 Fr Blake drain in pelvis) : Serosanguinous   Physical Exam:  General: Pt awake/alert in mild acute distress Eyes: PERRL, normal EOM.  Sclera clear.  No icterus Neuro: CN II-XII intact w/o focal sensory/motor deficits. Lymph: No head/neck/groin lymphadenopathy Psych:  No delerium/psychosis/paranoia.  Oriented x 4.  Mildly anxious and depressed but consolable. HENT: Normocephalic, Mucus membranes moist.  No thrush Neck: Supple, No tracheal deviation.  No obvious thyromegaly Chest: No pain to chest wall compression.  Good respiratory excursion.  No audible wheezing CV:  Pulses intact.  Regular rhythm.  No major extremity edema MS: Normal AROM mjr joints.  No obvious deformity  Abdomen: Somewhat firm.  Moderately distended.  Mildly tender at incisions only.  Less peritonitis.  No incarcerated hernias. Ileostomy right infraumbilical paramedian with some edema.  Viable.  No  gas or stool in bag.  Incisions clean dry intact.    GU: Clear light yellow urine in bag. Rectal: 30 French Foley balloon as rectal tube with minimal output. Ext:   No deformity.  No mjr edema.  No cyanosis Skin: No petechiae / purpurea.  No major sores.  Warm and dry    Results:   Cultures: Recent Results (from the past 720 hour(s))  MRSA Next Gen by PCR, Nasal     Status: None   Collection Time: 11/14/22  1:30 AM   Specimen: Nasal Mucosa; Nasal Swab  Result Value Ref Range Status   MRSA by PCR Next Gen NOT DETECTED NOT DETECTED Final    Comment: (NOTE) The GeneXpert MRSA Assay (FDA approved for NASAL specimens only), is one component of a comprehensive MRSA colonization surveillance program. It is not intended to diagnose MRSA infection nor to guide or monitor treatment for MRSA infections. Test performance is not FDA approved in patients less than 60 years old. Performed at [facility], [place] [address], [place], [place] [address]     Labs: Results for orders placed or performed during the hospital encounter of 11/10/22 (from the past 48 hour(s))  CBC     Status: Abnormal   Collection Time: 11/13/22 10:20 AM  Result Value Ref Range   WBC 15.3 (  H) 4.0 - 10.5 K/uL   RBC 2.81 (L) 4.22 - 5.81 MIL/uL   Hemoglobin 8.3 (L) 13.0 - 17.0 g/dL   HCT 25.3 (L) 39.0 - 52.0 %   MCV 90.0 80.0 - 100.0 fL   MCH 29.5 26.0 - 34.0 pg   MCHC 32.8 30.0 - 36.0 g/dL   RDW 12.9 11.5 - 15.5 %   Platelets 225 150 - 400 K/uL   nRBC 0.0 0.0 - 0.2 %    Comment: Performed at [facility] [place] [address] [place], [place] [address]  Creatinine, serum     Status: None   Collection Time: 11/13/22 10:20 AM  Result Value Ref Range   Creatinine, Ser 0.77 0.61 - 1.24 mg/dL   GFR, Estimated >60 >60 mL/min    Comment: (NOTE) Calculated using the CKD-EPI Creatinine Equation (2021) Performed at [facility], [place] [address], [place], [place] [address]    Potassium     Status: None   Collection Time: 11/13/22 10:20 AM  Result Value Ref Range   Potassium 4.0 3.5 - 5.1 mmol/L    Comment: Performed at [facility], [place] [address], [place], [place] [address]  I-STAT 7, (LYTES, BLD GAS, ICA, H+H)     Status: Abnormal   Collection Time: 11/13/22  9:55 PM  Result Value Ref Range   pH, Arterial 7.335 (L) 7.35 - 7.45   pCO2 arterial 46.9 32 - 48 mmHg   pO2, Arterial 94 83 - 108 mmHg   Bicarbonate 25.0 20.0 - 28.0 mmol/L   TCO2 26 22 - 32 mmol/L   O2 Saturation 97 %   Acid-base deficit 1.0 0.0 - 2.0 mmol/L   Sodium 136 135 - 145 mmol/L   Potassium 4.9 3.5 - 5.1 mmol/L   Calcium, Ion 1.26 1.15 - 1.40 mmol/L   HCT 24.0 (L) 39.0 - 52.0 %   Hemoglobin 8.2 (L) 13.0 - 17.0 g/dL   Sample type ARTERIAL   MRSA Next Gen by PCR, Nasal     Status: None   Collection Time: 11/14/22  1:30 AM   Specimen: Nasal Mucosa; Nasal Swab  Result Value Ref Range   MRSA by PCR Next Gen NOT DETECTED NOT DETECTED    Comment: (NOTE) The GeneXpert MRSA Assay (FDA approved for NASAL specimens only), is one component of a comprehensive MRSA colonization surveillance program. It is not intended to diagnose MRSA infection nor to guide or monitor treatment for MRSA infections. Test performance is not FDA approved in patients less than 23 years old. Performed at [facility], [place] [address], [place], [place] [address]   Magnesium     Status: None   Collection Time: 11/14/22  2:42 AM  Result Value Ref Range   Magnesium 2.0 1.7 - 2.4 mg/dL    Comment: Performed at [facility], [place] [address] [place], [place] [address]  CBC     Status: Abnormal   Collection Time: 11/14/22  2:42 AM  Result Value Ref Range   WBC 17.3 (H) 4.0 - 10.5 K/uL   RBC 2.91 (L) 4.22 - 5.81 MIL/uL   Hemoglobin 8.5 (L) 13.0 - 17.0 g/dL   HCT 26.2 (L) 39.0 - 52.0 %   MCV 90.0 80.0 - 100.0 fL   MCH 29.2 26.0 - 34.0 pg   MCHC 32.4 30.0 - 36.0 g/dL   RDW  13.1 11.5 - 15.5 %   Platelets 248 150 - 400 K/uL   nRBC  0.0 0.0 - 0.2 %    Comment: Performed at [facility], [place] [address] [place], [place] [address]  Basic metabolic panel     Status: Abnormal   Collection Time: 11/14/22  2:42 AM  Result Value Ref Range   Sodium 135 135 - 145 mmol/L   Potassium 4.2 3.5 - 5.1 mmol/L   Chloride 103 98 - 111 mmol/L   CO2 26 22 - 32 mmol/L   Glucose, Bld 168 (H) 70 - 99 mg/dL    Comment: Glucose reference range applies only to samples taken after fasting for at least 8 hours.   BUN 19 6 - 20 mg/dL   Creatinine, Ser 0.84 0.61 - 1.24 mg/dL   Calcium 8.2 (L) 8.9 - 10.3 mg/dL   GFR, Estimated >60 >60 mL/min    Comment: (NOTE) Calculated using the CKD-EPI Creatinine Equation (2021)    Anion gap 6 5 - 15    Comment: Performed at [facility], [place] [address] [place], [place] [address]  Type and screen [place]     Status: None   Collection Time: 11/14/22  2:58 AM  Result Value Ref Range   ABO/RH(D) O POS    Antibody Screen NEG    Sample Expiration      11/17/2022,2359 Performed at [facility], [place] [address] [place], [place] [address]     Imaging / Studies: CT ABDOMEN PELVIS W CONTRAST  Result Date: 11/13/2022 CLINICAL DATA:  Abdominal pain, postop day 3 after partial colon resection and reanastomosis, history of colovesical fistula EXAM: CT ABDOMEN AND PELVIS WITH CONTRAST TECHNIQUE: Multidetector CT imaging of the abdomen and pelvis was performed using the standard protocol following bolus administration of intravenous contrast. RADIATION DOSE REDUCTION: This exam was performed according to the departmental dose-optimization program which includes automated exposure control, adjustment of the mA and/or kV according to patient size and/or use of iterative reconstruction technique. CONTRAST:  156m OMNIPAQUE IOHEXOL 300 MG/ML  SOLN COMPARISON:  07/23/2022 FINDINGS: Lower  chest: Hypoventilatory changes are seen at the lung bases. Trace bilateral pleural effusions, right greater than left. Hepatobiliary: No focal liver abnormality is seen. No gallstones, gallbladder wall thickening, or biliary dilatation. Pancreas: Unremarkable. No pancreatic ductal dilatation or surrounding inflammatory changes. Spleen: Normal in size without focal abnormality. Adrenals/Urinary Tract: The kidneys enhance normally and symmetrically. No urinary tract calculi or obstructive uropathy. The adrenals are unremarkable. The bladder is moderately distended without filling defect. Punctate foci of intraluminal gas are nonspecific and could reflect recent catheterization. Stomach/Bowel: Postsurgical changes from partial sigmoid colon resection and reanastomosis. The amount of intra-abdominal gas and fluid is more than expected on postoperative day 3, and anastomotic leak cannot be excluded. Further evaluation with CT with rectal contrast, or contrast enema, may be useful. No evidence of high-grade bowel obstruction. Mild distension of the proximal small bowel with scattered gas fluid levels may reflect postoperative ileus. Vascular/Lymphatic: No significant vascular findings are present. No enlarged abdominal or pelvic lymph nodes. Reproductive: Prostate is unremarkable. Other: There is significant pneumoperitoneum and free fluid throughout the abdomen and pelvis, more than would be expected for postoperative day 3 after abdominal surgery. Prominent gas and fluid adjacent to the anastomotic staple line in the upper pelvis suggests underlying anastomotic leak or dehiscence. No loculated fluid collection or drainable abscess at this time. No abdominal wall hernia. Musculoskeletal: No acute or destructive bony lesions. Reconstructed images demonstrate no additional findings. IMPRESSION: 1. Interval postoperative changes from  sigmoid colon resection and reanastomosis. There is fluid and gas surrounding the  anastomotic site, with significant pneumoperitoneum and free intra-abdominal fluid more than would be expected for postoperative day 3 after surgery. Findings are concerning for anastomotic leak or dehiscence. If further evaluation is desired, CT after rectal contrast or contrast enema under fluoroscopy could be considered. 2. Mild diffuse distension of the small bowel with scattered gas fluid levels, most consistent with postoperative ileus. 3. Punctate foci of gas within the bladder lumen, nonspecific. Please correlate with any recent catheterization. 4. Bibasilar hypoventilatory changes and trace bilateral pleural effusions. Critical Value/emergent results were called by telephone at the time of interpretation on 11/13/2022 at 6:50 pm to provider ERIC WILSON , who verbally acknowledged these results. Electronically Signed   By: Randa Ngo M.D.   On: 11/13/2022 18:54    Medications / Allergies: per chart  Antibiotics: Anti-infectives (From admission, onward)    Start     Dose/Rate Route Frequency Ordered Stop   11/13/22 2000  ceFEPIme (MAXIPIME) 2 g in sodium chloride 0.9 % 100 mL IVPB  Status:  Discontinued       Note to Pharmacy: Pharmacy may adjust dosing strength, schedule, rate of infusion, etc as needed to optimize therapy   2 g 200 mL/hr over 30 Minutes Intravenous Every 8 hours 11/13/22 1913 11/13/22 1925   11/13/22 2000  metroNIDAZOLE (FLAGYL) IVPB 500 mg  Status:  Discontinued        500 mg 100 mL/hr over 60 Minutes Intravenous Every 6 hours 11/13/22 1913 11/13/22 1924   11/13/22 1800  ceFEPIme (MAXIPIME) 2 g in sodium chloride 0.9 % 100 mL IVPB        2 g 200 mL/hr over 30 Minutes Intravenous Every 8 hours 11/13/22 1619     11/13/22 1800  metroNIDAZOLE (FLAGYL) IVPB 500 mg        500 mg 100 mL/hr over 60 Minutes Intravenous Every 12 hours 11/13/22 1619     11/10/22 1430  clindamycin (CLEOCIN) IVPB 900 mg        900 mg 100 mL/hr over 30 Minutes Intravenous Every 8 hours 11/10/22  1331 11/10/22 1658   11/10/22 1400  neomycin (MYCIFRADIN) tablet 1,000 mg  Status:  Discontinued       See Hyperspace for full Linked Orders Report.   1,000 mg Oral 3 times per day 11/10/22 0637 11/10/22 0643   11/10/22 1400  metroNIDAZOLE (FLAGYL) tablet 1,000 mg  Status:  Discontinued       See Hyperspace for full Linked Orders Report.   1,000 mg Oral 3 times per day 11/10/22 0637 11/10/22 0643   11/10/22 0645  ertapenem (INVANZ) 1,000 mg in sodium chloride 0.9 % 100 mL IVPB  Status:  Discontinued        1 g 200 mL/hr over 30 Minutes Intravenous On call to O.R. 11/10/22 8676 11/10/22 1328         Note: Portions of this report may have been transcribed using voice recognition software. Every effort was made to ensure accuracy; however, inadvertent computerized transcription errors may be present.   Any transcriptional errors that result from this process are unintentional.    [name], MD, FACS, MASCRS Esophageal, Gastrointestinal & Colorectal Surgery Robotic and Minimally Invasive Surgery  [facility] [place]. [address] [place], [place] [address] [phone] Fax [phone] Main  CONTACT INFORMATION:  Weekday (9AM-5PM): Call [facility] main office at [phone]  Weeknight (  5PM-9AM) or Weekend/Holiday: Check [email or web address] (password " [email or web address]") for General Surgery [facility] coverage  (Please, do not use SecureChat as it is not reliable communication to reach operating surgeons for immediate patient care given surgeries/outpatient duties/clinic/cross-coverage/off post-call which would lead to a delay in care.  Epic staff messaging available for outptient concerns, but may not be answered for 48 hours or more).     11/14/2022  7:01 AM

## 2022-11-14 NOTE — Progress Notes (Signed)
Path benign - diverticulitis I told the pt the good news

## 2022-11-14 NOTE — Anesthesia Postprocedure Evaluation (Signed)
Anesthesia Post Note  Patient: Carlos Stone  Procedure(s) Performed: LAPAROSCOPY DIAGNOSTIC WASHOURT AND DRAINAGE OF INTRAABDOMINAL ABSCESSES X 2, DIVERTING LOOP ILEOSTOMY COLOSTOMY     Patient location during evaluation: PACU Anesthesia Type: General Level of consciousness: awake and alert Pain management: pain level controlled Vital Signs Assessment: post-procedure vital signs reviewed and stable Respiratory status: spontaneous breathing, nonlabored ventilation, respiratory function stable and patient connected to nasal cannula oxygen Cardiovascular status: blood pressure returned to baseline and stable Postop Assessment: no apparent nausea or vomiting Anesthetic complications: no   No notable events documented.  Last Vitals:  Vitals:   11/14/22 1215 11/14/22 1250  BP:    Pulse: (!) 104   Resp: 20   Temp:  37.1 C  SpO2: 92%     Last Pain:  Vitals:   11/14/22 1250  TempSrc: Oral  PainSc:                  March Rummage Ellan Tess

## 2022-11-14 NOTE — Consult Note (Signed)
[facility] Nurse ostomy consult note Consult received for new ostomy.  [place] Nurse will visit on Monday, 11/15/22.  [facility] nursing team will follow, and will remain available to this patient, the nursing, surgical and medical teams.    Thank you for inviting [place] to participate in this patient's Plan of Care.  Maudie Flakes, MSN, RN, CNS, [place], Serita Grammes, [employer], [employer] phone:  [phone]

## 2022-11-14 NOTE — Transfer of Care (Signed)
Immediate Anesthesia Transfer of Care Note  Patient: Carlos Stone  Procedure(s) Performed: LAPAROSCOPY DIAGNOSTIC REPAIR OF COLOSTOMY  Patient Location: PACU  Anesthesia Type:General  Level of Consciousness: awake, drowsy, and patient cooperative  Airway & Oxygen Therapy: Patient Spontanous Breathing and Patient connected to face mask oxygen  Post-op Assessment: Report given to RN and Post -op Vital signs reviewed and stable  Post vital signs: Reviewed and stable  Last Vitals:  Vitals Value Taken Time  BP 117/73 11/14/22 0000  Temp    Pulse 95 11/14/22 0000  Resp 17 11/14/22 0000  SpO2 99 % 11/14/22 0000  Vitals shown include unvalidated device data.  Last Pain:  Vitals:   11/13/22 1959  TempSrc: Oral  PainSc:       Patients Stated Pain Goal: 1 (80/32/12 2482)  Complications: No notable events documented.

## 2022-11-15 ENCOUNTER — Encounter (HOSPITAL_COMMUNITY): Payer: Self-pay | Admitting: Surgery

## 2022-11-15 LAB — CREATININE, SERUM
Creatinine, Ser: 0.81 mg/dL (ref 0.61–1.24)
GFR, Estimated: >60 mL/min (ref >60)

## 2022-11-15 LAB — POTASSIUM: Potassium: 3.8 mmol/L (ref 3.5–5.1)

## 2022-11-15 LAB — CBC
HCT: 20.6 % — ABNORMAL LOW (ref 39.0–52.0)
Hemoglobin: 6.7 g/dL — CL (ref 13.0–17.0)
MCH: 29.5 pg (ref 26.0–34.0)
MCHC: 32.5 g/dL (ref 30.0–36.0)
MCV: 90.7 fL (ref 80.0–100.0)
Platelets: 254 10*3/uL (ref 150–400)
RBC: 2.27 MIL/uL — ABNORMAL LOW (ref 4.22–5.81)
RDW: 13.2 % (ref 11.5–15.5)
WBC: 14.2 10*3/uL — ABNORMAL HIGH (ref 4.0–10.5)
nRBC: 0 % (ref 0.0–0.2)

## 2022-11-15 LAB — HEMOGLOBIN AND HEMATOCRIT, BLOOD
HCT: 26.1 % — ABNORMAL LOW (ref 39.0–52.0)
Hemoglobin: 8.3 g/dL — ABNORMAL LOW (ref 13.0–17.0)

## 2022-11-15 LAB — PREPARE RBC (CROSSMATCH)

## 2022-11-15 MED ORDER — SODIUM CHLORIDE 0.9% IV SOLUTION: Freq: Once | INTRAVENOUS | Status: AC

## 2022-11-15 MED ORDER — ENOXAPARIN SODIUM 40 MG/0.4ML IJ SOSY
40.0000 mg | PREFILLED_SYRINGE | INTRAMUSCULAR | Status: DC
  Administered 2022-11-19 – 2022-11-21 (×4): 40 mg via SUBCUTANEOUS
  Filled 2022-11-15 (×6): qty 0.4

## 2022-11-15 NOTE — Progress Notes (Signed)
  Transition of Care [facility]) Screening Note   Patient Details  Name: Carlos Stone Date of Birth: 08-15-1976   Transition of Care [facility]) CM/SW Contact:    Roseanne Kaufman, RN Phone Number: 11/15/2022, 5:37 PM  New ileostomy will follow for discharge needs.  Transition of Care Department [facility]) has reviewed patient and no TOC needs have been identified at this time. We will continue to monitor patient advancement through interdisciplinary progression rounds. If new patient transition needs arise, please place a TOC consult.

## 2022-11-15 NOTE — Consult Note (Signed)
[facility] Nurse ostomy consult note Stoma type/location: RLQ loop ileostomy Stomal assessment/size: 1 and 5/8 inch round, slightly raised, red, edematous Peristomal assessment: intact Treatment options for stomal/peristomal skin: skin barrier ring Output: 84ms of green effluent in pouch Ostomy pouching: 2pc. 2 and 1/4 inch pouching system with skin barrier ring.  Pouch is LKellie Simmering# [id number], Skin barrier is LKellie Simmering# [id number], skin barrier ring is LKellie Simmering# [phone] provided:  Wife at bedside, observes first several minutes of pouch change before becoming lightheaded and needing to sit down. Patient not able to participate. Is listening, but receiving blood and with NGT and with eyes closed. Explained role of ostomy nurse and creation of stoma  Explained stoma characteristics (budded, flush, color, texture, care) Demonstrated pouch change (cutting new skin barrier, measuring stoma, cleaning peristomal skin and stoma, use of barrier ring)   [place]   Supplies in room:  1 2-piece pouching set up with skin barrier rings. 3 1-piece convex ostomy pouching systems, stoma measuring guide, pattern. Educational booklet left in room for when ileostomy teaching resumes.  [place] will follow, and will remain available to this patient, the nursing, surgical and medical teams.    Thank you for inviting [place] participate in this patient's Plan of Care.  LMaudie Flakes MSN, RN, CNS, [place] CSerita Grammes [employer] F[employer]phone:  [phone]

## 2022-11-15 NOTE — Progress Notes (Signed)
Carlos Stone [id number] Dec 12, 1949  CARE TEAM:  PCP: [name], MD  Outpatient Care Team: Patient Care Team: [name], MD as PCP - General (Family Medicine) [name], MD as Consulting Physician (General Surgery) [name], MD as Consulting Physician (Gastroenterology)  Inpatient Treatment Team: Treatment Team: Attending Provider: [name], MD; [place] Nurse: McNichol, Rudi Heap, RN; Registered Nurse: Alphonsus Sias, RN; Registered Nurse: Harrel Carina, RN; Registered Nurse: Marlene Bast, RN; Charge Nurse: Quincy Simmonds, RN   Problem List:   Principal Problem:   Diverticulitis with abscess s/p sigmoid colectomy 11/10/2022 Active Problems:   Colonic diverticular abscess   Diverticulitis   2 Days Post-Op  11/10/2022  POST-OPERATIVE DIAGNOSIS:  SIGMOID DIVERTICULITIS WITH ABSCESS   PROCEDURE:   ROBOTIC LOW ANTERIOR RECTOSIGMOID RESECTION INTRAOPERATIVE ASSESSMENT OF PERFUSION USING FIREFLY TRANSVERSUS ABDOMINIS PLANE (TAP) BLOCK - BILATERAL RIGID PROCTOSCOPY   SURGEON:  [name], MD   OR FINDINGS:   Patient had inflamed mid distal sigmoid colon with abscess between sigmoid colon and dome of the bladder.  No strong evidence of fistula or cystitis.   No obvious metastatic disease on visceral parietal peritoneum or liver.   The anastomosis rests 12 cm from the anal verge by rigid proctoscopy.   It is a descending colon to proximal rectal 29 EEA stapled anastomosis   CASE DATA:   Type of patient?: Elective [facility] Private Case Status of Case? Elective Scheduled Infection Present At Time Of Surgery (PATOS)?  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   PROCEDURE:   Diagnostic laparoscopy. Washout and drainage of intra-abdominal  abscesses x 2. Diverting loop ileostomy.   SURGEON:  [name], MD   ASSISTANT: [name], MD  OR FINDINGS:    Patient had moderate peritonitis with evidence of some feculent peritonitis.  Interloop infraumbilical retroperitoneal abscess is walled off by small bowel that tract to the right anterior colorectal anastomosis - epicenter of inflammation and phlegmon suspicious for microperforation there.  No evidence of any major ischemia or necrosis of the colon nor rectum.  Inflammatory adhesions causing partial small bowel obstruction released.  No evidence of any small bowel perforation or injury nor gastroduodenal ulceration or perforation.  Extensive washout done.  Diverting loop ileostomy done.   Difficult nasogastric tube intubation ultimately successful with placing Ewall tube and dilating to pass 18 French nasogastric tube to decompress stomach with fecalization   Assessment  Guarded  [facility] Stay = 5 days)  Plan:  Drop in hemoglobin.  Transfused 2 units.  Hold anticoagulation x 48 hours.  NG tube until return of bowel function.  DC rectal tube - delayed but now done  Ileostomy care.- WOCN to see  Keep drain for several weeks.  Most likely need follow-up and enema & drain contrast study in a few weeks to make sure that delayed leak has closed down.  Hartmann resection if worse.  Hopefully not likely.  IV antibiotics x 5 days postop minimal.  Doing cefepime and metronidazole given his allergies to numerous other medications.  Low threshold to repeat CAT scan 1/19 if not better to rule out delayed abscess formation.  Try and get up and mobilize.  Up in a chair.  Hopefully can transfer out of stepdown unit if hemoglobin remained stable and improved Tuesday.    I updated the patient's status to  the patient and spouse beginning with nurse [name] through the night and also this morning.  Recommendations were made.  Questions were answered.  They expressed understanding  & appreciation.   Disposition: TBD       I reviewed nursing notes, last 24 h vitals and pain scores, last 48 h intake and output, last 24 h labs and trends, and last 24 h imaging results. I have reviewed this patient's available data, including medical history, events of note, test results, etc as part of my evaluation.  A significant portion of that time was spent in counseling.  Care during the described time interval was provided by me.  This care required moderate level of medical decision making.  11/15/2022    Subjective: (Chief complaint)  No major events.  Rectal tube removal delayed but now out.  Drifting down and hemoglobin.  Abdominal pain last but still moderate.  Wife remains in room.  Nursing just outside room.  Objective:  Vital signs:  Vitals:   11/15/22 0336 11/15/22 0400 11/15/22 0500 11/15/22 0600  BP: 109/63 91/63 106/66 108/65  Pulse: (!) 103 91 (!) 102 97  Resp: 20 (!) '23 19 19  '$ Temp: 99.5 F (37.5 C)     TempSrc: Oral     SpO2: 97% 95% 97% 93%  Weight:      Height:        Last BM Date : 11/14/22  Intake/Output   Yesterday:  01/14 0701 - 01/15 0700 In: 3114.9 [P.O.:180; I.V.:1828.5; IV Piggyback:1106.4] Out: 2400 [Urine:775; Emesis/NG output:1075; Drains:100; Stool:450] This shift:  Total I/O In: 1530.4 [P.O.:60; I.V.:934.8; IV Piggyback:535.5] Out: 1700 [Urine:475; Emesis/NG output:750; Drains:25; Stool:450]  Bowel function:  Flatus: No  BM:   AScant thick effluent in bag  Drain (19 Fr Blake drain in pelvis) : Serosanguinous   Physical Exam:  General: Pt awake/alert in no acute distress Eyes: PERRL, normal EOM.  Sclera clear.  No icterus Neuro: CN II-XII intact w/o focal sensory/motor deficits. Lymph: No head/neck/groin lymphadenopathy Psych:  No delerium/psychosis/paranoia.  Oriented x 4.  Mildly anxious and depressed but consolable. HENT: Normocephalic, Mucus membranes moist.  No thrush Neck: Supple, No tracheal  deviation.  No obvious thyromegaly Chest: No pain to chest wall compression.  Good respiratory excursion.  No audible wheezing CV:  Pulses intact.  Regular rhythm.  No major extremity edema MS: Normal AROM mjr joints.  No obvious deformity  Abdomen: Soft.  Mildy distended.  Mildly tender at incisions only.  No peritonitis.  No incarcerated hernias. Ileostomy right infraumbilical paramedian with some edema.  Viable.  Incisions clean dry intact.    GU: Clear light yellow urine in bag. Rectal: 30 French Foley balloon as rectal tube with minimal output. Ext:   No deformity.  No mjr edema.  No cyanosis Skin: No petechiae / purpurea.  No major sores.  Warm and dry    Results:   Cultures: Recent Results (from the past 720 hour(s))  MRSA Next Gen by PCR, Nasal     Status: None   Collection Time: 11/14/22  1:30 AM   Specimen: Nasal Mucosa; Nasal Swab  Result Value Ref Range Status   MRSA by PCR Next Gen NOT DETECTED NOT DETECTED Final    Comment: (NOTE) The GeneXpert MRSA Assay (FDA approved for NASAL specimens only), is one component of a comprehensive MRSA colonization surveillance program. It is not intended to diagnose MRSA infection nor to guide or monitor treatment for MRSA infections. Test performance is not FDA approved  in patients less than 43 years old. Performed at [facility], [place] [address] [place], [place] [address]     Labs: Results for orders placed or performed during the hospital encounter of 11/10/22 (from the past 48 hour(s))  CBC     Status: Abnormal   Collection Time: 11/13/22 10:20 AM  Result Value Ref Range   WBC 15.3 (H) 4.0 - 10.5 K/uL   RBC 2.81 (L) 4.22 - 5.81 MIL/uL   Hemoglobin 8.3 (L) 13.0 - 17.0 g/dL   HCT 25.3 (L) 39.0 - 52.0 %   MCV 90.0 80.0 - 100.0 fL   MCH 29.5 26.0 - 34.0 pg   MCHC 32.8 30.0 - 36.0 g/dL   RDW 12.9 11.5 - 15.5 %   Platelets 225 150 - 400 K/uL   nRBC 0.0 0.0 - 0.2 %    Comment: Performed at [facility], [place] [address], [place], [place] [address]  Creatinine, serum     Status: None   Collection Time: 11/13/22 10:20 AM  Result Value Ref Range   Creatinine, Ser 0.77 0.61 - 1.24 mg/dL   GFR, Estimated >60 >60 mL/min    Comment: (NOTE) Calculated using the CKD-EPI Creatinine Equation (2021) Performed at [facility], [place] [address], [place], [place] [address]   Potassium     Status: None   Collection Time: 11/13/22 10:20 AM  Result Value Ref Range   Potassium 4.0 3.5 - 5.1 mmol/L    Comment: Performed at [facility], [place] [address], [place], [place] [address]  I-STAT 7, (LYTES, BLD GAS, ICA, H+H)     Status: Abnormal   Collection Time: 11/13/22  9:55 PM  Result Value Ref Range   pH, Arterial 7.335 (L) 7.35 - 7.45   pCO2 arterial 46.9 32 - 48 mmHg   pO2, Arterial 94 83 - 108 mmHg   Bicarbonate 25.0 20.0 - 28.0 mmol/L   TCO2 26 22 - 32 mmol/L   O2 Saturation 97 %   Acid-base deficit 1.0 0.0 - 2.0 mmol/L   Sodium 136 135 - 145 mmol/L   Potassium 4.9 3.5 - 5.1 mmol/L   Calcium, Ion 1.26 1.15 - 1.40 mmol/L   HCT 24.0 (L) 39.0 - 52.0 %   Hemoglobin 8.2 (L) 13.0 - 17.0 g/dL   Sample type ARTERIAL   MRSA Next Gen by PCR, Nasal     Status: None   Collection Time: 11/14/22  1:30 AM   Specimen: Nasal Mucosa; Nasal Swab  Result Value Ref Range   MRSA by PCR Next Gen NOT DETECTED NOT DETECTED    Comment: (NOTE) The GeneXpert MRSA Assay (FDA approved for NASAL specimens only), is one component of a comprehensive MRSA colonization surveillance program. It is not intended to diagnose MRSA infection nor to guide or monitor treatment for MRSA infections. Test performance is not FDA approved in patients less than 49 years old. Performed at [facility], [place] [address], [place], [place] [address]   Magnesium     Status: None   Collection Time: 11/14/22  2:42 AM  Result Value Ref Range   Magnesium 2.0 1.7 - 2.4 mg/dL    Comment:  Performed at [facility], [place] [address], [place], [place] [address]  CBC     Status: Abnormal   Collection Time: 11/14/22  2:42 AM  Result Value Ref Range   WBC 17.3 (H) 4.0 - 10.5 K/uL   RBC 2.91 (  L) 4.22 - 5.81 MIL/uL   Hemoglobin 8.5 (L) 13.0 - 17.0 g/dL   HCT 26.2 (L) 39.0 - 52.0 %   MCV 90.0 80.0 - 100.0 fL   MCH 29.2 26.0 - 34.0 pg   MCHC 32.4 30.0 - 36.0 g/dL   RDW 13.1 11.5 - 15.5 %   Platelets 248 150 - 400 K/uL   nRBC 0.0 0.0 - 0.2 %    Comment: Performed at [facility], [place] [address], [place], [place] [address]  Basic metabolic panel     Status: Abnormal   Collection Time: 11/14/22  2:42 AM  Result Value Ref Range   Sodium 135 135 - 145 mmol/L   Potassium 4.2 3.5 - 5.1 mmol/L   Chloride 103 98 - 111 mmol/L   CO2 26 22 - 32 mmol/L   Glucose, Bld 168 (H) 70 - 99 mg/dL    Comment: Glucose reference range applies only to samples taken after fasting for at least 8 hours.   BUN 19 6 - 20 mg/dL   Creatinine, Ser 0.84 0.61 - 1.24 mg/dL   Calcium 8.2 (L) 8.9 - 10.3 mg/dL   GFR, Estimated >60 >60 mL/min    Comment: (NOTE) Calculated using the CKD-EPI Creatinine Equation (2021)    Anion gap 6 5 - 15    Comment: Performed at [facility], [place] [address] [place], [place] [address]  Type and screen [place]     Status: None   Collection Time: 11/14/22  2:58 AM  Result Value Ref Range   ABO/RH(D) O POS    Antibody Screen NEG    Sample Expiration      11/17/2022,2359 Performed at [facility], [place] [address] [place], [place] [address]   Potassium     Status: None   Collection Time: 11/15/22  3:33 AM  Result Value Ref Range   Potassium 3.8 3.5 - 5.1 mmol/L    Comment: Performed at [facility], [place] [address], [place], [place] [address]  Creatinine, serum     Status: None   Collection Time: 11/15/22  3:33 AM  Result Value Ref Range   Creatinine, Ser  0.81 0.61 - 1.24 mg/dL   GFR, Estimated >60 >60 mL/min    Comment: (NOTE) Calculated using the CKD-EPI Creatinine Equation (2021) Performed at [facility], [place] [address] [place], [place] [address]   CBC     Status: Abnormal   Collection Time: 11/15/22  3:33 AM  Result Value Ref Range   WBC 14.2 (H) 4.0 - 10.5 K/uL   RBC 2.27 (L) 4.22 - 5.81 MIL/uL   Hemoglobin 6.7 (LL) 13.0 - 17.0 g/dL    Comment: REPEATED TO VERIFY THIS CRITICAL RESULT HAS VERIFIED AND BEEN CALLED TO [place] BY PATRICIA LUZOLO ON 01 15 2024 AT 0431, AND HAS BEEN READ BACK. CRIRICAL RESULT VERIFIED    HCT 20.6 (L) 39.0 - 52.0 %   MCV 90.7 80.0 - 100.0 fL   MCH 29.5 26.0 - 34.0 pg   MCHC 32.5 30.0 - 36.0 g/dL   RDW 13.2 11.5 - 15.5 %   Platelets 254 150 - 400 K/uL   nRBC 0.0 0.0 - 0.2 %    Comment: Performed at [facility], [place] [address] [place], [place] [address]    Imaging / Studies: CT ABDOMEN PELVIS W CONTRAST  Result Date: 11/13/2022 CLINICAL DATA:  Abdominal pain, postop day 3 after partial colon  resection and reanastomosis, history of colovesical fistula EXAM: CT ABDOMEN AND PELVIS WITH CONTRAST TECHNIQUE: Multidetector CT imaging of the abdomen and pelvis was performed using the standard protocol following bolus administration of intravenous contrast. RADIATION DOSE REDUCTION: This exam was performed according to the departmental dose-optimization program which includes automated exposure control, adjustment of the mA and/or kV according to patient size and/or use of iterative reconstruction technique. CONTRAST:  133m OMNIPAQUE IOHEXOL 300 MG/ML  SOLN COMPARISON:  07/23/2022 FINDINGS: Lower chest: Hypoventilatory changes are seen at the lung bases. Trace bilateral pleural effusions, right greater than left. Hepatobiliary: No focal liver abnormality is seen. No gallstones, gallbladder wall thickening, or biliary dilatation. Pancreas: Unremarkable. No pancreatic  ductal dilatation or surrounding inflammatory changes. Spleen: Normal in size without focal abnormality. Adrenals/Urinary Tract: The kidneys enhance normally and symmetrically. No urinary tract calculi or obstructive uropathy. The adrenals are unremarkable. The bladder is moderately distended without filling defect. Punctate foci of intraluminal gas are nonspecific and could reflect recent catheterization. Stomach/Bowel: Postsurgical changes from partial sigmoid colon resection and reanastomosis. The amount of intra-abdominal gas and fluid is more than expected on postoperative day 3, and anastomotic leak cannot be excluded. Further evaluation with CT with rectal contrast, or contrast enema, may be useful. No evidence of high-grade bowel obstruction. Mild distension of the proximal small bowel with scattered gas fluid levels may reflect postoperative ileus. Vascular/Lymphatic: No significant vascular findings are present. No enlarged abdominal or pelvic lymph nodes. Reproductive: Prostate is unremarkable. Other: There is significant pneumoperitoneum and free fluid throughout the abdomen and pelvis, more than would be expected for postoperative day 3 after abdominal surgery. Prominent gas and fluid adjacent to the anastomotic staple line in the upper pelvis suggests underlying anastomotic leak or dehiscence. No loculated fluid collection or drainable abscess at this time. No abdominal wall hernia. Musculoskeletal: No acute or destructive bony lesions. Reconstructed images demonstrate no additional findings. IMPRESSION: 1. Interval postoperative changes from sigmoid colon resection and reanastomosis. There is fluid and gas surrounding the anastomotic site, with significant pneumoperitoneum and free intra-abdominal fluid more than would be expected for postoperative day 3 after surgery. Findings are concerning for anastomotic leak or dehiscence. If further evaluation is desired, CT after rectal contrast or contrast  enema under fluoroscopy could be considered. 2. Mild diffuse distension of the small bowel with scattered gas fluid levels, most consistent with postoperative ileus. 3. Punctate foci of gas within the bladder lumen, nonspecific. Please correlate with any recent catheterization. 4. Bibasilar hypoventilatory changes and trace bilateral pleural effusions. Critical Value/emergent results were called by telephone at the time of interpretation on 11/13/2022 at 6:50 pm to provider ERIC WILSON , who verbally acknowledged these results. Electronically Signed   By: MRanda NgoM.D.   On: 11/13/2022 18:54    Medications / Allergies: per chart  Antibiotics: Anti-infectives (From admission, onward)    Start     Dose/Rate Route Frequency Ordered Stop   11/13/22 2000  ceFEPIme (MAXIPIME) 2 g in sodium chloride 0.9 % 100 mL IVPB  Status:  Discontinued       Note to Pharmacy: Pharmacy may adjust dosing strength, schedule, rate of infusion, etc as needed to optimize therapy   2 g 200 mL/hr over 30 Minutes Intravenous Every 8 hours 11/13/22 1913 11/13/22 1925   11/13/22 2000  metroNIDAZOLE (FLAGYL) IVPB 500 mg  Status:  Discontinued        500 mg 100 mL/hr over 60 Minutes Intravenous Every 6 hours 11/13/22 1913 11/13/22  1924   11/13/22 1800  ceFEPIme (MAXIPIME) 2 g in sodium chloride 0.9 % 100 mL IVPB        2 g 200 mL/hr over 30 Minutes Intravenous Every 8 hours 11/13/22 1619 11/19/22 1059   11/13/22 1800  metroNIDAZOLE (FLAGYL) IVPB 500 mg        500 mg 100 mL/hr over 60 Minutes Intravenous Every 12 hours 11/13/22 1619 11/19/22 1359   11/10/22 1430  clindamycin (CLEOCIN) IVPB 900 mg        900 mg 100 mL/hr over 30 Minutes Intravenous Every 8 hours 11/10/22 1331 11/10/22 1658   11/10/22 1400  neomycin (MYCIFRADIN) tablet 1,000 mg  Status:  Discontinued       See Hyperspace for full Linked Orders Report.   1,000 mg Oral 3 times per day 11/10/22 0637 11/10/22 0643   11/10/22 1400  metroNIDAZOLE (FLAGYL)  tablet 1,000 mg  Status:  Discontinued       See Hyperspace for full Linked Orders Report.   1,000 mg Oral 3 times per day 11/10/22 0637 11/10/22 0643   11/10/22 0645  ertapenem (INVANZ) 1,000 mg in sodium chloride 0.9 % 100 mL IVPB  Status:  Discontinued        1 g 200 mL/hr over 30 Minutes Intravenous On call to O.R. 11/10/22 4098 11/10/22 1328         Note: Portions of this report may have been transcribed using voice recognition software. Every effort was made to ensure accuracy; however, inadvertent computerized transcription errors may be present.   Any transcriptional errors that result from this process are unintentional.    [name], MD, FACS, MASCRS Esophageal, Gastrointestinal & Colorectal Surgery Robotic and Minimally Invasive Surgery  [facility]. [address] [place], [place] [address] [phone] Fax [phone] Main  CONTACT INFORMATION:  Weekday (9AM-5PM): Call [facility] main office at [phone]  Weeknight (5PM-9AM) or Weekend/Holiday: Check [email or web address] (password " [email or web address]") for General Surgery [facility] coverage  (Please, do not use SecureChat as it is not reliable communication to reach operating surgeons for immediate patient care given surgeries/outpatient duties/clinic/cross-coverage/off post-call which would lead to a delay in care.  Epic staff messaging available for outptient concerns, but may not be answered for 48 hours or more).     11/15/2022  6:29 AM

## 2022-11-16 ENCOUNTER — Inpatient Hospital Stay (HOSPITAL_COMMUNITY): Payer: No Typology Code available for payment source

## 2022-11-16 LAB — CBC
HCT: 24.6 % — ABNORMAL LOW (ref 39.0–52.0)
Hemoglobin: 7.8 g/dL — ABNORMAL LOW (ref 13.0–17.0)
MCH: 28.5 pg (ref 26.0–34.0)
MCHC: 31.7 g/dL (ref 30.0–36.0)
MCV: 89.8 fL (ref 80.0–100.0)
Platelets: 263 10*3/uL (ref 150–400)
RBC: 2.74 MIL/uL — ABNORMAL LOW (ref 4.22–5.81)
RDW: 14.3 % (ref 11.5–15.5)
WBC: 11.6 10*3/uL — ABNORMAL HIGH (ref 4.0–10.5)
nRBC: 0 % (ref 0.0–0.2)

## 2022-11-16 LAB — CREATININE, SERUM
Creatinine, Ser: 0.81 mg/dL (ref 0.61–1.24)
GFR, Estimated: >60 mL/min (ref >60)

## 2022-11-16 LAB — TYPE AND SCREEN
ABO/RH(D): O POS
Antibody Screen: NEGATIVE
Unit division: 0
Unit division: 0

## 2022-11-16 LAB — BPAM RBC
Blood Product Expiration Date: 202402132359
Blood Product Expiration Date: 202402192359
ISSUE DATE / TIME: 202401150742
ISSUE DATE / TIME: 202401151007
Unit Type and Rh: 5100
Unit Type and Rh: 5100

## 2022-11-16 LAB — POTASSIUM: Potassium: 3.8 mmol/L (ref 3.5–5.1)

## 2022-11-16 MED ORDER — PHENOL 1.4 % MT LIQD
1.0000 | OROMUCOSAL | Status: DC | PRN
  Filled 2022-11-16: qty 177

## 2022-11-16 MED ORDER — SODIUM CHLORIDE 0.9 % IV SOLN
125.0000 mg | Freq: Once | INTRAVENOUS | Status: AC
  Administered 2022-11-16: 125 mg via INTRAVENOUS
  Filled 2022-11-16: qty 10

## 2022-11-16 MED ORDER — ESCITALOPRAM OXALATE 20 MG PO TABS
20.0000 mg | ORAL_TABLET | Freq: Every day | ORAL | Status: DC
  Administered 2022-11-16: 20 mg via ORAL
  Filled 2022-11-16: qty 1

## 2022-11-16 NOTE — Progress Notes (Signed)
Patient Carlos Stone was paged because is asking for his Lexapro '20mg'$  PO that he takes daily. Verbal order for Lexapro 20 PO.

## 2022-11-16 NOTE — Progress Notes (Signed)
Per x-ray, NGT needs to be retracted 10 cm. I pulled NGT out 10 cm and retaped. Gastrc contents started suctioning via NGT.

## 2022-11-16 NOTE — Plan of Care (Signed)
Nutrition Education Note  RD consulted for nutrition education regarding new ileostomy.  Patient sleeping at time of visit but wife present and agreeable to receive diet education. RD provided "Ileostomy Nutrition Therapy" handout from the Academy of Nutrition and Dietetics. Reviewed patient's dietary recall. Discussed different tips for new ileostomy such as small and frequent meals, consuming adequate fluid intake which is spaced away from meal times, and eating slowly and chewing food well. Wife reports patient typically eats very quickly to this will be an adjustment.   RD discussed food recommended as well as foods not recommended after surgery. Reviewed foods that can cause odor, gas, blockages, and diarrhea. RD discussed why it is important for patient to adhere to diet recommendations. Recommended patient keep food journal to document trying of new foods and how ostomy output is affected. Teach back method used.  Expect good compliance.  Body mass index is 30.67 kg/m. Pt meets criteria for Obesity I  based on current BMI.  Current diet order is Clear Liquids, diet just advanced this AM and wife reports patient has tolerated water and grape juice so far this morning. Can add Ensure Max BID once diet advanced to full liquids to support intake and healing. Labs and medications reviewed. No further nutrition interventions warranted at this time. RD contact information provided. If additional nutrition issues arise, please re-consult RD.   Samson Frederic RD, LDN For contact information, refer to [facility].

## 2022-11-16 NOTE — Consult Note (Signed)
[place] Nurse ostomy follow up Patient established with [employer] post discharge sampling and support program today. Supplies requested: Four (4) 2 and 1/4 inch skin barriers (CeraPlus) Four (4) opaque pouches with integrated gas filter Four (4) CeraPlus standard 2-inch skin barrier rings 1 bottle stoma powder 4 high put pouches (2 and 1/4 inches)  Patient would benefit from [facility] services post discharge as well as a referral to the outpatient ostomy clinic. If you agree, please order/arrange.  [place] nursing team will follow, and will remain available to this patient, the nursing and medical teams.    Thank you for inviting [place] to participate in this patient's Plan of Care.  Maudie Flakes, MSN, RN, CNS, [place], Serita Grammes, [employer], [employer] phone:  [phone]

## 2022-11-16 NOTE — Progress Notes (Signed)
Report given to Agricultural consultant. Patient being transferred to 1303.

## 2022-11-16 NOTE — Progress Notes (Signed)
Carlos Stone [id number] 07-17-1976  CARE TEAM:  PCP: [name], MD  Outpatient Care Team: Patient Care Team: [name], MD as PCP - General (Family Medicine) [name], MD as Consulting Physician (General Surgery) [name], MD as Consulting Physician (Gastroenterology)  Inpatient Treatment Team: Treatment Team: Attending Provider: [name], MD; [place] Nurse: McNichol, Rudi Heap, RN; Charge Nurse: Quincy Simmonds, RN; Registered Nurse: Cristino Martes, RN; Registered Nurse: Marzetta Merino, RN; Utilization Review: Lacretia Leigh, RN; Pharmacist: Eudelia Bunch, [facility]   Problem List:   Principal Problem:   Diverticulitis with abscess s/p sigmoid colectomy 11/10/2022 Active Problems:   Colonic diverticular abscess   Diverticulitis   3 Days Post-Op  11/10/2022  POST-OPERATIVE DIAGNOSIS:  SIGMOID DIVERTICULITIS WITH ABSCESS   PROCEDURE:   ROBOTIC LOW ANTERIOR RECTOSIGMOID RESECTION INTRAOPERATIVE ASSESSMENT OF PERFUSION USING FIREFLY TRANSVERSUS ABDOMINIS PLANE (TAP) BLOCK - BILATERAL RIGID PROCTOSCOPY   SURGEON:  [name], MD   OR FINDINGS:   Patient had inflamed mid distal sigmoid colon with abscess between sigmoid colon and dome of the bladder.  No strong evidence of fistula or cystitis.   No obvious metastatic disease on visceral parietal peritoneum or liver.   The anastomosis rests 12 cm from the anal verge by rigid proctoscopy.   It is a descending colon to proximal rectal 29 EEA stapled anastomosis   CASE DATA:   Type of patient?: Elective [facility] Private Case Status of Case? Elective Scheduled Infection Present At Time Of Surgery (PATOS)?  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   PROCEDURE:   Diagnostic  laparoscopy. Washout and drainage of intra-abdominal abscesses x 2. Diverting loop ileostomy.   SURGEON:  [name], MD   ASSISTANT: [name], MD  OR FINDINGS:    Patient had moderate peritonitis with evidence of some feculent peritonitis.  Interloop infraumbilical retroperitoneal abscess is walled off by small bowel that tract to the right anterior colorectal anastomosis - epicenter of inflammation and phlegmon suspicious for microperforation there.  No evidence of any major ischemia or necrosis of the colon nor rectum.  Inflammatory adhesions causing partial small bowel obstruction released.  No evidence of any small bowel perforation or injury nor gastroduodenal ulceration or perforation.  Extensive washout done.  Diverting loop ileostomy done.   Difficult nasogastric tube intubation ultimately successful with placing Ewall tube and dilating to pass 18 French nasogastric tube to decompress stomach with fecalization   Assessment  Stabilizing [facility] Stay = 6 days)  Plan:  Drop in hemoglobin.  Transfused 2 units.  Give IV iron.  Hold anticoagulation x 48 hours.  Transfuse as needed.  Moderate NG tube output but gone down the last shift with flatus and stool in ileostomy.  Will try NG tube clamping dry with clear liquids.  If he tolerates it, most likely pull NG tube and advance tomorrow.  If has recurrent pain or discomfort return to suction and try again.    Ileostomy care.- [facility]OCN have seen.  Once NG tube out and starting p.o., do ileostomy regimen usually of fiber/iron/Imodium twice daily and adjust.  Hartmann resection if worse.  Hopefully not likely.  IV antibiotics x 5 days postop minimal.  Doing cefepime and metronidazole given his allergies to numerous other medications.  Low threshold to repeat CAT scan 1/19 if not better to rule out delayed  abscess formation.  Try and get up and mobilize.  Up in a chair.  Will do physical therapy consultation per ICU nursing rec's  to be safe.  Safe to transfer to floor since hemoglobin more stable and improved clinically.    Keep [facility] surgical drain for several weeks.    Most likely need follow-up and enema & drain contrast study in a few weeks to make sure that delayed leak has closed down.  Pathology consistent with diverticulitis.  Again discussed with patient and wife at bedside.  If he can get through all this safely will plan loop ileostomy takedown in 3 months as long as leak has resolved.  If not closed by 6 months may need to revise the anastomosis.  Hopefully not too likely.  We will see. I updated the patient's status to the patient and spouse & ICU RN.  Recommendations were made.  Questions were answered.  They expressed understanding & appreciation.   Disposition: TBD       I reviewed nursing notes, last 24 h vitals and pain scores, last 48 h intake and output, last 24 h labs and trends, and last 24 h imaging results. I have reviewed this patient's available data, including medical history, events of note, test results, etc as part of my evaluation.  A significant portion of that time was spent in counseling.  Care during the described time interval was provided by me.  This care required moderate level of medical decision making.  11/16/2022    Subjective: (Chief complaint)  Patient feeling much better overall.  Much less abdominal pain.  Started to have flatus and bowel movements in ileostomy bag.  Rectal drainage/oozing has gone away.  Thirsty.  Stepdown nursing concern he is a little weak and wonder if physical therapy consult appropriate.  Objective:  Vital signs:  Vitals:   11/16/22 0349 11/16/22 0400 11/16/22 0500 11/16/22 0600  BP:  111/70 121/70 131/73  Pulse:  77 77 75  Resp:  '15 19 19  '$ Temp: 99.8 F (37.7 C)     TempSrc: Oral     SpO2:  98% 97% 98%  Weight:      Height:        Last BM Date : 11/15/22  Intake/Output   Yesterday:  01/15 0701 - 01/16  0700 In: 1761.2 [P.O.:30; I.V.:679.8; Blood:494; NG/GT:90; IV Piggyback:467.4] Out: 2860 [Urine:1110; Emesis/NG output:1400; Drains:50; Stool:300] This shift:  No intake/output data recorded.  Bowel function:  Flatus: YES  BM:  YES  Drain (19 Fr Blake drain in pelvis) : Serosanguinous   Physical Exam:  General: Pt awake/alert in no acute distress.  More relaxed.  Moving more easily.  More interactive and talkative -this the best I have seen him since surgery. Eyes: PERRL, normal EOM.  Sclera clear.  No icterus Neuro: CN II-XII intact w/o focal sensory/motor deficits. Lymph: No head/neck/groin lymphadenopathy Psych:  No delerium/psychosis/paranoia.  Oriented x 4.  Mildly anxious and depressed but consolable. HENT: Normocephalic, Mucus membranes moist.  No thrush Neck: Supple, No tracheal deviation.  No obvious thyromegaly Chest: No pain to chest wall compression.  Good respiratory excursion.  No audible wheezing CV:  Pulses intact.  Regular rhythm.  No major extremity edema MS: Normal AROM mjr joints.  No obvious deformity  Abdomen: Soft.  Nondistended.  Mildly tender at incisions only.  No peritonitis.  No incarcerated hernias. Ileostomy right infraumbilical paramedian with some edema.  Viable.  Incisions clean dry intact.    GU: Clear light  yellow urine in bag. Rectal: 30 French Foley balloon as rectal tube with minimal output. Ext:   No deformity.  No mjr edema.  No cyanosis Skin: No petechiae / purpurea.  No major sores.  Warm and dry    Results:   Cultures: Recent Results (from the past 720 hour(s))  MRSA Next Gen by PCR, Nasal     Status: None   Collection Time: 11/14/22  1:30 AM   Specimen: Nasal Mucosa; Nasal Swab  Result Value Ref Range Status   MRSA by PCR Next Gen NOT DETECTED NOT DETECTED Final    Comment: (NOTE) The GeneXpert MRSA Assay (FDA approved for NASAL specimens only), is one component of a comprehensive MRSA colonization surveillance program. It is  not intended to diagnose MRSA infection nor to guide or monitor treatment for MRSA infections. Test performance is not FDA approved in patients less than 46 years old. Performed at [facility], [place] [address] [place], [place] [address]     Labs: Results for orders placed or performed during the hospital encounter of 11/10/22 (from the past 48 hour(s))  Potassium     Status: None   Collection Time: 11/15/22  3:33 AM  Result Value Ref Range   Potassium 3.8 3.5 - 5.1 mmol/L    Comment: Performed at [facility], [place] [address], [place], [place] [address]  Creatinine, serum     Status: None   Collection Time: 11/15/22  3:33 AM  Result Value Ref Range   Creatinine, Ser 0.81 0.61 - 1.24 mg/dL   GFR, Estimated >60 >60 mL/min    Comment: (NOTE) Calculated using the CKD-EPI Creatinine Equation (2021) Performed at [facility], [place] [address], [place], [place] [address]   CBC     Status: Abnormal   Collection Time: 11/15/22  3:33 AM  Result Value Ref Range   WBC 14.2 (H) 4.0 - 10.5 K/uL   RBC 2.27 (L) 4.22 - 5.81 MIL/uL   Hemoglobin 6.7 (LL) 13.0 - 17.0 g/dL    Comment: REPEATED TO VERIFY THIS CRITICAL RESULT HAS VERIFIED AND BEEN CALLED TO [place] BY PATRICIA LUZOLO ON 01 15 2024 AT 0431, AND HAS BEEN READ BACK. CRIRICAL RESULT VERIFIED    HCT 20.6 (L) 39.0 - 52.0 %   MCV 90.7 80.0 - 100.0 fL   MCH 29.5 26.0 - 34.0 pg   MCHC 32.5 30.0 - 36.0 g/dL   RDW 13.2 11.5 - 15.5 %   Platelets 254 150 - 400 K/uL   nRBC 0.0 0.0 - 0.2 %    Comment: Performed at [facility], [place] [address], [place], [place] [address]  Prepare RBC (crossmatch)     Status: None   Collection Time: 11/15/22  5:19 AM  Result Value Ref Range   Order Confirmation      ORDER PROCESSED BY BLOOD BANK Performed at [facility], [place] [address] [place], [place] [address]   Hemoglobin and hematocrit, blood     Status: Abnormal    Collection Time: 11/15/22  2:22 PM  Result Value Ref Range   Hemoglobin 8.3 (L) 13.0 - 17.0 g/dL   HCT 26.1 (L) 39.0 - 52.0 %    Comment: Performed at [facility], [place] [address], [place],  [address]  Potassium     Status: None   Collection Time: 11/16/22  3:00 AM  Result Value Ref Range   Potassium 3.8 3.5 - 5.1 mmol/L  Comment: Performed at [facility], [place] [address], [place], [place] [address]  Creatinine, serum     Status: None   Collection Time: 11/16/22  3:00 AM  Result Value Ref Range   Creatinine, Ser 0.81 0.61 - 1.24 mg/dL   GFR, Estimated >60 >60 mL/min    Comment: (NOTE) Calculated using the CKD-EPI Creatinine Equation (2021) Performed at [facility], [place] [address] [place], [place] [address]   CBC     Status: Abnormal   Collection Time: 11/16/22  3:00 AM  Result Value Ref Range   WBC 11.6 (H) 4.0 - 10.5 K/uL   RBC 2.74 (L) 4.22 - 5.81 MIL/uL   Hemoglobin 7.8 (L) 13.0 - 17.0 g/dL   HCT 24.6 (L) 39.0 - 52.0 %   MCV 89.8 80.0 - 100.0 fL   MCH 28.5 26.0 - 34.0 pg   MCHC 31.7 30.0 - 36.0 g/dL   RDW 14.3 11.5 - 15.5 %   Platelets 263 150 - 400 K/uL   nRBC 0.0 0.0 - 0.2 %    Comment: Performed at [facility], [place] [address] [place], [place] [address]    Imaging / Studies: No results found.  Medications / Allergies: per chart  Antibiotics: Anti-infectives (From admission, onward)    Start     Dose/Rate Route Frequency Ordered Stop   11/13/22 2000  ceFEPIme (MAXIPIME) 2 g in sodium chloride 0.9 % 100 mL IVPB  Status:  Discontinued       Note to Pharmacy: Pharmacy may adjust dosing strength, schedule, rate of infusion, etc as needed to optimize therapy   2 g 200 mL/hr over 30 Minutes Intravenous Every 8 hours 11/13/22 1913 11/13/22 1925   11/13/22 2000  metroNIDAZOLE (FLAGYL) IVPB 500 mg  Status:  Discontinued        500 mg 100 mL/hr over 60 Minutes Intravenous Every 6  hours 11/13/22 1913 11/13/22 1924   11/13/22 1800  ceFEPIme (MAXIPIME) 2 g in sodium chloride 0.9 % 100 mL IVPB        2 g 200 mL/hr over 30 Minutes Intravenous Every 8 hours 11/13/22 1619 11/19/22 1059   11/13/22 1800  metroNIDAZOLE (FLAGYL) IVPB 500 mg        500 mg 100 mL/hr over 60 Minutes Intravenous Every 12 hours 11/13/22 1619 11/19/22 1359   11/10/22 1430  clindamycin (CLEOCIN) IVPB 900 mg        900 mg 100 mL/hr over 30 Minutes Intravenous Every 8 hours 11/10/22 1331 11/10/22 1658   11/10/22 1400  neomycin (MYCIFRADIN) tablet 1,000 mg  Status:  Discontinued       See Hyperspace for full Linked Orders Report.   1,000 mg Oral 3 times per day 11/10/22 0637 11/10/22 0643   11/10/22 1400  metroNIDAZOLE (FLAGYL) tablet 1,000 mg  Status:  Discontinued       See Hyperspace for full Linked Orders Report.   1,000 mg Oral 3 times per day 11/10/22 0637 11/10/22 0643   11/10/22 0645  ertapenem (INVANZ) 1,000 mg in sodium chloride 0.9 % 100 mL IVPB  Status:  Discontinued        1 g 200 mL/hr over 30 Minutes Intravenous On call to O.R. 11/10/22 7096 11/10/22 1328         Note: Portions of this report may have been transcribed using voice recognition software. Every effort was made to ensure accuracy; however, inadvertent computerized transcription errors may be present.   Any transcriptional  errors that result from this process are unintentional.    [name], MD, FACS, MASCRS Esophageal, Gastrointestinal & Colorectal Surgery Robotic and Minimally Invasive Surgery  [place] [address], [place],  [address] [phone] Fax [phone] Main  CONTACT INFORMATION:  Weekday (9AM-5PM): Call [facility] main office at [phone]  Weeknight (5PM-9AM) or Weekend/Holiday: Check [email or web address] (password " [email or web address]") for General Surgery [facility] coverage  (Please, do not use SecureChat as it is not reliable  communication to reach operating surgeons for immediate patient care given surgeries/outpatient duties/clinic/cross-coverage/off post-call which would lead to a delay in care.  Epic staff messaging available for outptient concerns, but may not be answered for 48 hours or more).     11/16/2022  7:56 AM

## 2022-11-17 LAB — CBC
HCT: 24.9 % — ABNORMAL LOW (ref 39.0–52.0)
Hemoglobin: 8.3 g/dL — ABNORMAL LOW (ref 13.0–17.0)
MCH: 28.9 pg (ref 26.0–34.0)
MCHC: 33.3 g/dL (ref 30.0–36.0)
MCV: 86.8 fL (ref 80.0–100.0)
Platelets: 340 10*3/uL (ref 150–400)
RBC: 2.87 MIL/uL — ABNORMAL LOW (ref 4.22–5.81)
RDW: 13.8 % (ref 11.5–15.5)
WBC: 11 10*3/uL — ABNORMAL HIGH (ref 4.0–10.5)
nRBC: 0 % (ref 0.0–0.2)

## 2022-11-17 LAB — GLUCOSE, CAPILLARY
Glucose-Capillary: 111 mg/dL — ABNORMAL HIGH (ref 70–99)
Glucose-Capillary: 117 mg/dL — ABNORMAL HIGH (ref 70–99)
Glucose-Capillary: 118 mg/dL — ABNORMAL HIGH (ref 70–99)
Glucose-Capillary: 125 mg/dL — ABNORMAL HIGH (ref 70–99)
Glucose-Capillary: 127 mg/dL — ABNORMAL HIGH (ref 70–99)

## 2022-11-17 LAB — CREATININE, SERUM
Creatinine, Ser: 0.82 mg/dL (ref 0.61–1.24)
GFR, Estimated: >60 mL/min (ref >60)

## 2022-11-17 LAB — POTASSIUM: Potassium: 3.8 mmol/L (ref 3.5–5.1)

## 2022-11-17 MED ORDER — ONDANSETRON HCL 4 MG/2ML IJ SOLN
4.0000 mg | Freq: Four times a day (QID) | INTRAMUSCULAR | Status: DC
  Administered 2022-11-17 – 2022-11-18 (×4): 4 mg via INTRAVENOUS
  Filled 2022-11-17 (×4): qty 2

## 2022-11-17 MED ORDER — DIPHENHYDRAMINE HCL 50 MG/ML IJ SOLN
25.0000 mg | Freq: Four times a day (QID) | INTRAMUSCULAR | Status: DC | PRN
  Administered 2022-11-18 – 2022-11-19 (×2): 25 mg via INTRAVENOUS
  Filled 2022-11-17 (×3): qty 1

## 2022-11-17 MED ORDER — ONDANSETRON HCL 4 MG/2ML IJ SOLN: 4.0000 mg | Freq: Four times a day (QID) | INTRAMUSCULAR | Status: DC | PRN

## 2022-11-17 MED ORDER — MAGIC MOUTHWASH
10.0000 mL | Freq: Four times a day (QID) | ORAL | Status: AC
  Administered 2022-11-17 – 2022-11-18 (×5): 10 mL via ORAL
  Filled 2022-11-17 (×8): qty 10

## 2022-11-17 MED ORDER — ORAL CARE MOUTH RINSE: 15.0000 mL | OROMUCOSAL | Status: DC | PRN

## 2022-11-17 MED ORDER — PANTOPRAZOLE SODIUM 40 MG IV SOLR
40.0000 mg | Freq: Every day | INTRAVENOUS | Status: DC
  Administered 2022-11-17 – 2022-11-18 (×2): 40 mg via INTRAVENOUS
  Filled 2022-11-17 (×2): qty 10

## 2022-11-17 MED ORDER — SODIUM CHLORIDE 0.9 % IV SOLN
250.0000 mg | Freq: Three times a day (TID) | INTRAVENOUS | Status: DC
  Administered 2022-11-17 – 2022-11-19 (×6): 250 mg via INTRAVENOUS
  Filled 2022-11-17 (×8): qty 5

## 2022-11-17 MED ORDER — SODIUM CHLORIDE 0.9 % IV SOLN
8.0000 mg | Freq: Four times a day (QID) | INTRAVENOUS | Status: DC | PRN
  Administered 2022-11-17: 8 mg via INTRAVENOUS
  Filled 2022-11-17 (×2): qty 4

## 2022-11-17 NOTE — Evaluation (Signed)
Physical Therapy Evaluation Patient Details Name: Carlos Stone MRN: [id number] DOB: Dec 07, 1975 Today's Date: 11/17/2022  History of Present Illness  47 y.o. 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 PMH: anxiety, meningitis.  Clinical Impression  Pt admitted with above diagnosis. Pt ambulated 180' with RW with min/guard for mild unsteadiness, no overt loss of balance, SpO2 95% on room air. Distance limited by fatigue and nausea. Pt currently with functional limitations due to the deficits listed below (see PT Problem List). Pt will benefit from skilled PT to increase their independence and safety with mobility to allow discharge to the venue listed below.          Recommendations for follow up therapy are one component of a multi-disciplinary discharge planning process, led by the attending physician.  Recommendations may be updated based on patient status, additional functional criteria and insurance authorization.  Follow Up Recommendations No PT follow up      Assistance Recommended at Discharge Intermittent Supervision/Assistance  Patient can return home with the following  A little help with walking and/or transfers;A little help with bathing/dressing/bathroom;Assistance with cooking/housework;Assist for transportation;Help with stairs or ramp for entrance    Equipment Recommendations Rolling walker (2 wheels)  Recommendations for Other Services       Functional Status Assessment Patient has had a recent decline in their functional status and demonstrates the ability to make significant improvements in function in a reasonable and predictable amount of time.     Precautions / Restrictions Precautions Precautions: Other (comment) Precaution Comments: abdominal surgery Restrictions Weight Bearing Restrictions: No      Mobility  Bed  Mobility Overal bed mobility: Needs Assistance Bed Mobility: Sit to Supine       Sit to supine: Min assist   General bed mobility comments: min A for LEs into bed    Transfers Overall transfer level: Needs assistance Equipment used: Rolling walker (2 wheels) Transfers: Sit to/from Stand Sit to Stand: Min guard           General transfer comment: VCs hand placement    Ambulation/Gait Ambulation/Gait assistance: Min guard Gait Distance (Feet): 180 Feet Assistive device: Rolling walker (2 wheels) Gait Pattern/deviations: Decreased stride length Gait velocity: decr     General Gait Details: slow cadence, pt reports some lightheadedness and nausea, min/guard for safety, mild unsteadiness but no overt loss of balance  Stairs            Wheelchair Mobility    Modified Rankin (Stroke Patients Only)       Balance Overall balance assessment: Needs assistance   Sitting balance-Leahy Scale: Good       Standing balance-Leahy Scale: Fair                               Pertinent Vitals/Pain Pain Assessment Pain Assessment: No/denies pain    Home Living Family/patient expects to be discharged to:: Private residence Living Arrangements: Spouse/significant other;Children Available Help at Discharge: Family   Home Access: Stairs to enter   Technical brewer of Steps: 7   Home Layout: One level Home Equipment: None      Prior Function Prior Level of Function : Independent/Modified Independent                     Hand Dominance        Extremity/Trunk Assessment  Upper Extremity Assessment Upper Extremity Assessment: Overall WFL for tasks assessed    Lower Extremity Assessment Lower Extremity Assessment: Overall WFL for tasks assessed    Cervical / Trunk Assessment Cervical / Trunk Assessment: Normal  Communication   Communication: No difficulties  Cognition Arousal/Alertness: Lethargic Behavior During Therapy: WFL  for tasks assessed/performed Overall Cognitive Status: Within Functional Limits for tasks assessed                                          General Comments      Exercises     Assessment/Plan    PT Assessment Patient needs continued PT services  PT Problem List Decreased activity tolerance;Decreased mobility       PT Treatment Interventions Gait training;Therapeutic activities;Therapeutic exercise    PT Goals (Current goals can be found in the Care Plan section)  Acute Rehab PT Goals Patient Stated Goal: likes to watch tv PT Goal Formulation: With patient/family Time For Goal Achievement: 12/01/22 Potential to Achieve Goals: Good    Frequency Min 3X/week     Co-evaluation               AM-PAC PT "6 Clicks" Mobility  Outcome Measure Help needed turning from your back to your side while in a flat bed without using bedrails?: None Help needed moving from lying on your back to sitting on the side of a flat bed without using bedrails?: A Little Help needed moving to and from a bed to a chair (including a wheelchair)?: A Little Help needed standing up from a chair using your arms (e.g., wheelchair or bedside chair)?: A Little Help needed to walk in hospital room?: A Little Help needed climbing 3-5 steps with a railing? : A Little 6 Click Score: 19    End of Session   Activity Tolerance: Patient limited by fatigue Patient left: in bed;with call bell/phone within reach;with family/visitor present Nurse Communication: Mobility status PT Visit Diagnosis: Difficulty in walking, not elsewhere classified (R26.2)    Time: 9622-2979 PT Time Calculation (min) (ACUTE ONLY): 17 min   Charges:   PT Evaluation $PT Eval Moderate Complexity: 1 Mod          Philomena Doheny PT 11/17/2022  Acute Rehabilitation Services  Office [phone]

## 2022-11-17 NOTE — Progress Notes (Addendum)
Carlos Stone [id number] [birth year]  CARE TEAM:  PCP: [name], MD  Outpatient Care Team: Patient Care Team: [name], MD as PCP - General (Family Medicine) [name], MD as Consulting Physician (General Surgery) [name], MD as Consulting Physician (Gastroenterology)  Inpatient Treatment Team: Treatment Team: Attending Provider: [name], MD; [place] Nurse: Lissa Morales Rudi Heap, RN; Physical Therapist: Lucile Crater, PT; Registered Nurse: Charlyne Petrin, RN; Utilization Review: Lacretia Leigh, RN; Pharmacist: Royetta Asal, [facility]   Problem List:   Principal Problem:   Diverticulitis with abscess s/p sigmoid colectomy 11/10/2022 Active Problems:   Colonic diverticular abscess   Diverticulitis   4 Days Post-Op  11/10/2022  POST-OPERATIVE DIAGNOSIS:  SIGMOID DIVERTICULITIS WITH ABSCESS   PROCEDURE:   ROBOTIC LOW ANTERIOR RECTOSIGMOID RESECTION INTRAOPERATIVE ASSESSMENT OF PERFUSION USING FIREFLY TRANSVERSUS ABDOMINIS PLANE (TAP) BLOCK - BILATERAL RIGID PROCTOSCOPY   SURGEON:  [name], MD   OR FINDINGS:   Patient had inflamed mid distal sigmoid colon with abscess between sigmoid colon and dome of the bladder.  No strong evidence of fistula or cystitis.   No obvious metastatic disease on visceral parietal peritoneum or liver.   The anastomosis rests 12 cm from the anal verge by rigid proctoscopy.   It is a descending colon to proximal rectal 29 EEA stapled anastomosis   CASE DATA:   Type of patient?: Elective [facility] Private Case Status of Case? Elective Scheduled Infection Present At Time Of Surgery (PATOS)?  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   PROCEDURE:   Diagnostic laparoscopy. Washout and drainage of  intra-abdominal abscesses x 2. Diverting loop ileostomy.   SURGEON:  [name], MD   ASSISTANT: [name], MD  OR FINDINGS:    Patient had moderate peritonitis with evidence of some feculent peritonitis.  Interloop infraumbilical retroperitoneal abscess is walled off by small bowel that tract to the right anterior colorectal anastomosis - epicenter of inflammation and phlegmon suspicious for microperforation there.  No evidence of any major ischemia or necrosis of the colon nor rectum.  Inflammatory adhesions causing partial small bowel obstruction released.  No evidence of any small bowel perforation or injury nor gastroduodenal ulceration or perforation.  Extensive washout done.  Diverting loop ileostomy done.   Difficult nasogastric tube intubation ultimately successful with placing Ewall tube and dilating to pass 18 French nasogastric tube to decompress stomach with fecalization  Pathology consistent with diverticulitis.     Assessment  Fair w ileus [facility] Stay = 7 days)  Plan:  Drop in hemoglobin.  Transfused 2 units.  Give IV iron.  Hold anticoagulation x 48 hours.  Transfuse as needed.  Hgb stable x48 hours = restart enoxaparin 1/17.  follow  Did not tolerate clamping trial.  Someone started pills.  Someone moved the NGT.  Do not do that - took 30 min to get an NGT to get placed correctly intraop.  Placed NGT back on suction.   Make Zofran scheduled.  He cannot tolerate Compazine and did not like Reglan, so options for nausea are limited.  Will add scheduled erythromycin to help. Hopefully will improve if ileus more consistently improves.  TPN if not better by 1/19  Ileostomy care.- WOCN have seen.  Once NG tube out and starting p.o., do ileostomy regimen usually of fiber/iron/Imodium twice daily and adjust.  Hartmann resection if worse.  Hopefully not likely.  IV antibiotics x 5 days postop minimal.  Doing cefepime and metronidazole given his allergies to numerous  other medications.  Low threshold to repeat CAT scan 1/19 if not better to rule out delayed abscess formation.  Keep [facility] surgical drain for several weeks.  Most likely need follow-up and enema & drain contrast study in a few weeks to make sure that delayed leak has closed down.  Try and get up and mobilize.  Up in a chair.  Will do physical therapy consultation per ICU nursing rec's to be safe.  If he can get through all this safely will plan loop ileostomy takedown in 3 months as long as leak has resolved.  If not closed by 6 months may need to revise the anastomosis.  Hopefully not too likely.  We will see.  I updated the patient's status to the patient and spouse & ICU RN.  Recommendations were made.  Questions were answered.  They expressed understanding & appreciation.   Disposition: TBD       I reviewed nursing notes, last 24 h vitals and pain scores, last 48 h intake and output, last 24 h labs and trends, and last 24 h imaging results. I have reviewed this patient's available data, including medical history, events of note, test results, etc as part of my evaluation.  A significant portion of that time was spent in counseling.  Care during the described time interval was provided by me.  This care required moderate level of medical decision making.  11/17/2022    Subjective: (Chief complaint)  Tranferred to floor  RN pulled NGT partially out based on Xray  Started on pills  Did not tolerate NGT clamping  Nauseated - doesn't like most nausea meds -   Wife in room   Objective:  Vital signs:  Vitals:   11/16/22 1833 11/16/22 2226 11/17/22 0135 11/17/22 0515  BP: 104/69 (!) 112/59 119/70 125/70  Pulse: 67 61 63 (!) 59  Resp: '16 18 18 18  '$ Temp: 99.8 F (37.7 C) 99.2 F (37.3 C) 99 F (37.2 C) 98.6 F (37 C)  TempSrc: Oral Oral Oral Oral  SpO2: 90% 97% 98% 98%  Weight:      Height:        Last BM Date : 11/16/22  Intake/Output   Yesterday:  01/16 0701  - 01/17 0700 In: 1490.9 [P.O.:60; I.V.:897.6; IV Piggyback:528.2] Out: 4970 [Urine:600; Emesis/NG output:1850; Drains:38; Stool:900] This shift:  Total I/O In: -  Out: 200 [Emesis/NG output:200]  Bowel function:  Flatus: YES  BM:  YES  Drain (19 Fr Blake drain in pelvis) : Serosanguinous   Physical Exam:  General: Pt awake/alert in mild acute distress.  Tired/exhausted Eyes: PERRL, normal EOM.  Sclera clear.  No icterus Neuro: CN II-XII intact w/o focal sensory/motor deficits. Lymph: No head/neck/groin lymphadenopathy Psych:  No delerium/psychosis/paranoia.  Oriented x 4.  Mildly anxious and depressed but consolable. HENT: Normocephalic, Mucus membranes moist.  No thrush Neck: Supple, No tracheal deviation.  No obvious thyromegaly Chest: No pain to chest wall compression.  Good respiratory excursion.  No audible wheezing CV:  Pulses intact.  Regular rhythm.  No major extremity edema MS: Normal AROM mjr joints.  No obvious deformity  Abdomen: Soft.  Nondistended.  Mildly tender at incisions only.  No peritonitis.  No incarcerated hernias. Ileostomy right infraumbilical paramedian with some edema.  Viable.  Incisions clean dry intact.    GU: Clear light yellow urine in bag. Rectal:  deferred Ext:   No deformity.  No mjr edema.  No cyanosis Skin: No petechiae / purpurea.  No major sores.  Warm and dry    Results:   Cultures: Recent Results (from the past 720 hour(s))  MRSA Next Gen by PCR, Nasal     Status: None   Collection Time: 11/14/22  1:30 AM   Specimen: Nasal Mucosa; Nasal Swab  Result Value Ref Range Status   MRSA by PCR Next Gen NOT DETECTED NOT DETECTED Final    Comment: (NOTE) The GeneXpert MRSA Assay (FDA approved for NASAL specimens only), is one component of a comprehensive MRSA colonization surveillance program. It is not intended to diagnose MRSA infection nor to guide or monitor treatment for MRSA infections. Test performance is not FDA approved in  patients less than 28 years old. Performed at [facility], [place] [address], [place], [place] [address]     Labs: Results for orders placed or performed during the hospital encounter of 11/10/22 (from the past 48 hour(s))  Hemoglobin and hematocrit, blood     Status: Abnormal   Collection Time: 11/15/22  2:22 PM  Result Value Ref Range   Hemoglobin 8.3 (L) 13.0 - 17.0 g/dL   HCT 26.1 (L) 39.0 - 52.0 %    Comment: Performed at [facility], [place] [address], [place], [place] [address]  Potassium     Status: None   Collection Time: 11/16/22  3:00 AM  Result Value Ref Range   Potassium 3.8 3.5 - 5.1 mmol/L    Comment: Performed at [facility], [place] [address], [place], [place] [address]  Creatinine, serum     Status: None   Collection Time: 11/16/22  3:00 AM  Result Value Ref Range   Creatinine, Ser 0.81 0.61 - 1.24 mg/dL   GFR, Estimated >60 >60 mL/min    Comment: (NOTE) Calculated using the CKD-EPI Creatinine Equation (2021) Performed at [facility], [place] [address] [place], [place] [address]   CBC     Status: Abnormal   Collection Time: 11/16/22  3:00 AM  Result Value Ref Range   WBC 11.6 (H) 4.0 - 10.5 K/uL   RBC 2.74 (L) 4.22 - 5.81 MIL/uL   Hemoglobin 7.8 (L) 13.0 - 17.0 g/dL   HCT 24.6 (L) 39.0 - 52.0 %   MCV 89.8 80.0 - 100.0 fL   MCH 28.5 26.0 - 34.0 pg   MCHC 31.7 30.0 - 36.0 g/dL   RDW 14.3 11.5 - 15.5 %   Platelets 263 150 - 400 K/uL   nRBC 0.0 0.0 - 0.2 %    Comment: Performed at [facility], [place] [address], [place], [place] [address]  Potassium     Status: None   Collection Time: 11/17/22  5:01 AM  Result Value Ref Range   Potassium 3.8 3.5 - 5.1 mmol/L    Comment: Performed at [facility], [place] [address] [place], [place] [address]  Creatinine, serum     Status: None   Collection Time: 11/17/22  5:01 AM  Result Value Ref Range   Creatinine, Ser 0.82 0.61  - 1.24 mg/dL   GFR, Estimated >60 >60 mL/min    Comment: (NOTE) Calculated using the CKD-EPI Creatinine Equation (2021) Performed at [facility], [place] [address], [place], [place] [address]   CBC     Status: Abnormal   Collection Time: 11/17/22  5:01 AM  Result Value Ref Range  WBC 11.0 (H) 4.0 - 10.5 K/uL   RBC 2.87 (L) 4.22 - 5.81 MIL/uL   Hemoglobin 8.3 (L) 13.0 - 17.0 g/dL   HCT 24.9 (L) 39.0 - 52.0 %   MCV 86.8 80.0 - 100.0 fL   MCH 28.9 26.0 - 34.0 pg   MCHC 33.3 30.0 - 36.0 g/dL   RDW 13.8 11.5 - 15.5 %   Platelets 340 150 - 400 K/uL   nRBC 0.0 0.0 - 0.2 %    Comment: Performed at [facility], [place] [address], [place], [place] [address]  Glucose, capillary     Status: Abnormal   Collection Time: 11/17/22  7:12 AM  Result Value Ref Range   Glucose-Capillary 117 (H) 70 - 99 mg/dL    Comment: Glucose reference range applies only to samples taken after fasting for at least 8 hours.    Imaging / Studies: DG Abd Portable 1V  Result Date: 11/16/2022 CLINICAL DATA:  Nasogastric tube placement EXAM: PORTABLE ABDOMEN - 1 VIEW COMPARISON:  CT abdomen 11/13/2022 FINDINGS: The nasogastric tube tip is in the descending duodenum. If gastric positioning is desired, consider retracting 10 cm. Tubing projects over the lower abdomen. Small bowel loops are dilated up to 5.3 cm in diameter, with an abnormal but nonspecific appearance which could reflect ileus or obstruction. IMPRESSION: 1. Nasogastric tube tip is in the descending duodenum. If gastric positioning is desired, consider retracting 10 cm. 2. Abnormal but nonspecific bowel gas pattern with dilated small bowel loops up to 5.3 cm in diameter. Electronically Signed   By: Van Clines M.D.   On: 11/16/2022 19:05    Medications / Allergies: per chart  Antibiotics: Anti-infectives (From admission, onward)    Start     Dose/Rate Route Frequency Ordered Stop   11/13/22 2000  ceFEPIme  (MAXIPIME) 2 g in sodium chloride 0.9 % 100 mL IVPB  Status:  Discontinued       Note to Pharmacy: Pharmacy may adjust dosing strength, schedule, rate of infusion, etc as needed to optimize therapy   2 g 200 mL/hr over 30 Minutes Intravenous Every 8 hours 11/13/22 1913 11/13/22 1925   11/13/22 2000  metroNIDAZOLE (FLAGYL) IVPB 500 mg  Status:  Discontinued        500 mg 100 mL/hr over 60 Minutes Intravenous Every 6 hours 11/13/22 1913 11/13/22 1924   11/13/22 1800  ceFEPIme (MAXIPIME) 2 g in sodium chloride 0.9 % 100 mL IVPB        2 g 200 mL/hr over 30 Minutes Intravenous Every 8 hours 11/13/22 1619 11/19/22 1059   11/13/22 1800  metroNIDAZOLE (FLAGYL) IVPB 500 mg        500 mg 100 mL/hr over 60 Minutes Intravenous Every 12 hours 11/13/22 1619 11/19/22 1359   11/10/22 1430  clindamycin (CLEOCIN) IVPB 900 mg        900 mg 100 mL/hr over 30 Minutes Intravenous Every 8 hours 11/10/22 1331 11/10/22 1658   11/10/22 1400  neomycin (MYCIFRADIN) tablet 1,000 mg  Status:  Discontinued       See Hyperspace for full Linked Orders Report.   1,000 mg Oral 3 times per day 11/10/22 0637 11/10/22 0643   11/10/22 1400  metroNIDAZOLE (FLAGYL) tablet 1,000 mg  Status:  Discontinued       See Hyperspace for full Linked Orders Report.   1,000 mg Oral 3 times per day 11/10/22 0637 11/10/22 0643   11/10/22 0645  ertapenem (INVANZ) 1,000 mg in sodium chloride  0.9 % 100 mL IVPB  Status:  Discontinued        1 g 200 mL/hr over 30 Minutes Intravenous On call to O.R. 11/10/22 2979 11/10/22 1328         Note: Portions of this report may have been transcribed using voice recognition software. Every effort was made to ensure accuracy; however, inadvertent computerized transcription errors may be present.   Any transcriptional errors that result from this process are unintentional.    [name], MD, FACS, MASCRS Esophageal, Gastrointestinal & Colorectal Surgery Robotic and Minimally Invasive  Surgery  [place]. [address], [place], [place] [address] [phone] Fax [phone] Main  CONTACT INFORMATION:  Weekday (9AM-5PM): Call [facility] main office at [phone]  Weeknight (5PM-9AM) or Weekend/Holiday: Check [email or web address] (password " [email or web address]") for General Surgery [facility] coverage  (Please, do not use SecureChat as it is not reliable communication to reach operating surgeons for immediate patient care given surgeries/outpatient duties/clinic/cross-coverage/off post-call which would lead to a delay in care.  Epic staff messaging available for outptient concerns, but may not be answered for 48 hours or more).     11/17/2022  8:07 AM

## 2022-11-17 NOTE — Consult Note (Signed)
[place] Nurse ostomy follow up Patient up ambulating in hall upon my arrival with PT and wife at his side.   NG tube in place.  Patient assisted back to  bed.  Eyes closed, minimally participative.  Asks when he will start to feel better.   Stoma type/location: RLQ ileostomy Stomal assessment/size: pink and moist, pouch intact  2/3 full liquid effluent Peristomal assessment: pouch intact  not changed today.  Will empty with wife.   Treatment options for stomal/peristomal skin: barrier ring and 2 piece pouch Output liquid green effluent Ostomy pouching: 2pc.  Education provided:  We empty pouch. Wife able to perform with minimal cueing.  We discuss emptying while on toilet when patient is stronger and they agree to practice when he is up in room with assistance.  We empty 225 mL green liquid effluent. Wife able to clean lip of pouch and roll closed when finished. We discuss twice weekly pouch changes and showering.  Enrolled patient in [place] Start Discharge program: Yes Will not follow at this time.  Please re-consult if needed.  Estrellita Ludwig MSN, RN, FNP-BC CWON Wound, Ostomy, Continence Nurse [facility] [phone] Pager [phone]

## 2022-11-17 NOTE — Progress Notes (Signed)
Patient's wife verbalizes concerns  regarding the patient's NGT output color changing from bile green to red. On call provider Dr. [name] was paged with new orders of Protonix IV. We will continue to monitor.

## 2022-11-17 NOTE — Progress Notes (Signed)
Mobility Specialist - Progress Note   11/17/22 1346  Mobility  Activity Ambulated with assistance in hallway  Level of Assistance Standby assist, set-up cues, supervision of patient - no hands on  Assistive Device Front wheel walker  Distance Ambulated (ft) 230 ft  Activity Response Tolerated well  Mobility Referral Yes  $Mobility charge 1 Mobility   Pt received in recliner and agreeable to mobility. Pt ambulated w/ eyes closed due to not feeling well. Pt to bed after session with all needs met & MD in room.  [facility]

## 2022-11-18 LAB — CBC
HCT: 26.7 % — ABNORMAL LOW (ref 39.0–52.0)
Hemoglobin: 8.6 g/dL — ABNORMAL LOW (ref 13.0–17.0)
MCH: 28.9 pg (ref 26.0–34.0)
MCHC: 32.2 g/dL (ref 30.0–36.0)
MCV: 89.6 fL (ref 80.0–100.0)
Platelets: 386 10*3/uL (ref 150–400)
RBC: 2.98 MIL/uL — ABNORMAL LOW (ref 4.22–5.81)
RDW: 13.8 % (ref 11.5–15.5)
WBC: 11.3 10*3/uL — ABNORMAL HIGH (ref 4.0–10.5)
nRBC: 0.2 % (ref 0.0–0.2)

## 2022-11-18 LAB — CREATININE, SERUM
Creatinine, Ser: 0.71 mg/dL (ref 0.61–1.24)
GFR, Estimated: >60 mL/min (ref >60)

## 2022-11-18 LAB — GLUCOSE, CAPILLARY: Glucose-Capillary: 111 mg/dL — ABNORMAL HIGH (ref 70–99)

## 2022-11-18 LAB — POTASSIUM: Potassium: 3.7 mmol/L (ref 3.5–5.1)

## 2022-11-18 MED ORDER — LACTATED RINGERS IV BOLUS: 1000.0000 mL | Freq: Three times a day (TID) | INTRAVENOUS | Status: DC | PRN

## 2022-11-18 MED ORDER — LACTATED RINGERS IV SOLN: INTRAVENOUS | Status: DC

## 2022-11-18 MED ORDER — ONDANSETRON HCL 4 MG/2ML IJ SOLN
4.0000 mg | Freq: Three times a day (TID) | INTRAMUSCULAR | Status: DC
  Administered 2022-11-18 – 2022-11-19 (×2): 4 mg via INTRAVENOUS
  Filled 2022-11-18 (×2): qty 2

## 2022-11-18 NOTE — Consult Note (Signed)
[place] Nurse ostomy follow up Stoma type/location: RLQ ileostomy Stomal assessment/size: 1 and 5/8 inches round Peristomal assessment: not viewed today Treatment options for stomal/peristomal skin: skin barrier intact. Pouch applied Monday is intact. Output:250 mls emptied from pouch. Ostomy pouching: 2pc. 2 and 3/4 inch pouching system with skin barrier ring Education provided:  Significant other assisted with pouch emptying, is able to relay all steps. Amendable to pouch change tomorrow morning at 9am. Enrolled patient in [place] Start Discharge program: Yes   [place] nursing team will follow, and will remain available to this patient, the nursing and medical teams.   Thank you for inviting [place] to participate in this patient's Plan of Care.  Maudie Flakes, MSN, RN, CNS, [place], Serita Grammes, [employer], [employer] phone:  [phone]

## 2022-11-18 NOTE — Progress Notes (Signed)
Mobility Specialist - Progress Note   11/18/22 1500  Mobility  Activity Ambulated with assistance in hallway  Level of Assistance Modified independent, requires aide device or extra time  Assistive Device Front wheel walker  Distance Ambulated (ft) 500 ft  Activity Response Tolerated well  Mobility Referral Yes  $Mobility charge 1 Mobility   Pt received in bed and agreeable to mobility. No complaints during session. Pt to bed after session with all needs met.   [facility]

## 2022-11-18 NOTE — Progress Notes (Signed)
Patient was asked about how he feel about starting the clamping trial ,patient denied nausea and agreed to be clamped. Educated to notify if he feels nauseous and we'll be checking residual. We will continue to monitor.

## 2022-11-18 NOTE — Progress Notes (Signed)
Per Dr.Gross NG tube is to be hooked back up to LWS. This was completed at 0915 after patients second walk of the morning. Tube was flushed and restarted at 50.Pt requested ice chips and is resting in bed.

## 2022-11-18 NOTE — Progress Notes (Signed)
Carlos Stone [id number] September 03, 1976  CARE TEAM:  PCP: [name], MD  Outpatient Care Team: Patient Care Team: [name], MD as PCP - General (Family Medicine) [name], MD as Consulting Physician (General Surgery) [name], MD as Consulting Physician (Gastroenterology)  Inpatient Treatment Team: Treatment Team: Attending Provider: [name], MD; [place] Nurse: McNichol, Rudi Heap, RN; Mobility Specialist: Trevor Mace; Technician: Leda Quail, [place]; Registered Nurse: Charlyne Petrin, RN; Utilization Review: Lacretia Leigh, RN; Pharmacist: Lynelle Doctor, [facility]; Social Worker: Servando Snare, LCSW   Problem List:   Principal Problem:   Diverticulitis with abscess s/p sigmoid colectomy 11/10/2022 Active Problems:   Colonic diverticular abscess   Diverticulitis   5 Days Post-Op  11/10/2022  POST-OPERATIVE DIAGNOSIS:  SIGMOID DIVERTICULITIS WITH ABSCESS   PROCEDURE:   ROBOTIC LOW ANTERIOR RECTOSIGMOID RESECTION INTRAOPERATIVE ASSESSMENT OF PERFUSION USING FIREFLY TRANSVERSUS ABDOMINIS PLANE (TAP) BLOCK - BILATERAL RIGID PROCTOSCOPY   SURGEON:  [name], MD   OR FINDINGS:   Patient had inflamed mid distal sigmoid colon with abscess between sigmoid colon and dome of the bladder.  No strong evidence of fistula or cystitis.   No obvious metastatic disease on visceral parietal peritoneum or liver.   The anastomosis rests 12 cm from the anal verge by rigid proctoscopy.   It is a descending colon to proximal rectal 29 EEA stapled anastomosis   CASE DATA:   Type of patient?: Elective [facility] Private Case Status of Case? Elective Scheduled Infection Present At Time Of Surgery (PATOS)?  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    PROCEDURE:   Diagnostic laparoscopy. Washout and drainage of intra-abdominal abscesses x 2. Diverting loop ileostomy.   SURGEON:  [name], MD   ASSISTANT: [name], MD  OR FINDINGS:    Patient had moderate peritonitis with evidence of some feculent peritonitis.  Interloop infraumbilical retroperitoneal abscess is walled off by small bowel that tract to the right anterior colorectal anastomosis - epicenter of inflammation and phlegmon suspicious for microperforation there.  No evidence of any major ischemia or necrosis of the colon nor rectum.  Inflammatory adhesions causing partial small bowel obstruction released.  No evidence of any small bowel perforation or injury nor gastroduodenal ulceration or perforation.  Extensive washout done.  Diverting loop ileostomy done.   Difficult nasogastric tube intubation ultimately successful with placing Ewall tube and dilating to pass 18 French nasogastric tube to decompress stomach with fecalization  Pathology consistent with diverticulitis.     Assessment  Fair w ileus  [facility] Stay = 8 days)  Plan:  Increased ileostomy output with the erythromycin guardedly hopeful sign.  However still had high output of the nasogastric tube.  (1792m in 24 hours = too much).  NG tube erroneously clamped despite orders.  Discussed with nursing to clarify verbally to follow orders in chart and keep on low intermittent wall suction.  Needs to stay on suction for now.  If NGT output is less  persistent good ileostomy output and less nausea, can consider clamping trials tomorrow.  Drop in hemoglobin.  Transfused 2 units.  Gave IV iron.  Held anticoagulation x 48 hours.  Hgb stable x48 hours = restart enoxaparin 1/17.  Follow.  Transfuse as needed.    Do not adjust the NG tube - took 30 min to get an NGT to get placed  correctly intraop.  Continued scheduled ondansetron erythromycin for now.  TPN if not better by 1/19  Mild hematochezia most  likely NG tube irritation not present now.  Agree with IV PPI for now.  Follow.  Hemoglobin stable.  Ileostomy care.- WOCN have seen.  Once NG tube out and starting p.o., do ileostomy regimen usually of fiber/iron/Imodium twice daily and adjust.  Hartmann resection if worse.  Hopefully not likely.  IV antibiotics x 5 days postop minimal.  Doing cefepime and metronidazole given his allergies to numerous other medications.  Low threshold to repeat CAT scan 1/19 if not better to rule out delayed abscess formation.  Keep [facility] surgical drain for several weeks.  Most likely need follow-up and enema & drain contrast study in a few weeks to make sure that delayed leak has closed down.  Try and get up and mobilize.  Up in a chair.  Followed by physical therapy yesterday.  They feel encouraged to use improving.  Patient more motivated to walk the last 24 hours a guardedly hopeful sign as well.  Ordered rolling walker for balance and support.    Insomnia.  Reinforced with patient nursing can use IV Benadryl and lorazepam as needed for now until reliably has enteral function to take oral meds  If he can get through all this safely will plan loop ileostomy takedown in 3 months as long as leak has resolved.  If not closed by 6 months may need to revise the anastomosis.  Hopefully not too likely.  We will see.  I updated the patient's status to the patient and spouse & ICU RN.  Recommendations were made.  Questions were answered.  They expressed understanding & appreciation.   Disposition: TBD       I reviewed nursing notes, last 24 h vitals and pain scores, last 48 h intake and output, last 24 h labs and trends, and last 24 h imaging results. I have reviewed this patient's available data, including medical history, events of note, test results, etc as part of my evaluation.  A significant portion of that time was spent in counseling.  Care during the described time interval was provided by me.  This  care required moderate level of medical decision making.  11/18/2022    Subjective: (Chief complaint)  More output from ileostomy.  Still a fair amount of the NG tube.  Nursing clamp tube last night.  Patient walking better.  Felt sedated/agitated on the metoclopramide.  Ativan helped.  Having issues sleeping.  Objective:  Vital signs:  Vitals:   11/17/22 0833 11/17/22 1335 11/17/22 2131 11/18/22 0648  BP: 117/75 137/79 126/81 135/87  Pulse: 65 67 66 66  Resp: '16 16 18 18  '$ Temp: 98.4 F (36.9 C)  98.7 F (37.1 C) 98.6 F (37 C)  TempSrc: Oral  Oral Oral  SpO2: 98% 97% 96% 97%  Weight:      Height:        Last BM Date : 11/18/22  Intake/Output   Yesterday:  01/17 0701 - 01/18 0700 In: 1385.6 [P.O.:30; I.V.:773.3; IV Piggyback:577.2] Out: 6644 [Urine:1100; Emesis/NG output:1700; Drains:65; Stool:2250] This shift:  Total I/O In: 90 [P.O.:90] Out: 420 [Urine:250; Drains:20; Stool:150]  Bowel function:  Flatus: YES  BM:  YES -thin liquids bilious output  Drain (19 Fr Blake drain in pelvis) : Serosanguinous NGT: Dark brown bilious effluent.  Slightly thinner than yesterday but still moderately syrupy  Physical Exam:  General: Pt awake/alert in mild acute distress.  Tired/exhausted  Eyes: PERRL, normal EOM.  Sclera clear.  No icterus Neuro: CN II-XII intact w/o focal sensory/motor deficits. Lymph: No head/neck/groin lymphadenopathy Psych:  No delerium/psychosis/paranoia.  Oriented x 4.  Mildly anxious and depressed but consolable. HENT: Normocephalic, Mucus membranes moist.  No thrush Neck: Supple, No tracheal deviation.  No obvious thyromegaly Chest: No pain to chest wall compression.  Good respiratory excursion.  No audible wheezing CV:  Pulses intact.  Regular rhythm.  No major extremity edema MS: Normal AROM mjr joints.  No obvious deformity  Abdomen: Soft.  Nondistended.  Mildly tender at incisions only.  No peritonitis.  No incarcerated  hernias. Ileostomy right infraumbilical paramedian with some edema.  Viable.  Incisions clean dry intact.    GU: Clear light yellow urine in bag. Rectal: deferred Ext:   No deformity.  No mjr edema.  No cyanosis Skin: No petechiae / purpurea.  No major sores.  Warm and dry    Results:   Cultures: Recent Results (from the past 720 hour(s))  MRSA Next Gen by PCR, Nasal     Status: None   Collection Time: 11/14/22  1:30 AM   Specimen: Nasal Mucosa; Nasal Swab  Result Value Ref Range Status   MRSA by PCR Next Gen NOT DETECTED NOT DETECTED Final    Comment: (NOTE) The GeneXpert MRSA Assay (FDA approved for NASAL specimens only), is one component of a comprehensive MRSA colonization surveillance program. It is not intended to diagnose MRSA infection nor to guide or monitor treatment for MRSA infections. Test performance is not FDA approved in patients less than 50 years old. Performed at [facility], [place] [address], [place], [place] [address]     Labs: Results for orders placed or performed during the hospital encounter of 11/10/22 (from the past 48 hour(s))  Potassium     Status: None   Collection Time: 11/17/22  5:01 AM  Result Value Ref Range   Potassium 3.8 3.5 - 5.1 mmol/L    Comment: Performed at [facility], [place] [address] [place], [place] [address]  Creatinine, serum     Status: None   Collection Time: 11/17/22  5:01 AM  Result Value Ref Range   Creatinine, Ser 0.82 0.61 - 1.24 mg/dL   GFR, Estimated >60 >60 mL/min    Comment: (NOTE) Calculated using the CKD-EPI Creatinine Equation (2021) Performed at [facility], [place] [address] [place], [place] [address]   CBC     Status: Abnormal   Collection Time: 11/17/22  5:01 AM  Result Value Ref Range   WBC 11.0 (H) 4.0 - 10.5 K/uL   RBC 2.87 (L) 4.22 - 5.81 MIL/uL   Hemoglobin 8.3 (L) 13.0 - 17.0 g/dL   HCT 24.9 (L) 39.0 - 52.0 %   MCV 86.8 80.0 - 100.0 fL   MCH 28.9  26.0 - 34.0 pg   MCHC 33.3 30.0 - 36.0 g/dL   RDW 13.8 11.5 - 15.5 %   Platelets 340 150 - 400 K/uL   nRBC 0.0 0.0 - 0.2 %    Comment: Performed at [facility], [place] [address] [place], [place] [address]  Glucose, capillary     Status: Abnormal   Collection Time: 11/17/22  7:12 AM  Result Value Ref Range   Glucose-Capillary 117 (H) 70 - 99 mg/dL    Comment: Glucose reference range applies only to samples taken after fasting for at least 8 hours.  Glucose, capillary  Status: Abnormal   Collection Time: 11/17/22 11:38 AM  Result Value Ref Range   Glucose-Capillary 111 (H) 70 - 99 mg/dL    Comment: Glucose reference range applies only to samples taken after fasting for at least 8 hours.  Glucose, capillary     Status: Abnormal   Collection Time: 11/17/22  4:28 PM  Result Value Ref Range   Glucose-Capillary 118 (H) 70 - 99 mg/dL    Comment: Glucose reference range applies only to samples taken after fasting for at least 8 hours.  Glucose, capillary     Status: Abnormal   Collection Time: 11/17/22  7:47 PM  Result Value Ref Range   Glucose-Capillary 125 (H) 70 - 99 mg/dL    Comment: Glucose reference range applies only to samples taken after fasting for at least 8 hours.  Glucose, capillary     Status: Abnormal   Collection Time: 11/17/22 11:54 PM  Result Value Ref Range   Glucose-Capillary 127 (H) 70 - 99 mg/dL    Comment: Glucose reference range applies only to samples taken after fasting for at least 8 hours.  Glucose, capillary     Status: Abnormal   Collection Time: 11/18/22  3:54 AM  Result Value Ref Range   Glucose-Capillary 111 (H) 70 - 99 mg/dL    Comment: Glucose reference range applies only to samples taken after fasting for at least 8 hours.  CBC     Status: Abnormal   Collection Time: 11/18/22  4:45 AM  Result Value Ref Range   WBC 11.3 (H) 4.0 - 10.5 K/uL   RBC 2.98 (L) 4.22 - 5.81 MIL/uL   Hemoglobin 8.6 (L) 13.0 - 17.0 g/dL   HCT 26.7 (L)  39.0 - 52.0 %   MCV 89.6 80.0 - 100.0 fL   MCH 28.9 26.0 - 34.0 pg   MCHC 32.2 30.0 - 36.0 g/dL   RDW 13.8 11.5 - 15.5 %   Platelets 386 150 - 400 K/uL   nRBC 0.2 0.0 - 0.2 %    Comment: Performed at [facility], [place] [address], [place], [place] [address]  Potassium     Status: None   Collection Time: 11/18/22  4:45 AM  Result Value Ref Range   Potassium 3.7 3.5 - 5.1 mmol/L    Comment: Performed at [facility], [place] [address], [place], [place] [address]  Creatinine, serum     Status: None   Collection Time: 11/18/22  4:45 AM  Result Value Ref Range   Creatinine, Ser 0.71 0.61 - 1.24 mg/dL   GFR, Estimated >60 >60 mL/min    Comment: (NOTE) Calculated using the CKD-EPI Creatinine Equation (2021) Performed at [facility], [place] [address], [place], [place] [address]     Imaging / Studies: DG Abd Portable 1V  Result Date: 11/16/2022 CLINICAL DATA:  Nasogastric tube placement EXAM: PORTABLE ABDOMEN - 1 VIEW COMPARISON:  CT abdomen 11/13/2022 FINDINGS: The nasogastric tube tip is in the descending duodenum. If gastric positioning is desired, consider retracting 10 cm. Tubing projects over the lower abdomen. Small bowel loops are dilated up to 5.3 cm in diameter, with an abnormal but nonspecific appearance which could reflect ileus or obstruction. IMPRESSION: 1. Nasogastric tube tip is in the descending duodenum. If gastric positioning is desired, consider retracting 10 cm. 2. Abnormal but nonspecific bowel gas pattern with dilated small bowel loops up to 5.3 cm in diameter. Electronically Signed   By: Cindra Eves.D.  On: 11/16/2022 19:05    Medications / Allergies: per chart  Antibiotics: Anti-infectives (From admission, onward)    Start     Dose/Rate Route Frequency Ordered Stop   11/17/22 1200  erythromycin 250 mg in sodium chloride 0.9 % 100 mL IVPB        250 mg 100 mL/hr over 60 Minutes Intravenous Every 8 hours  11/17/22 0828 11/19/22 1359   11/13/22 2000  ceFEPIme (MAXIPIME) 2 g in sodium chloride 0.9 % 100 mL IVPB  Status:  Discontinued       Note to Pharmacy: Pharmacy may adjust dosing strength, schedule, rate of infusion, etc as needed to optimize therapy   2 g 200 mL/hr over 30 Minutes Intravenous Every 8 hours 11/13/22 1913 11/13/22 1925   11/13/22 2000  metroNIDAZOLE (FLAGYL) IVPB 500 mg  Status:  Discontinued        500 mg 100 mL/hr over 60 Minutes Intravenous Every 6 hours 11/13/22 1913 11/13/22 1924   11/13/22 1800  ceFEPIme (MAXIPIME) 2 g in sodium chloride 0.9 % 100 mL IVPB        2 g 200 mL/hr over 30 Minutes Intravenous Every 8 hours 11/13/22 1619 11/19/22 1059   11/13/22 1800  metroNIDAZOLE (FLAGYL) IVPB 500 mg        500 mg 100 mL/hr over 60 Minutes Intravenous Every 12 hours 11/13/22 1619 11/19/22 1359   11/10/22 1430  clindamycin (CLEOCIN) IVPB 900 mg        900 mg 100 mL/hr over 30 Minutes Intravenous Every 8 hours 11/10/22 1331 11/10/22 1658   11/10/22 1400  neomycin (MYCIFRADIN) tablet 1,000 mg  Status:  Discontinued       See Hyperspace for full Linked Orders Report.   1,000 mg Oral 3 times per day 11/10/22 0637 11/10/22 0643   11/10/22 1400  metroNIDAZOLE (FLAGYL) tablet 1,000 mg  Status:  Discontinued       See Hyperspace for full Linked Orders Report.   1,000 mg Oral 3 times per day 11/10/22 0637 11/10/22 0643   11/10/22 0645  ertapenem (INVANZ) 1,000 mg in sodium chloride 0.9 % 100 mL IVPB  Status:  Discontinued        1 g 200 mL/hr over 30 Minutes Intravenous On call to O.R. 11/10/22 0076 11/10/22 1328         Note: Portions of this report may have been transcribed using voice recognition software. Every effort was made to ensure accuracy; however, inadvertent computerized transcription errors may be present.   Any transcriptional errors that result from this process are unintentional.    [name], MD, FACS, MASCRS Esophageal, Gastrointestinal &  Colorectal Surgery Robotic and Minimally Invasive Surgery  [place]. [address] [place],  [address] [phone] Fax [phone] Main  CONTACT INFORMATION:  Weekday (9AM-5PM): Call [facility] main office at [phone]  Weeknight (5PM-9AM) or Weekend/Holiday: Check [email or web address] (password " [email or web address]") for General Surgery [facility] coverage  (Please, do not use SecureChat as it is not reliable communication to reach operating surgeons for immediate patient care given surgeries/outpatient duties/clinic/cross-coverage/off post-call which would lead to a delay in care.  Epic staff messaging available for outptient concerns, but may not be answered for 48 hours or more).     11/18/2022  10:51 AM

## 2022-11-19 LAB — CBC
HCT: 27.9 % — ABNORMAL LOW (ref 39.0–52.0)
Hemoglobin: 8.8 g/dL — ABNORMAL LOW (ref 13.0–17.0)
MCH: 28.3 pg (ref 26.0–34.0)
MCHC: 31.5 g/dL (ref 30.0–36.0)
MCV: 89.7 fL (ref 80.0–100.0)
Platelets: 427 10*3/uL — ABNORMAL HIGH (ref 150–400)
RBC: 3.11 MIL/uL — ABNORMAL LOW (ref 4.22–5.81)
RDW: 13.8 % (ref 11.5–15.5)
WBC: 14.2 10*3/uL — ABNORMAL HIGH (ref 4.0–10.5)
nRBC: 0.2 % (ref 0.0–0.2)

## 2022-11-19 LAB — CREATININE, SERUM
Creatinine, Ser: 0.72 mg/dL (ref 0.61–1.24)
GFR, Estimated: >60 mL/min (ref >60)

## 2022-11-19 LAB — POTASSIUM: Potassium: 3.6 mmol/L (ref 3.5–5.1)

## 2022-11-19 MED ORDER — CALCIUM POLYCARBOPHIL 625 MG PO TABS
625.0000 mg | ORAL_TABLET | Freq: Two times a day (BID) | ORAL | Status: DC
  Administered 2022-11-19 – 2022-11-22 (×6): 625 mg via ORAL
  Filled 2022-11-19 (×6): qty 1

## 2022-11-19 MED ORDER — LOPERAMIDE HCL 2 MG PO CAPS: 2.0000 mg | ORAL_CAPSULE | Freq: Four times a day (QID) | ORAL | Status: DC | PRN

## 2022-11-19 MED ORDER — PANTOPRAZOLE 80MG IVPB - SIMPLE MED
80.0000 mg | Freq: Every day | INTRAVENOUS | Status: DC
  Administered 2022-11-19 – 2022-11-21 (×3): 80 mg via INTRAVENOUS
  Filled 2022-11-19 (×3): qty 80

## 2022-11-19 MED ORDER — ONDANSETRON 8 MG/NS 50 ML IVPB
8.0000 mg | Freq: Four times a day (QID) | INTRAVENOUS | Status: DC | PRN
  Administered 2022-11-19 – 2022-11-21 (×4): 8 mg via INTRAVENOUS
  Filled 2022-11-19 (×2): qty 54
  Filled 2022-11-19 (×2): qty 8

## 2022-11-19 MED ORDER — MAGIC MOUTHWASH: 10.0000 mL | Freq: Four times a day (QID) | ORAL | Status: AC | PRN

## 2022-11-19 MED ORDER — FERROUS SULFATE 325 (65 FE) MG PO TABS
325.0000 mg | ORAL_TABLET | Freq: Two times a day (BID) | ORAL | Status: DC
  Administered 2022-11-19 – 2022-11-22 (×6): 325 mg via ORAL
  Filled 2022-11-19 (×7): qty 1

## 2022-11-19 MED ORDER — ESCITALOPRAM OXALATE 20 MG PO TABS
20.0000 mg | ORAL_TABLET | Freq: Every day | ORAL | Status: DC
  Administered 2022-11-19 – 2022-11-22 (×4): 20 mg via ORAL
  Filled 2022-11-19 (×4): qty 1

## 2022-11-19 NOTE — Consult Note (Signed)
[place] Nurse ostomy consult note Stoma type/location: RLQ ileostomy Stomal assessment/size: Slightly less than 1 and 5/8 inches round, slightly raised, red, lumen in center. Expect stoma to measure 1 and 1/2 inch by next pouch change. Peristomal assessment: intact, clear Treatment options for stomal/peristomal skin: skin barrier ring Output: thin green effluent, 412ms emptied from pouch. Pouch was over-filled. Patient had had first PO this morning (apple juice) and NGT is clamped. Ostomy pouching: 2pc. 2 and 1/4 inch pouching supplies with skin barrier ring. Pouch is LKellie Simmering# [id number], skin barrier is LKellie Simmering# [id number] and skin barrier ring is LKellie Simmering# [id number] Education provided:  Explained role of ostomy nurse and creation of stoma  Explained stoma characteristics (budded, flush, color, texture, care) Demonstrated pouch change (cutting new skin barrier, measuring stoma, cleaning peristomal skin and stoma, use of barrier ring) Education on emptying when 1/3 to 1/2 full and how to empty. Instructed to epty when 1/3 to 1/2 full and to place toilet paper in toilet when emptying so that splash does not occur. Demonstrated use of wick to clean spout  Discussed bathing, diet, gas, medication use, constipation, diarrhea, dehydration   Answered patient/family questions about obtaining supplies post discharge. Secure Start role in associating the patient and family with a supplier explained.  Supplies in room: 5 skin barriers, 5 pouches, 5 rings. Discussed with Bedside RN and [facility] P. Armstrong that patient's SO needed to become independent in emptying by end of weekend.   Enrolled patient in HTiburonesD/C program: Yes  WTarboronursing team will follow, and will remain available to this patient, the nursing and medical teams.    Thank you for inviting [place]ato participate in this patient's Plan of Care.  LMaudie Flakes MSN, RN, CNS, [place] CSerita Grammes [employer] F[employer] Corporationphone:  [phone]

## 2022-11-19 NOTE — Progress Notes (Signed)
Physical Therapy Treatment Patient Details Name: Carlos Stone MRN: [id number] DOB: 1976/07/30 Today's Date: 11/19/2022   History of Present Illness 47 y.o. 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 PMH: anxiety, meningitis.    PT Comments    Pt is progressing well with mobility, he tolerated an increased ambulation distance of 450' with RW, no nausea, no loss of balance. He is mobilizing well at a modified independent level. PT signing off as pt is able to ambulate with his significant other and with mobility team.     Recommendations for follow up therapy are one component of a multi-disciplinary discharge planning process, led by the attending physician.  Recommendations may be updated based on patient status, additional functional criteria and insurance authorization.  Follow Up Recommendations  No PT follow up     Assistance Recommended at Discharge Set up Supervision/Assistance  Patient can return home with the following A little help with bathing/dressing/bathroom;Assistance with cooking/housework;Assist for transportation   Equipment Recommendations  Rolling walker (2 wheels)    Recommendations for Other Services       Precautions / Restrictions Precautions Precautions: Other (comment) Precaution Comments: abdominal surgery; NG tube, ostomy and JP Restrictions Weight Bearing Restrictions: No     Mobility  Bed Mobility Overal bed mobility: Needs Assistance Bed Mobility: Sit to Supine       Sit to supine: Supervision   General bed mobility comments: VCs for hand placement    Transfers Overall transfer level: Needs assistance Equipment used: Rolling walker (2 wheels) Transfers: Sit to/from Stand Sit to Stand: Supervision           General transfer comment: VCs hand placement    Ambulation/Gait Ambulation/Gait assistance:  Modified independent (Device/Increase time) Gait Distance (Feet): 450 Feet Assistive device: Rolling walker (2 wheels) Gait Pattern/deviations: Decreased stride length Gait velocity: decr     General Gait Details: no nausea, no dizziness, no loss of balance   Stairs             Wheelchair Mobility    Modified Rankin (Stroke Patients Only)       Balance Overall balance assessment: Needs assistance   Sitting balance-Leahy Scale: Good       Standing balance-Leahy Scale: Fair                              Cognition Arousal/Alertness: Awake/alert Behavior During Therapy: WFL for tasks assessed/performed Overall Cognitive Status: Within Functional Limits for tasks assessed                                          Exercises      General Comments        Pertinent Vitals/Pain Pain Assessment Pain Assessment: No/denies pain    Home Living                          Prior Function            PT Goals (current goals can now be found in the care plan section) Acute Rehab PT Goals Patient Stated Goal: likes to watch tv PT Goal Formulation: With patient/family Time For Goal Achievement: 12/01/22 Potential to Achieve Goals: Good Progress towards PT goals: Goals met/education completed, patient discharged  from PT    Frequency    Min 3X/week      PT Plan Current plan remains appropriate    Co-evaluation              AM-PAC PT "6 Clicks" Mobility   Outcome Measure  Help needed turning from your back to your side while in a flat bed without using bedrails?: None Help needed moving from lying on your back to sitting on the side of a flat bed without using bedrails?: None Help needed moving to and from a bed to a chair (including a wheelchair)?: None Help needed standing up from a chair using your arms (e.g., wheelchair or bedside chair)?: None Help needed to walk in hospital room?: None Help needed climbing  3-5 steps with a railing? : A Little 6 Click Score: 23    End of Session   Activity Tolerance: Patient tolerated treatment well Patient left: in bed;with call bell/phone within reach;with family/visitor present Nurse Communication: Mobility status PT Visit Diagnosis: Difficulty in walking, not elsewhere classified (R26.2)     Time: 1250-1303 PT Time Calculation (min) (ACUTE ONLY): 13 min  Charges:  $Gait Training: 8-22 mins                     Blondell Reveal Kistler PT 11/19/2022  Acute Rehabilitation Services  Office [phone]

## 2022-11-19 NOTE — Progress Notes (Addendum)
Dr Johney Maine called to check on pt's NGT clamping trial. Doing great with <50 ml after first 6 hours trial reported to MD. See new orders received via phone from Dr Johney Maine.

## 2022-11-19 NOTE — Progress Notes (Signed)
Carlos Stone [id number] 05/30/1976  CARE TEAM:  PCP: [name], MD  Outpatient Care Team: Patient Care Team: [name], MD as PCP - General (Family Medicine) [name], MD as Consulting Physician (General Surgery) [name], MD as Consulting Physician (Gastroenterology)  Inpatient Treatment Team: Treatment Team: Attending Provider: [name], MD; [place] Nurse: McNichol, Rudi Heap, RN; Mobility Specialist: Trevor Mace; Registered Nurse: Chucky May, RN; Utilization Review: Lacretia Leigh, RN; Registered Nurse: Tanda Rockers, RN   Problem List:   Principal Problem:   Diverticulitis with abscess s/p sigmoid colectomy 11/10/2022 Active Problems:   Colonic diverticular abscess   Diverticulitis   6 Days Post-Op  11/10/2022  POST-OPERATIVE DIAGNOSIS:  SIGMOID DIVERTICULITIS WITH ABSCESS   PROCEDURE:   ROBOTIC LOW ANTERIOR RECTOSIGMOID RESECTION INTRAOPERATIVE ASSESSMENT OF PERFUSION USING FIREFLY TRANSVERSUS ABDOMINIS PLANE (TAP) BLOCK - BILATERAL RIGID PROCTOSCOPY   SURGEON:  [name], MD   OR FINDINGS:   Patient had inflamed mid distal sigmoid colon with abscess between sigmoid colon and dome of the bladder.  No strong evidence of fistula or cystitis.   No obvious metastatic disease on visceral parietal peritoneum or liver.   The anastomosis rests 12 cm from the anal verge by rigid proctoscopy.   It is a descending colon to proximal rectal 29 EEA stapled anastomosis   CASE DATA:   Type of patient?: Elective [facility] Private Case Status of Case? Elective Scheduled Infection Present At Time Of Surgery (PATOS)?  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   PROCEDURE:   Diagnostic laparoscopy. Washout and drainage of  intra-abdominal abscesses x 2. Diverting loop ileostomy.   SURGEON:  [name], MD   ASSISTANT: [name], MD  OR FINDINGS:    Patient had moderate peritonitis with evidence of some feculent peritonitis.  Interloop infraumbilical retroperitoneal abscess is walled off by small bowel that tract to the right anterior colorectal anastomosis - epicenter of inflammation and phlegmon suspicious for microperforation there.  No evidence of any major ischemia or necrosis of the colon nor rectum.  Inflammatory adhesions causing partial small bowel obstruction released.  No evidence of any small bowel perforation or injury nor gastroduodenal ulceration or perforation.  Extensive washout done.  Diverting loop ileostomy done.   Difficult nasogastric tube intubation ultimately successful with placing Ewall tube and dilating to pass 18 French nasogastric tube to decompress stomach with fecalization  Pathology consistent with diverticulitis.     Assessment  Ileus slowly resolving [facility] Stay = 9 days)  Plan:  Output nasogastric tube markedly tapered off.  Nausea and cramping less.  Stop scheduled nausea medicine & switch to PRN  Will clamp NG tube and try liquids.  If he can tolerate liquids well for the next 6 hours with low residual, BP indicated advance to dysphagia 1 with diet.  Drop in hemoglobin.  Transfused 2 units.  Gave IV iron.  Held anticoagulation x 48 hours.  Hgb stable x48 hours = restart enoxaparin 1/17.  Follow.  Transfuse as needed.    Do not adjust the NG tube - took 30 min to get an NGT to get placed correctly intraop.   TPN if not better by 1/19  Mild hematochezia most likely NG tube irritation not present now.  Agree with IV PPI for now.  Follow.  Hemoglobin stable.  Ileostomy care.- WOCN have  seen.  Once NG tube out and tolerating PO, begin ileostomy regimen usually of fiber/iron/Imodium twice daily and adjust.  Hartmann resection if worse.  Hopefully not  likely.  IV antibiotics x 5 days postop = finish today 1/19.  Doing cefepime and metronidazole given his allergies to numerous other medications.  Stop & follow.   Low threshold to repeat CAT scan 1/19 if not better to rule out delayed abscess formation.  Keep [facility] surgical drain for several weeks.  Most likely need follow-up and enema & drain contrast study in a few weeks to make sure that delayed leak has closed down.  Try and get up and mobilize.  Up in a chair.  Followed by physical therapy yesterday.  They feel encouraged to use improving.  Patient more motivated to walk the last 24 hours a guardedly hopeful sign as well.  Ordered rolling walker for balance and support.    Insomnia.  Reinforced with patient nursing can use IV Benadryl and lorazepam as needed for now until reliably has enteral function to take oral meds  If he can get through all this safely will plan loop ileostomy takedown in 3 months as long as leak has resolved.  If not closed by 6 months may need to revise the anastomosis.  Hopefully not too likely.  We will see.  I updated the patient's status to the patient and spouse & ICU RN.  Recommendations were made.  Questions were answered.  They expressed understanding & appreciation.   Disposition: TBD       I reviewed nursing notes, last 24 h vitals and pain scores, last 48 h intake and output, last 24 h labs and trends, and last 24 h imaging results. I have reviewed this patient's available data, including medical history, events of note, test results, etc as part of my evaluation.  A significant portion of that time was spent in counseling.  Care during the described time interval was provided by me.  This care required moderate level of medical decision making.  11/19/2022    Subjective: (Chief complaint)  Less nausea Walking more Tired Wife in room  Objective:  Vital signs:  Vitals:   11/18/22 0648 11/18/22 1404 11/18/22 2015 11/19/22 0623  BP:  135/87 115/79 131/78 116/74  Pulse: 66 61 64 70  Resp: '18 18 18 16  '$ Temp: 98.6 F (37 C) 98.6 F (37 C) 98.4 F (36.9 C) 97.9 F (36.6 C)  TempSrc: Oral Oral Oral Oral  SpO2: 97% 97% 94% 97%  Weight:      Height:        Last BM Date : 11/15/22  Intake/Output   Yesterday:  01/18 0701 - 01/19 0700 In: 3196.7 [P.O.:180; I.V.:1654.9; IV Piggyback:1361.8] Out: 2410 [Urine:1120; Emesis/NG output:50; Drains:40; Stool:1200] This shift:  Total I/O In: 1343.4 [P.O.:30; I.V.:837.4; IV Piggyback:476] Out: 0109 [Urine:550; Emesis/NG output:50; Drains:10; Stool:850]  Bowel function:  Flatus: YES  BM:  YES -thin liquids bilious output  Drain (19 Fr Blake drain in pelvis) : Serosanguinous NGT: Dark brown bilious effluent.  Bilious syrupy  Physical Exam:  General: Pt awake/alert in mild acute distress.  Tired not not toxic/sickly Eyes: PERRL, normal EOM.  Sclera clear.  No icterus Neuro: CN II-XII intact w/o focal sensory/motor deficits. Lymph: No head/neck/groin lymphadenopathy Psych:  No delerium/psychosis/paranoia.  Oriented x 4.  Mildly anxious and depressed but consolable. HENT: Normocephalic, Mucus membranes moist.  No thrush Neck: Supple, No tracheal deviation.  No obvious thyromegaly Chest: No pain to chest  wall compression.  Good respiratory excursion.  No audible wheezing CV:  Pulses intact.  Regular rhythm.  No major extremity edema MS: Normal AROM mjr joints.  No obvious deformity  Abdomen: Soft.  Nondistended.  Nontender.  Incisions c/d/I.  No peritonitis.  No incarcerated hernias. Ileostomy right infraumbilical paramedian with some edema.  Viable.  Incisions clean dry intact.    GU:No foley Rectal: deferred Ext:   No deformity.  No mjr edema.  No cyanosis Skin: No petechiae / purpurea.  No major sores.  Warm and dry    Results:   Cultures: Recent Results (from the past 720 hour(s))  MRSA Next Gen by PCR, Nasal     Status: None   Collection Time: 11/14/22   1:30 AM   Specimen: Nasal Mucosa; Nasal Swab  Result Value Ref Range Status   MRSA by PCR Next Gen NOT DETECTED NOT DETECTED Final    Comment: (NOTE) The GeneXpert MRSA Assay (FDA approved for NASAL specimens only), is one component of a comprehensive MRSA colonization surveillance program. It is not intended to diagnose MRSA infection nor to guide or monitor treatment for MRSA infections. Test performance is not FDA approved in patients less than 39 years old. Performed at [facility], [place] [address] [place], [place] [address]     Labs: Results for orders placed or performed during the hospital encounter of 11/10/22 (from the past 48 hour(s))  Glucose, capillary     Status: Abnormal   Collection Time: 11/17/22  7:12 AM  Result Value Ref Range   Glucose-Capillary 117 (H) 70 - 99 mg/dL    Comment: Glucose reference range applies only to samples taken after fasting for at least 8 hours.  Glucose, capillary     Status: Abnormal   Collection Time: 11/17/22 11:38 AM  Result Value Ref Range   Glucose-Capillary 111 (H) 70 - 99 mg/dL    Comment: Glucose reference range applies only to samples taken after fasting for at least 8 hours.  Glucose, capillary     Status: Abnormal   Collection Time: 11/17/22  4:28 PM  Result Value Ref Range   Glucose-Capillary 118 (H) 70 - 99 mg/dL    Comment: Glucose reference range applies only to samples taken after fasting for at least 8 hours.  Glucose, capillary     Status: Abnormal   Collection Time: 11/17/22  7:47 PM  Result Value Ref Range   Glucose-Capillary 125 (H) 70 - 99 mg/dL    Comment: Glucose reference range applies only to samples taken after fasting for at least 8 hours.  Glucose, capillary     Status: Abnormal   Collection Time: 11/17/22 11:54 PM  Result Value Ref Range   Glucose-Capillary 127 (H) 70 - 99 mg/dL    Comment: Glucose reference range applies only to samples taken after fasting for at least 8 hours.  Glucose,  capillary     Status: Abnormal   Collection Time: 11/18/22  3:54 AM  Result Value Ref Range   Glucose-Capillary 111 (H) 70 - 99 mg/dL    Comment: Glucose reference range applies only to samples taken after fasting for at least 8 hours.  CBC     Status: Abnormal   Collection Time: 11/18/22  4:45 AM  Result Value Ref Range   WBC 11.3 (H) 4.0 - 10.5 K/uL   RBC 2.98 (L) 4.22 - 5.81 MIL/uL   Hemoglobin 8.6 (L) 13.0 - 17.0 g/dL   HCT 26.7 (L) 39.0 - 52.0 %  MCV 89.6 80.0 - 100.0 fL   MCH 28.9 26.0 - 34.0 pg   MCHC 32.2 30.0 - 36.0 g/dL   RDW 13.8 11.5 - 15.5 %   Platelets 386 150 - 400 K/uL   nRBC 0.2 0.0 - 0.2 %    Comment: Performed at [facility], [place] [address] [place], [place] [address]  Potassium     Status: None   Collection Time: 11/18/22  4:45 AM  Result Value Ref Range   Potassium 3.7 3.5 - 5.1 mmol/L    Comment: Performed at [facility], [place] [address], [place], [place] [address]  Creatinine, serum     Status: None   Collection Time: 11/18/22  4:45 AM  Result Value Ref Range   Creatinine, Ser 0.71 0.61 - 1.24 mg/dL   GFR, Estimated >60 >60 mL/min    Comment: (NOTE) Calculated using the CKD-EPI Creatinine Equation (2021) Performed at [facility], [place] [address] [place], [place] [address]   CBC     Status: Abnormal   Collection Time: 11/19/22  4:39 AM  Result Value Ref Range   WBC 14.2 (H) 4.0 - 10.5 K/uL   RBC 3.11 (L) 4.22 - 5.81 MIL/uL   Hemoglobin 8.8 (L) 13.0 - 17.0 g/dL   HCT 27.9 (L) 39.0 - 52.0 %   MCV 89.7 80.0 - 100.0 fL   MCH 28.3 26.0 - 34.0 pg   MCHC 31.5 30.0 - 36.0 g/dL   RDW 13.8 11.5 - 15.5 %   Platelets 427 (H) 150 - 400 K/uL   nRBC 0.2 0.0 - 0.2 %    Comment: Performed at [facility], [place] [address] [place], [place] [address]  Potassium     Status: None   Collection Time: 11/19/22  4:39 AM  Result Value Ref Range   Potassium 3.6 3.5 - 5.1 mmol/L    Comment:  Performed at [facility], [place] [address], [place], [place] [address]  Creatinine, serum     Status: None   Collection Time: 11/19/22  4:39 AM  Result Value Ref Range   Creatinine, Ser 0.72 0.61 - 1.24 mg/dL   GFR, Estimated >60 >60 mL/min    Comment: (NOTE) Calculated using the CKD-EPI Creatinine Equation (2021) Performed at [facility], [place] [address], [place], [place] [address]     Imaging / Studies: No results found.  Medications / Allergies: per chart  Antibiotics: Anti-infectives (From admission, onward)    Start     Dose/Rate Route Frequency Ordered Stop   11/17/22 1200  erythromycin 250 mg in sodium chloride 0.9 % 100 mL IVPB  Status:  Discontinued        250 mg 100 mL/hr over 60 Minutes Intravenous Every 8 hours 11/17/22 0828 11/19/22 0656   11/13/22 2000  ceFEPIme (MAXIPIME) 2 g in sodium chloride 0.9 % 100 mL IVPB  Status:  Discontinued       Note to Pharmacy: Pharmacy may adjust dosing strength, schedule, rate of infusion, etc as needed to optimize therapy   2 g 200 mL/hr over 30 Minutes Intravenous Every 8 hours 11/13/22 1913 11/13/22 1925   11/13/22 2000  metroNIDAZOLE (FLAGYL) IVPB 500 mg  Status:  Discontinued        500 mg 100 mL/hr over 60 Minutes Intravenous Every 6 hours 11/13/22 1913 11/13/22 1924   11/13/22 1800  ceFEPIme (MAXIPIME) 2 g in sodium chloride 0.9 % 100 mL IVPB  2 g 200 mL/hr over 30 Minutes Intravenous Every 8 hours 11/13/22 1619 11/21/22 2359   11/13/22 1800  metroNIDAZOLE (FLAGYL) IVPB 500 mg        500 mg 100 mL/hr over 60 Minutes Intravenous Every 12 hours 11/13/22 1619 11/19/22 0603   11/10/22 1430  clindamycin (CLEOCIN) IVPB 900 mg        900 mg 100 mL/hr over 30 Minutes Intravenous Every 8 hours 11/10/22 1331 11/10/22 1658   11/10/22 1400  neomycin (MYCIFRADIN) tablet 1,000 mg  Status:  Discontinued       See Hyperspace for full Linked Orders Report.   1,000 mg Oral 3 times per day  11/10/22 0637 11/10/22 0643   11/10/22 1400  metroNIDAZOLE (FLAGYL) tablet 1,000 mg  Status:  Discontinued       See Hyperspace for full Linked Orders Report.   1,000 mg Oral 3 times per day 11/10/22 0637 11/10/22 0643   11/10/22 0645  ertapenem (INVANZ) 1,000 mg in sodium chloride 0.9 % 100 mL IVPB  Status:  Discontinued        1 g 200 mL/hr over 30 Minutes Intravenous On call to O.R. 11/10/22 7408 11/10/22 1328         Note: Portions of this report may have been transcribed using voice recognition software. Every effort was made to ensure accuracy; however, inadvertent computerized transcription errors may be present.   Any transcriptional errors that result from this process are unintentional.    [name], MD, FACS, MASCRS Esophageal, Gastrointestinal & Colorectal Surgery Robotic and Minimally Invasive Surgery  [facility]. [address], [place], [place] [address] [phone] Fax [phone] Main  CONTACT INFORMATION:  Weekday (9AM-5PM): Call [facility] main office at [phone]  Weeknight (5PM-9AM) or Weekend/Holiday: Check [email or web address] (password " [email or web address]") for General Surgery [facility] coverage  (Please, do not use SecureChat as it is not reliable communication to reach operating surgeons for immediate patient care given surgeries/outpatient duties/clinic/cross-coverage/off post-call which would lead to a delay in care.  Epic staff messaging available for outptient concerns, but may not be answered for 48 hours or more).     11/19/2022  6:57 AM

## 2022-11-20 LAB — COMPREHENSIVE METABOLIC PANEL
ALT: 16 U/L (ref 0–44)
AST: 14 U/L — ABNORMAL LOW (ref 15–41)
Albumin: 2.5 g/dL — ABNORMAL LOW (ref 3.5–5.0)
Alkaline Phosphatase: 49 U/L (ref 38–126)
Anion gap: 6 (ref 5–15)
BUN: 30 mg/dL — ABNORMAL HIGH (ref 6–20)
CO2: 26 mmol/L (ref 22–32)
Calcium: 8.1 mg/dL — ABNORMAL LOW (ref 8.9–10.3)
Chloride: 112 mmol/L — ABNORMAL HIGH (ref 98–111)
Creatinine, Ser: 0.72 mg/dL (ref 0.61–1.24)
GFR, Estimated: >60 mL/min (ref >60)
Glucose, Bld: 115 mg/dL — ABNORMAL HIGH (ref 70–99)
Potassium: 3.5 mmol/L (ref 3.5–5.1)
Sodium: 144 mmol/L (ref 135–145)
Total Bilirubin: 0.4 mg/dL (ref 0.3–1.2)
Total Protein: 5.2 g/dL — ABNORMAL LOW (ref 6.5–8.1)

## 2022-11-20 LAB — CBC
HCT: 27.5 % — ABNORMAL LOW (ref 39.0–52.0)
Hemoglobin: 8.6 g/dL — ABNORMAL LOW (ref 13.0–17.0)
MCH: 28.2 pg (ref 26.0–34.0)
MCHC: 31.3 g/dL (ref 30.0–36.0)
MCV: 90.2 fL (ref 80.0–100.0)
Platelets: 442 10*3/uL — ABNORMAL HIGH (ref 150–400)
RBC: 3.05 MIL/uL — ABNORMAL LOW (ref 4.22–5.81)
RDW: 13.9 % (ref 11.5–15.5)
WBC: 15.3 10*3/uL — ABNORMAL HIGH (ref 4.0–10.5)
nRBC: 0.2 % (ref 0.0–0.2)

## 2022-11-20 LAB — PREALBUMIN: Prealbumin: 14 mg/dL — ABNORMAL LOW (ref 18–38)

## 2022-11-20 NOTE — Progress Notes (Signed)
7 Days Post-Op   Subjective/Chief Complaint: Tol diet, feeling much better, has stoma output, ambulatin   Objective: Vital signs in last 24 hours: Temp:  [98.3 F (36.8 C)-98.4 F (36.9 C)] 98.4 F (36.9 C) (01/20 0641) Pulse Rate:  [60-76] 62 (01/20 0641) Resp:  [16] 16 (01/20 0641) BP: (111-131)/(78-82) 111/82 (01/20 0641) SpO2:  [97 %-98 %] 98 % (01/20 0641) Last BM Date : 11/19/22  Intake/Output from previous day: 01/19 0701 - 01/20 0700 In: 1549.4 [P.O.:720; I.V.:675.4; IV Piggyback:154] Out: 2010 [Urine:550; Drains:10; XIPJA:2505] Intake/Output this shift: Total I/O In: 120 [P.O.:120] Out: 260 [Drains:10; Stool:250]  Ab soft approp tender ileostomy functional, drain serosang   Lab Results:  Recent Labs    11/19/22 0439 11/20/22 0019  WBC 14.2* 15.3*  HGB 8.8* 8.6*  HCT 27.9* 27.5*  PLT 427* 442*   BMET Recent Labs    11/19/22 0439 11/20/22 0019  NA  --  144  K 3.6 3.5  CL  --  112*  CO2  --  26  GLUCOSE  --  115*  BUN  --  30*  CREATININE 0.72 0.72  CALCIUM  --  8.1*   PT/INR No results for input(s): "LABPROT", "INR" in the last 72 hours. ABG No results for input(s): "PHART", "HCO3" in the last 72 hours.  Invalid input(s): "PCO2", "PO2"  Studies/Results: No results found.  Anti-infectives: Anti-infectives (From admission, onward)    Start     Dose/Rate Route Frequency Ordered Stop   11/17/22 1200  erythromycin 250 mg in sodium chloride 0.9 % 100 mL IVPB  Status:  Discontinued        250 mg 100 mL/hr over 60 Minutes Intravenous Every 8 hours 11/17/22 0828 11/19/22 0656   11/13/22 2000  ceFEPIme (MAXIPIME) 2 g in sodium chloride 0.9 % 100 mL IVPB  Status:  Discontinued       Note to Pharmacy: Pharmacy may adjust dosing strength, schedule, rate of infusion, etc as needed to optimize therapy   2 g 200 mL/hr over 30 Minutes Intravenous Every 8 hours 11/13/22 1913 11/13/22 1925   11/13/22 2000  metroNIDAZOLE (FLAGYL) IVPB 500 mg  Status:   Discontinued        500 mg 100 mL/hr over 60 Minutes Intravenous Every 6 hours 11/13/22 1913 11/13/22 1924   11/13/22 1800  ceFEPIme (MAXIPIME) 2 g in sodium chloride 0.9 % 100 mL IVPB  Status:  Discontinued        2 g 200 mL/hr over 30 Minutes Intravenous Every 8 hours 11/13/22 1619 11/19/22 0658   11/13/22 1800  metroNIDAZOLE (FLAGYL) IVPB 500 mg        500 mg 100 mL/hr over 60 Minutes Intravenous Every 12 hours 11/13/22 1619 11/19/22 0603   11/10/22 1430  clindamycin (CLEOCIN) IVPB 900 mg        900 mg 100 mL/hr over 30 Minutes Intravenous Every 8 hours 11/10/22 1331 11/10/22 1658   11/10/22 1400  neomycin (MYCIFRADIN) tablet 1,000 mg  Status:  Discontinued       See Hyperspace for full Linked Orders Report.   1,000 mg Oral 3 times per day 11/10/22 0637 11/10/22 0643   11/10/22 1400  metroNIDAZOLE (FLAGYL) tablet 1,000 mg  Status:  Discontinued       See Hyperspace for full Linked Orders Report.   1,000 mg Oral 3 times per day 11/10/22 0637 11/10/22 0643   11/10/22 0645  ertapenem (INVANZ) 1,000 mg in sodium chloride 0.9 % 100 mL IVPB  Status:  Discontinued        1 g 200 mL/hr over 30 Minutes Intravenous On call to O.R. 11/10/22 0637 11/10/22 1328       Assessment/Plan: POD 9/6 LAR/ileostomy -doing much better, continued pureed diet per Dr Johney Maine -pulm toilet -wbc still up but clinically well, consider ct tomorrow prior to any discharge if still up -hb fine -continue drain    Rolm Bookbinder 11/20/2022

## 2022-11-20 NOTE — Progress Notes (Signed)
Wife independently emptying and cleaning pt's ostomy bag. Pt helping minimally.

## 2022-11-20 NOTE — Progress Notes (Signed)
Mobility Specialist - Progress Note   11/20/22 0955  Mobility  Activity Ambulated with assistance in hallway  Level of Assistance Standby assist, set-up cues, supervision of patient - no hands on  Assistive Device Front wheel walker  Distance Ambulated (ft) 480 ft  Activity Response Tolerated well  Mobility Referral Yes  $Mobility charge 1 Mobility   Pt received in bed and agreeable to mobility. Pt bandage started leaking during ambulation. Nurse made aware. No complaints during session. Pt to recliner after session with all needs met.   [facility]

## 2022-11-21 ENCOUNTER — Inpatient Hospital Stay (HOSPITAL_COMMUNITY): Payer: No Typology Code available for payment source

## 2022-11-21 LAB — CREATININE, SERUM
Creatinine, Ser: 0.59 mg/dL — ABNORMAL LOW (ref 0.61–1.24)
GFR, Estimated: >60 mL/min (ref >60)

## 2022-11-21 LAB — POTASSIUM: Potassium: 3.6 mmol/L (ref 3.5–5.1)

## 2022-11-21 MED ORDER — IOHEXOL 300 MG/ML  SOLN
100.0000 mL | Freq: Once | INTRAMUSCULAR | Status: AC | PRN
  Administered 2022-11-21: 100 mL via INTRAVENOUS

## 2022-11-21 MED ORDER — HYDROMORPHONE HCL 1 MG/ML IJ SOLN: 1.0000 mg | INTRAMUSCULAR | Status: DC | PRN

## 2022-11-21 MED ORDER — IOHEXOL 9 MG/ML PO SOLN
500.0000 mL | ORAL | Status: AC
  Administered 2022-11-21 (×2): 500 mL via ORAL

## 2022-11-21 NOTE — Progress Notes (Signed)
8 Days Post-Op   Subjective/Chief Complaint: Tol diet, ambulating, no emesis minimal nausea   Objective: Vital signs in last 24 hours: Temp:  [98.4 F (36.9 C)-98.5 F (36.9 C)] 98.4 F (36.9 C) (01/21 0541) Pulse Rate:  [63] 63 (01/21 0541) Resp:  [14-15] 15 (01/21 0541) BP: (107-113)/(71-79) 107/79 (01/21 0541) SpO2:  [98 %] 98 % (01/21 0541) Weight:  [90.7 kg] 90.7 kg (01/21 0500) Last BM Date : 11/20/22  Intake/Output from previous day: 01/20 0701 - 01/21 0700 In: 994 [P.O.:840; IV Piggyback:154] Out: 691 [Urine:1; Drains:40; Stool:650] Intake/Output this shift: No intake/output data recorded.  Ab soft approp tender ileostomy functional, drain serous  Lab Results:  Recent Labs    11/19/22 0439 11/20/22 0019  WBC 14.2* 15.3*  HGB 8.8* 8.6*  HCT 27.9* 27.5*  PLT 427* 442*   BMET Recent Labs    11/20/22 0019 11/21/22 0616  NA 144  --   K 3.5 3.6  CL 112*  --   CO2 26  --   GLUCOSE 115*  --   BUN 30*  --   CREATININE 0.72 0.59*  CALCIUM 8.1*  --    PT/INR No results for input(s): "LABPROT", "INR" in the last 72 hours. ABG No results for input(s): "PHART", "HCO3" in the last 72 hours.  Invalid input(s): "PCO2", "PO2"  Studies/Results: No results found.  Anti-infectives: Anti-infectives (From admission, onward)    Start     Dose/Rate Route Frequency Ordered Stop   11/17/22 1200  erythromycin 250 mg in sodium chloride 0.9 % 100 mL IVPB  Status:  Discontinued        250 mg 100 mL/hr over 60 Minutes Intravenous Every 8 hours 11/17/22 0828 11/19/22 0656   11/13/22 2000  ceFEPIme (MAXIPIME) 2 g in sodium chloride 0.9 % 100 mL IVPB  Status:  Discontinued       Note to Pharmacy: Pharmacy may adjust dosing strength, schedule, rate of infusion, etc as needed to optimize therapy   2 g 200 mL/hr over 30 Minutes Intravenous Every 8 hours 11/13/22 1913 11/13/22 1925   11/13/22 2000  metroNIDAZOLE (FLAGYL) IVPB 500 mg  Status:  Discontinued        500 mg 100  mL/hr over 60 Minutes Intravenous Every 6 hours 11/13/22 1913 11/13/22 1924   11/13/22 1800  ceFEPIme (MAXIPIME) 2 g in sodium chloride 0.9 % 100 mL IVPB  Status:  Discontinued        2 g 200 mL/hr over 30 Minutes Intravenous Every 8 hours 11/13/22 1619 11/19/22 0658   11/13/22 1800  metroNIDAZOLE (FLAGYL) IVPB 500 mg        500 mg 100 mL/hr over 60 Minutes Intravenous Every 12 hours 11/13/22 1619 11/19/22 0603   11/10/22 1430  clindamycin (CLEOCIN) IVPB 900 mg        900 mg 100 mL/hr over 30 Minutes Intravenous Every 8 hours 11/10/22 1331 11/10/22 1658   11/10/22 1400  neomycin (MYCIFRADIN) tablet 1,000 mg  Status:  Discontinued       See Hyperspace for full Linked Orders Report.   1,000 mg Oral 3 times per day 11/10/22 0637 11/10/22 0643   11/10/22 1400  metroNIDAZOLE (FLAGYL) tablet 1,000 mg  Status:  Discontinued       See Hyperspace for full Linked Orders Report.   1,000 mg Oral 3 times per day 11/10/22 0637 11/10/22 0643   11/10/22 0645  ertapenem (INVANZ) 1,000 mg in sodium chloride 0.9 % 100 mL IVPB  Status:  Discontinued        1 g 200 mL/hr over 30 Minutes Intravenous On call to O.R. 11/10/22 0637 11/10/22 1328       Assessment/Plan: POD 10/7 LAR/ileostomy -doing much better, soft diet today -pulm toilet -wbc still up but clinically well with course I think will get ct today prior to any plans for discharge -hb fine -continue drain Rolm Bookbinder 11/21/2022

## 2022-11-21 NOTE — TOC Progression Note (Signed)
Transition of Care [facility]) - Progression Note    Patient Details  Name: Carlos Stone MRN: [id number] Date of Birth: [birth year]  Transition of Care [facility] CM/SW Contact  Servando Snare, [place] Phone Number: 11/21/2022, 11:48 AM  Clinical Narrative:   TOC followed up with patient regarding DME. Patient spouse at bedside. Patient and family reports no DME needed and that he has a rolling walker at home. TOC signing off. No further needs.          Expected Discharge Plan and Services                                               Social Determinants of Health (SDOH) Interventions SDOH Screenings   Food Insecurity: No Food Insecurity (11/10/2022)  Housing: Low Risk  (11/10/2022)  Transportation Needs: No Transportation Needs (11/10/2022)  Utilities: Not At Risk (11/10/2022)  Tobacco Use: Low Risk  (11/15/2022)    Readmission Risk Interventions     No data to display

## 2022-11-21 NOTE — Progress Notes (Signed)
Patient ID: Carlos Stone, male   DOB: 05-May-1976, 47 y.o.   MRN: [id number] CT I  think is what is expected. Nothing really that I think is leading to white count. I think can go home tomorrow as long as continues to do well

## 2022-11-22 ENCOUNTER — Other Ambulatory Visit (HOSPITAL_COMMUNITY): Payer: Self-pay

## 2022-11-22 ENCOUNTER — Encounter (HOSPITAL_COMMUNITY): Payer: Self-pay | Admitting: Surgery

## 2022-11-22 DIAGNOSIS — F32A Depression, unspecified: Secondary | ICD-10-CM | POA: Insufficient documentation

## 2022-11-22 DIAGNOSIS — F419 Anxiety disorder, unspecified: Secondary | ICD-10-CM

## 2022-11-22 LAB — POTASSIUM: Potassium: 4.1 mmol/L (ref 3.5–5.1)

## 2022-11-22 MED ORDER — ONDANSETRON HCL 4 MG PO TABS
4.0000 mg | ORAL_TABLET | Freq: Four times a day (QID) | ORAL | 2 refills | Status: DC | PRN
  Filled 2022-11-22: qty 6, 2d supply, fill #0

## 2022-11-22 MED ORDER — FERROUS SULFATE 325 (65 FE) MG PO TABS
325.0000 mg | ORAL_TABLET | Freq: Two times a day (BID) | ORAL | 3 refills | Status: DC
  Filled 2022-11-22: qty 30, 15d supply, fill #0

## 2022-11-22 MED ORDER — OXYCODONE HCL 5 MG PO TABS: 5.0000 mg | ORAL_TABLET | ORAL | Status: DC | PRN

## 2022-11-22 MED ORDER — LACTATED RINGERS IV BOLUS: 1000.0000 mL | Freq: Three times a day (TID) | INTRAVENOUS | Status: DC | PRN

## 2022-11-22 MED ORDER — ONDANSETRON HCL 4 MG PO TABS: 4.0000 mg | ORAL_TABLET | Freq: Four times a day (QID) | ORAL | Status: DC | PRN

## 2022-11-22 MED ORDER — METHOCARBAMOL 500 MG PO TABS: 1000.0000 mg | ORAL_TABLET | Freq: Four times a day (QID) | ORAL | Status: DC | PRN

## 2022-11-22 MED ORDER — ACETAMINOPHEN 500 MG PO TABS
1000.0000 mg | ORAL_TABLET | Freq: Four times a day (QID) | ORAL | Status: DC
  Administered 2022-11-22: 1000 mg via ORAL
  Filled 2022-11-22: qty 2

## 2022-11-22 NOTE — Discharge Summary (Signed)
Physician Discharge Summary    Patient ID: Carlos Stone MRN: [id number] DOB/AGE: Mar 24, 1976  47 y.o.  Patient Care Team: [name], MD as PCP - General (Family Medicine) [name], MD as Consulting Physician (General Surgery) [name], MD as Consulting Physician (Gastroenterology)  Admit date: 11/10/2022  Discharge date: 11/22/2022  Hospital Stay = 12 days    Discharge Diagnoses:  Principal Problem:   Diverticulitis with abscess s/p sigmoid colectomy 11/10/2022 Active Problems:   Colonic diverticular abscess   Diverticulitis   Anxiety disorder   Depressed state   11/13/2022  POST-OPERATIVE DIAGNOSIS:  SIGMOID DIVERTICULITIS WITH ABSCESS   PROCEDURE:   ROBOTIC LOW ANTERIOR RECTOSIGMOID RESECTION INTRAOPERATIVE ASSESSMENT OF PERFUSION USING FIREFLY TRANSVERSUS ABDOMINIS PLANE (TAP) BLOCK - BILATERAL RIGID PROCTOSCOPY   SURGEON:  [name], MD   OR FINDINGS:   Patient had inflamed mid distal sigmoid colon with abscess between sigmoid colon and dome of the bladder.  No strong evidence of fistula or cystitis.   No obvious metastatic disease on visceral parietal peritoneum or liver.   The anastomosis rests 12 cm from the anal verge by rigid proctoscopy.   It is a descending colon to proximal rectal 29 EEA stapled anastomosis   CASE DATA:   Type of patient?: Elective [facility] Private Case Status of Case? Elective Scheduled Infection Present At Time Of Surgery (PATOS)?  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   PROCEDURE:   Diagnostic laparoscopy. Washout and drainage of intra-abdominal abscesses x 2. Diverting loop ileostomy.   SURGEON:  [name], MD   ASSISTANT: [name], MD   OR FINDINGS:    Patient had moderate  peritonitis with evidence of some feculent peritonitis.  Interloop infraumbilical retroperitoneal abscess is walled off by small bowel that tract to the right anterior colorectal anastomosis - epicenter of inflammation and phlegmon suspicious for microperforation there.  No evidence of any major ischemia or necrosis of the colon nor rectum.  Inflammatory adhesions causing partial small bowel obstruction released.  No evidence of any small bowel perforation or injury nor gastroduodenal ulceration or perforation.  Extensive washout done.  Diverting loop ileostomy done.   Difficult nasogastric tube intubation ultimately successful with placing Ewall tube and dilating to pass 18 French nasogastric tube to decompress stomach with fecalization      Consults: Case Management / Social Work, Physical Therapy, Occupational Therapy, Pharmacy, Nutrition, Wound ostomy consult nurse (WOCN), and Anesthesia  Hospital Course:   The patient underwent the surgery above.  Postoperatively, the patient gradually mobilized and advanced to a solid diet.  Patient developed worsening pain and discomfort.  CAT scan raise suspicion of leak.  Underwent laparoscopic washout and diverting loop ileostomy.  Nasogastric tube placement was challenging but eventually placed.  Patient had ileus.  Treated with IV antibiotics.  Pain and other symptoms were treated aggressively.    Patient's ileus resolved and tolerate NG tube clamping.  NG tube removed advance to solid diet.  Ileostomy care and training done with wound ostomy nursing.  Cleared by physical therapy.  Foley catheter removed without recurrent colovesical fistula.  Pathology consistent with diverticulitis.  By the time of discharge, the patient was walking well the hallways, eating food, having flatus.  Undergoing ostomy training.  Wife more involved.  Diarrhea under control with regimen of soluble fiber, iron, as needed Imodium.  Pain was well-controlled on an oral  medications.  Based on meeting discharge criteria and continuing to recover, I felt it was safe for the patient to be discharged from the hospital to further recover with close followup. Postoperative recommendations were discussed in detail.  They are written as well.  Discharged Condition: fair  Discharge Exam: Blood pressure 100/73, pulse 64, temperature 98.7 F (37.1 C), temperature source Oral, resp. rate 16, height '5\' 9"'$  (1.753 m), weight 91.4 kg, SpO2 98 %.  General: Pt awake/alert/oriented x4 in No acute distress Eyes: PERRL, normal EOM.  Sclera clear.  No icterus Neuro: CN II-XII intact w/o focal sensory/motor deficits. Lymph: No head/neck/groin lymphadenopathy Psych:  No delerium/psychosis/paranoia HENT: Normocephalic, Mucus membranes moist.  No thrush Neck: Supple, No tracheal deviation Chest:  No chest wall pain w good excursion CV:  Pulses intact.  Regular rhythm MS: Normal AROM mjr joints.  No obvious deformity Abdomen: Soft.  Nondistended.  Nontender.  Incisions clean dry and intact.  Loop ileostomy right lower quadrant with pink rosebud and oatmeal consistency effluent in bag.  No evidence of peritonitis.  No incarcerated hernias. Ext:  SCDs BLE.  No mjr edema.  No cyanosis Skin: No petechiae / purpura   Disposition:    Follow-up Information     [name], MD. Schedule an appointment as soon as possible for a visit in 3 week(s).   Specialties: General Surgery, Colon and Rectal Surgery Why: To follow up after your operation Contact information: [place] [phone]         [facility]. Schedule an appointment as soon as possible for a visit in 1 day(s).   Specialty: General Surgery Why: TO GET OSTOMY CARE / TRAINING AT [place] information: [address] [id number] [place] [phone]                Discharge disposition:  01-Home or Self Care       Discharge Instructions     Call MD for:   Complete by: As directed    FEVER > 101.5 F  (temperatures < 101.5 F are not significant)   Call MD for:  extreme fatigue   Complete by: As directed    Call MD for:  persistant dizziness or light-headedness   Complete by: As directed    Call MD for:  persistant nausea and vomiting   Complete by: As directed    Call MD for:  redness, tenderness, or signs of infection (pain, swelling, redness, odor or green/yellow discharge around incision site)   Complete by: As directed    Call MD for:  severe uncontrolled pain   Complete by: As directed    Diet - low sodium heart healthy   Complete by: As directed    Start with a bland diet such as soups, liquids, starchy foods, low fat foods, etc. the first few days at home. Gradually advance to a solid, low-fat, high fiber diet by the end of the first week at home.   Add a fiber supplement to your diet (Metamucil, etc) If you feel full, bloated, or constipated, stay on a full liquid or pureed/blenderized diet for a few days until you feel better and are no longer constipated.   Discharge instructions   Complete by: As directed    See Discharge Instructions If you are not getting better after two weeks or are noticing you are getting worse, contact our  office [phone] for further advice.  We may need to adjust your medications, re-evaluate you in the office, send you to the emergency room, or see what other things we can do to help. The clinic staff is available to answer your questions during regular business hours (8:30am-5pm).  Please don't hesitate to call and ask to speak to one of our nurses for clinical concerns.    A surgeon from [facility] is always on call at the hospitals 24 hours/day If you have a medical emergency, go to the nearest emergency room or call 911.   Discharge wound care:   Complete by: As directed    It is good for closed  incisions and even open wounds to be washed every day.  Shower every day.  Short baths are fine.  Wash the incisions and wounds clean with soap & water.    You may leave closed incisions open to air if it is dry.   You may cover the incision with clean gauze & replace it after your daily shower for comfort.  TEGADERM:  You have clear gauze band-aid dressings over your closed incision(s).  Remove the dressings 3 days after surgery = Saturday 11/13/2022   Driving Restrictions   Complete by: As directed    You may drive when: - you are no longer taking narcotic prescription pain medication - you can comfortably wear a seatbelt - you can safely make sudden turns/stops without pain.   Increase activity slowly   Complete by: As directed    Start light daily activities --- self-care, walking, climbing stairs- beginning the day after surgery.  Gradually increase activities as tolerated.  Control your pain to be active.  Stop when you are tired.  Ideally, walk several times a day, eventually an hour a day.   Most people are back to most day-to-day activities in a few weeks.  It takes 4-6 weeks to get back to unrestricted, intense activity. If you can walk 30 minutes without difficulty, it is safe to try more intense activity such as jogging, treadmill, bicycling, low-impact aerobics, swimming, etc. Save the most intensive and strenuous activity for last (Usually 4-8 weeks after surgery) such as sit-ups, heavy lifting, contact sports, etc.  Refrain from any intense heavy lifting or straining until you are off narcotics for pain control.  You will have off days, but things should improve week-by-week. DO NOT PUSH THROUGH PAIN.  Let pain be your guide: If it hurts to do something, don't do it.   Lifting restrictions   Complete by: As directed    If you can walk 30 minutes without difficulty, it is safe to try more intense activity such as jogging, treadmill, bicycling, low-impact aerobics, swimming, etc. Save  the most intensive and strenuous activity for last (Usually 4-8 weeks after surgery) such as sit-ups, heavy lifting, contact sports, etc.   Refrain from any intense heavy lifting or straining until you are off narcotics for pain control.  You will have off days, but things should improve week-by-week. DO NOT PUSH THROUGH PAIN.  Let pain be your guide: If it hurts to do something, don't do it.  Pain is your body warning you to avoid that activity for another week until the pain goes down.   May shower / Bathe   Complete by: As directed    May walk up steps   Complete by: As directed    Remove dressing in 72 hours   Complete by: As directed  Make sure all dressings are removed by the third day after surgery.  Leave incisions open to air.  OK to cover incisions with gauze or bandages as desired   Sexual Activity Restrictions   Complete by: As directed    You may have sexual intercourse when it is comfortable. If it hurts to do something, stop.       Allergies as of 11/22/2022       Reactions   Compazine [prochlorperazine Edisylate] Other (See Comments)   Hallucination   Amoxil [amoxicillin] Rash   Tolerated cefepime fine   Cipro [ciprofloxacin Hcl] Rash   Penicillins Rash        Medication List     STOP taking these medications    oxyCODONE 5 MG immediate release tablet Commonly known as: Oxy IR/ROXICODONE       TAKE these medications    acetaminophen 325 MG tablet Commonly known as: TYLENOL Take 2 tablets (650 mg total) by mouth every 6 (six) hours as needed for mild pain (or Fever >/= 101).   escitalopram 20 MG tablet Commonly known as: LEXAPRO Take 20 mg by mouth daily.   ferrous sulfate 325 (65 FE) MG tablet Take 1 tablet (325 mg total) by mouth 2 (two) times daily with a meal.   ondansetron 4 MG tablet Commonly known as: ZOFRAN Take 1 tablet (4 mg total) by mouth every 6 (six) hours as needed for nausea or vomiting (Use Zofran (ondansetron) 1st).                Durable Medical Equipment  (From admission, onward)           Start     Ordered   11/17/22 1031  For home use only DME 4 wheeled rolling walker with seat  Once       Question:  Patient needs a walker to treat with the following condition  Answer:  Colon perforation ([place])   11/17/22 1041              Discharge Care Instructions  (From admission, onward)           Start     Ordered   11/10/22 0000  Discharge wound care:       Comments: It is good for closed incisions and even open wounds to be washed every day.  Shower every day.  Short baths are fine.  Wash the incisions and wounds clean with soap & water.    You may leave closed incisions open to air if it is dry.   You may cover the incision with clean gauze & replace it after your daily shower for comfort.  TEGADERM:  You have clear gauze band-aid dressings over your closed incision(s).  Remove the dressings 3 days after surgery = Saturday 11/13/2022   11/10/22 0837            Significant Diagnostic Studies:  Results for orders placed or performed during the hospital encounter of 11/10/22 (from the past 72 hour(s))  Prealbumin     Status: Abnormal   Collection Time: 11/20/22 12:19 AM  Result Value Ref Range   Prealbumin 14 (L) 18 - 38 mg/dL    Comment: Performed at [facility] [address], [place], [place] [address]  Comprehensive metabolic panel     Status: Abnormal   Collection Time: 11/20/22 12:19 AM  Result Value Ref Range   Sodium 144 135 - 145 mmol/L   Potassium 3.5 3.5 - 5.1 mmol/L  Chloride 112 (H) 98 - 111 mmol/L   CO2 26 22 - 32 mmol/L   Glucose, Bld 115 (H) 70 - 99 mg/dL    Comment: Glucose reference range applies only to samples taken after fasting for at least 8 hours.   BUN 30 (H) 6 - 20 mg/dL   Creatinine, Ser 0.72 0.61 - 1.24 mg/dL   Calcium 8.1 (L) 8.9 - 10.3 mg/dL   Total Protein 5.2 (L) 6.5 - 8.1 g/dL   Albumin 2.5 (L) 3.5 - 5.0 g/dL   AST 14 (L) 15 - 41 U/L    ALT 16 0 - 44 U/L   Alkaline Phosphatase 49 38 - 126 U/L   Total Bilirubin 0.4 0.3 - 1.2 mg/dL   GFR, Estimated >60 >60 mL/min    Comment: (NOTE) Calculated using the CKD-EPI Creatinine Equation (2021)    Anion gap 6 5 - 15    Comment: Performed at [facility], [place] [address] [place], [place] [address]  CBC     Status: Abnormal   Collection Time: 11/20/22 12:19 AM  Result Value Ref Range   WBC 15.3 (H) 4.0 - 10.5 K/uL   RBC 3.05 (L) 4.22 - 5.81 MIL/uL   Hemoglobin 8.6 (L) 13.0 - 17.0 g/dL   HCT 27.5 (L) 39.0 - 52.0 %   MCV 90.2 80.0 - 100.0 fL   MCH 28.2 26.0 - 34.0 pg   MCHC 31.3 30.0 - 36.0 g/dL   RDW 13.9 11.5 - 15.5 %   Platelets 442 (H) 150 - 400 K/uL   nRBC 0.2 0.0 - 0.2 %    Comment: Performed at [facility], [place] [address], [place], [place] [address]  Creatinine, serum     Status: Abnormal   Collection Time: 11/21/22  6:16 AM  Result Value Ref Range   Creatinine, Ser 0.59 (L) 0.61 - 1.24 mg/dL   GFR, Estimated >60 >60 mL/min    Comment: (NOTE) Calculated using the CKD-EPI Creatinine Equation (2021) Performed at [facility], [place] [address] [place], [place] [address]   Potassium     Status: None   Collection Time: 11/21/22  6:16 AM  Result Value Ref Range   Potassium 3.6 3.5 - 5.1 mmol/L    Comment: Performed at [facility], [place] [address], [place], [place] [address]  Potassium     Status: None   Collection Time: 11/22/22  5:03 AM  Result Value Ref Range   Potassium 4.1 3.5 - 5.1 mmol/L    Comment: Performed at [facility], [place] [address] [place],  [address]    No results found.  Past Medical History:  Diagnosis Date   Anxiety disorder    Community acquired pneumonia 09/21/2013   Diverticulosis    with abcess 09/06/22   Meningitis    age 42    Past Surgical History:  Procedure Laterality Date   APPENDECTOMY  2005   COLOSTOMY N/A 11/13/2022    Procedure: COLOSTOMY;  Surgeon: [name], MD;  Location: [facility];  Service: General;  Laterality: N/A;   IR RADIOLOGIST EVAL & MGMT  07/23/2022   KNEE ARTHROSCOPY Right 2005   LAPAROSCOPY N/A 11/13/2022   Procedure: LAPAROSCOPY DIAGNOSTIC [place] AND DRAINAGE OF INTRAABDOMINAL ABSCESSES X 2, DIVERTING LOOP ILEOSTOMY;  Surgeon: [name], MD;  Location: [facility];  Service: General;  Laterality: N/A;   PROCTOSCOPY N/A 11/10/2022   Procedure: RIGID PROCTOSCOPY;  Surgeon: [name], MD;  Location: [facility];  Service: General;  Laterality: N/A;    Social History   Socioeconomic History   Marital status: Married    Spouse name: Not on file   Number of children: Not on file   Years of education: Not on file   Highest education level: Not on file  Occupational History   Not on file  Tobacco Use   Smoking status: Never   Smokeless tobacco: Never  Vaping Use   Vaping Use: Never used  Substance and Sexual Activity   Alcohol use: Yes    Alcohol/week: 1.0 standard drink of alcohol    Types: 1 Glasses of wine per week    Comment: occasional   Drug use: No   Sexual activity: Yes  Other Topics Concern   Not on file  Social History Narrative   Not on file   Social Determinants of Health   Financial Resource Strain: Not on file  Food Insecurity: No Food Insecurity (11/10/2022)   Hunger Vital Sign    Worried About Running Out of Food in the Last Year: Never true    Ran Out of Food in the Last Year: Never true  Transportation Needs: No Transportation Needs (11/10/2022)   PRAPARE - Hydrologist (Medical): No    Lack of Transportation (Non-Medical): No  Physical Activity: Not on file  Stress: Not on file  Social Connections: Not on file  Intimate Partner Violence: Not At Risk (11/10/2022)   Humiliation, Afraid, Rape, and Kick questionnaire    Fear of Current or Ex-Partner: No    Emotionally Abused: No    Physically Abused: No    Sexually Abused: No     History reviewed. No pertinent family history.  Current Facility-Administered Medications  Medication Dose Route Frequency Provider Last Rate Last Admin   0.9 %  sodium chloride infusion  250 mL Intravenous PRN [name], MD   Stopped at 11/12/22 1108   acetaminophen (TYLENOL) tablet 1,000 mg  1,000 mg Oral Q6H Travon Crochet, Remo Lipps, MD       alum & mag hydroxide-simeth (MAALOX/MYLANTA) 200-200-20 MG/5ML suspension 30 mL  30 mL Oral Q6H PRN [name], MD       diphenhydrAMINE (BENADRYL) injection 25 mg  25 mg Intravenous Q6H PRN [name], MD   25 mg at 11/19/22 2149   enoxaparin (LOVENOX) injection 40 mg  40 mg Subcutaneous Q24H [name], MD   40 mg at 11/21/22 2359   escitalopram (LEXAPRO) tablet 20 mg  20 mg Oral Daily [name], MD   20 mg at 11/21/22 0955   ferrous sulfate tablet 325 mg  325 mg Oral BID WC [name], MD   325 mg at 11/21/22 1748   hydrALAZINE (APRESOLINE) injection 10 mg  10 mg Intravenous Q2H PRN [name], MD       HYDROmorphone (DILAUDID) injection 1-2 mg  1-2 mg Intravenous Q4H PRN Rolm Bookbinder, MD       lactated ringers bolus 1,000 mL  1,000 mL Intravenous Q8H PRN [name], MD       lip balm (CARMEX) ointment   Topical BID [name], MD   Given at 11/21/22 2130   loperamide (IMODIUM) capsule 2-4 mg  2-4 mg Oral Q6H PRN [name], MD       LORazepam (ATIVAN) injection 0.5-1 mg  0.5-1 mg Intravenous Q8H PRN [name], MD   1 mg at 11/21/22 2213   methocarbamol (ROBAXIN) tablet 1,000 mg  1,000 mg Oral  Q6H PRN [name], MD       metoprolol tartrate (LOPRESSOR) injection 5 mg  5 mg Intravenous Q6H PRN [name], MD       ondansetron (ZOFRAN) 8 mg/NS 50 ml IVPB  8 mg Intravenous Q6H PRN [name], MD 216 mL/hr at 11/21/22 1743 8 mg at 11/21/22 1743   ondansetron (ZOFRAN) tablet 4 mg  4 mg Oral Q6H PRN [name], MD       Oral care mouth rinse  15 mL Mouth Rinse PRN [name], MD       oxyCODONE (Oxy  IR/ROXICODONE) immediate release tablet 5-10 mg  5-10 mg Oral Q4H PRN [name], MD       phenol (CHLORASEPTIC) mouth spray 1 spray  1 spray Mouth/Throat PRN [name], MD       polycarbophil (FIBERCON) tablet 625 mg  625 mg Oral BID [name], MD   625 mg at 11/21/22 2124   simethicone (MYLICON) chewable tablet 40 mg  40 mg Oral Q6H PRN [name], MD   40 mg at 11/13/22 0759   sodium chloride flush (NS) 0.9 % injection 3 mL  3 mL Intravenous Gorden Harms, MD   3 mL at 11/21/22 2129   sodium chloride flush (NS) 0.9 % injection 3 mL  3 mL Intravenous PRN [name], MD         Allergies  Allergen Reactions   Compazine [Prochlorperazine Edisylate] Other (See Comments)    Hallucination    Amoxil [Amoxicillin] Rash    Tolerated cefepime fine   Cipro [Ciprofloxacin Hcl] Rash   Penicillins Rash    Signed:   [name], MD, FACS, MASCRS Esophageal, Gastrointestinal & Colorectal Surgery Robotic and Minimally Invasive Surgery  [facility] [address] [place], [place] [address] [phone] Fax [phone] Main  CONTACT INFORMATION:  Weekday (9AM-5PM): Call [facility] main office at [phone]  Weeknight (5PM-9AM) or Weekend/Holiday: Check [email or web address] (password " [email or web address]") for General Surgery [facility] coverage  (Please, do not use SecureChat as it is not reliable communication to reach operating surgeons for immediate patient care given surgeries/outpatient duties/clinic/cross-coverage/off post-call which would lead to a delay in care.  Epic staff messaging available for outptient concerns, but may not be answered for 48 hours or more).     11/22/2022, 8:33 AM

## 2022-11-22 NOTE — Progress Notes (Signed)
Patient was given discharge instructions, and all questions were answered.  Patient was stable for discharge and was taken to the main exit by wheelchair. 

## 2022-11-22 NOTE — Consult Note (Addendum)
Carlos Stone Nurse ostomy consult note Pt is feeling nauseated and does not watch the pouch change or assist with the process.  Significant other performed all steps independently. She was able to cut the barrier, stretch and apply the barrier ring to the wafer, snap the wafer and pouch together, and open and close the Velcro to empty.   Stoma is red and viable, slightly above the skin level, 1 1/2 inches.  Reviewed pouching routines and ordering supplies. Discussed dietary precautions and avoiding dehydration. Output: emptied 50 cc liquid green stool. Ostomy pouching: 5 sets of supplies left at the bedside for use after discharge; barrier ring, Carlos Stone # [phone], wafer Carlos Stone # [id number], pouch [place] # 15 Enrolled patient in [facility] program: Yes, previously Pt plans for discharge today and they deny further questions.  Thank-you,  Carlos Girt MSN, [place], [place], [place], [place]

## 2022-11-24 ENCOUNTER — Other Ambulatory Visit (HOSPITAL_COMMUNITY): Payer: Self-pay

## 2022-11-30 ENCOUNTER — Other Ambulatory Visit (HOSPITAL_COMMUNITY): Payer: Self-pay

## 2022-12-03 ENCOUNTER — Ambulatory Visit (HOSPITAL_COMMUNITY): Payer: PRIVATE HEALTH INSURANCE | Admitting: Nurse Practitioner

## 2022-12-06 ENCOUNTER — Ambulatory Visit (HOSPITAL_COMMUNITY)
Admission: RE | Admit: 2022-12-06 | Discharge: 2022-12-06 | Disposition: A | Discharge status: Home / Self Care | Payer: PRIVATE HEALTH INSURANCE | Source: Ambulatory Visit | Attending: Surgery | Admitting: Surgery

## 2022-12-06 DIAGNOSIS — Z932 Ileostomy status: Secondary | ICD-10-CM | POA: Diagnosis present

## 2022-12-06 NOTE — Progress Notes (Signed)
[facility]   Reason for visit:  RLQ ileostomy- back to work, primarily driving to sales calls, etc.  HPI:  diverticulitis Past Medical History:  Diagnosis Date   Anxiety disorder    Community acquired pneumonia 09/21/2013   Diverticulosis    with abcess 09/06/22   Meningitis    age 47   No family history on file. Allergies  Allergen Reactions   Compazine [Prochlorperazine Edisylate] Other (See Comments)    Hallucination    Amoxil [Amoxicillin] Rash    Tolerated cefepime fine   Cipro [Ciprofloxacin Hcl] Rash   Penicillins Rash   Current Outpatient Medications  Medication Sig Dispense Refill Last Dose   acetaminophen (TYLENOL) 325 MG tablet Take 2 tablets (650 mg total) by mouth every 6 (six) hours as needed for mild pain (or Fever >/= 101). (Patient not taking: Reported on 10/29/2022)      escitalopram (LEXAPRO) 20 MG tablet Take 20 mg by mouth daily.      ferrous sulfate 325 (65 FE) MG tablet Take 1 tablet (325 mg total) by mouth 2 (two) times daily with a meal. 30 tablet 3    ondansetron (ZOFRAN) 4 MG tablet Take 1 tablet (4 mg total) by mouth every 6 (six) hours as needed for nausea or vomiting (Use Zofran (ondansetron) 1st). 6 tablet 2    No current facility-administered medications for this encounter.   ROS  Review of Systems  Gastrointestinal:        RLQ ileostomy Advancing diet.  Skin: Negative.   Psychiatric/Behavioral: Negative.    All other systems reviewed and are negative.  Vital signs:  There were no vitals taken for this visit. Exam:  Physical Exam Constitutional:      Appearance: Normal appearance.  Abdominal:     Palpations: Abdomen is soft.     Comments: RLQ ileostomy present  Musculoskeletal:     Comments: Regaining strength  Skin:    General: Skin is warm and dry.  Neurological:     Mental Status: He is alert and oriented to person, place, and time.  Psychiatric:        Mood and Affect: Mood normal.        Behavior: Behavior  normal.     Stoma type/location:  RLQ ileostomy Stomal assessment/size:  1 3/8" pink and moist Peristomal assessment:  intact barrier ring to protect and promote seal Treatment options for stomal/peristomal skin: barrier ring 2 piece 2 3/4" pouch Output: soft brown stool Ostomy pouching: 2pc. Pouch with barrier ring  Education provided:  discussed returning to work. Adding ostomy belt for additional security.  Reminded patient of risk for dehydration and need to drink, take in adequate electrolytes.  He states he is. Reminded not to avoid drinking to slow output. He agrees not to do this.     Impression/dx  ileostomy Discussion  Will set up with [facility] for supplies Plan  Given needed supplies Follow up as needed    Visit time: 50 minutes.   Domenic Moras FNP-BC

## 2022-12-06 NOTE — Discharge Instructions (Signed)
Pouch change:  Clean with soap and water and pat dry.  Apply barrier ring Cut opening to 34 mm Warm pouch after applying to seal Added ostomy belt Will enroll with [facility] for supplies.

## 2022-12-11 DIAGNOSIS — Z932 Ileostomy status: Secondary | ICD-10-CM | POA: Insufficient documentation

## 2022-12-14 ENCOUNTER — Ambulatory Visit: Payer: Self-pay | Admitting: Surgery

## 2022-12-14 ENCOUNTER — Ambulatory Visit (HOSPITAL_COMMUNITY)
Admission: RE | Admit: 2022-12-14 | Discharge: 2022-12-14 | Disposition: A | Discharge status: Home / Self Care | Payer: PRIVATE HEALTH INSURANCE | Source: Ambulatory Visit | Attending: Family Medicine | Admitting: Family Medicine

## 2022-12-14 DIAGNOSIS — Z432 Encounter for attention to ileostomy: Secondary | ICD-10-CM | POA: Insufficient documentation

## 2022-12-14 NOTE — Discharge Instructions (Signed)
Set up with [facility] 2 piece 2 1/4"  Barrier ring Powder  Skin prep

## 2022-12-14 NOTE — Progress Notes (Signed)
[facility]   Reason for visit:  RLQ ileostomy HPI:   Past Medical History:  Diagnosis Date   Anxiety disorder    Community acquired pneumonia 09/21/2013   Diverticulosis    with abcess 09/06/22   Meningitis    age 47   No family history on file. Allergies  Allergen Reactions   Compazine [Prochlorperazine Edisylate] Other (See Comments)    Hallucination    Amoxil [Amoxicillin] Rash    Tolerated cefepime fine   Cipro [Ciprofloxacin Hcl] Rash   Penicillins Rash   Current Outpatient Medications  Medication Sig Dispense Refill Last Dose   acetaminophen (TYLENOL) 325 MG tablet Take 2 tablets (650 mg total) by mouth every 6 (six) hours as needed for mild pain (or Fever >/= 101). (Patient not taking: Reported on 10/29/2022)      escitalopram (LEXAPRO) 20 MG tablet Take 20 mg by mouth daily.      ferrous sulfate 325 (65 FE) MG tablet Take 1 tablet (325 mg total) by mouth 2 (two) times daily with a meal. 30 tablet 3    ondansetron (ZOFRAN) 4 MG tablet Take 1 tablet (4 mg total) by mouth every 6 (six) hours as needed for nausea or vomiting (Use Zofran (ondansetron) 1st). 6 tablet 2    No current facility-administered medications for this encounter.   ROS  Review of Systems  Gastrointestinal:        RLQ ileostomy  Skin: Negative.   Psychiatric/Behavioral: Negative.    All other systems reviewed and are negative.  Vital signs:  There were no vitals taken for this visit. Exam:  Physical Exam Constitutional:      Appearance: Normal appearance.  Abdominal:     Palpations: Abdomen is soft.  Skin:    General: Skin is warm and dry.  Neurological:     Mental Status: He is alert and oriented to person, place, and time.  Psychiatric:        Mood and Affect: Mood normal.        Behavior: Behavior normal.     Stoma type/location:  1" budded Stomal assessment/size:  pink and moist Peristomal assessment:  intact Treatment options for stomal/peristomal skin: barrier  ring, 2 piece 2 1/4" pouch Output: liquid brown stool Ostomy pouching: 2pc. Pouch  wears belt when working (traveling)  Education provided:  Taking drinks in car for long drives, wife continues to assist with pouch care    Impression/dx  ileostomy Discussion  Anxious for reversal (3 months)   Plan  Follow up with clinic as needed    Visit time: 40 minutes.   Domenic Moras FNP-BC

## 2022-12-15 ENCOUNTER — Other Ambulatory Visit: Payer: Self-pay

## 2022-12-15 ENCOUNTER — Encounter (HOSPITAL_COMMUNITY): Payer: Self-pay | Admitting: Emergency Medicine

## 2022-12-15 ENCOUNTER — Inpatient Hospital Stay (HOSPITAL_COMMUNITY)
Admission: EM | Admit: 2022-12-15 | Discharge: 2022-12-18 | DRG: 446 | Disposition: A | Discharge status: Home / Self Care | Payer: No Typology Code available for payment source | Attending: General Surgery | Admitting: General Surgery

## 2022-12-15 ENCOUNTER — Emergency Department (HOSPITAL_COMMUNITY): Payer: No Typology Code available for payment source

## 2022-12-15 DIAGNOSIS — Z881 Allergy status to other antibiotic agents status: Secondary | ICD-10-CM

## 2022-12-15 DIAGNOSIS — Z932 Ileostomy status: Secondary | ICD-10-CM | POA: Diagnosis not present

## 2022-12-15 DIAGNOSIS — Z880 Allergy status to penicillin: Secondary | ICD-10-CM | POA: Diagnosis not present

## 2022-12-15 DIAGNOSIS — Z9049 Acquired absence of other specified parts of digestive tract: Secondary | ICD-10-CM | POA: Diagnosis not present

## 2022-12-15 DIAGNOSIS — F32A Depression, unspecified: Secondary | ICD-10-CM | POA: Diagnosis present

## 2022-12-15 DIAGNOSIS — K810 Acute cholecystitis: Secondary | ICD-10-CM | POA: Diagnosis not present

## 2022-12-15 DIAGNOSIS — Z888 Allergy status to other drugs, medicaments and biological substances status: Secondary | ICD-10-CM

## 2022-12-15 DIAGNOSIS — Z79899 Other long term (current) drug therapy: Secondary | ICD-10-CM

## 2022-12-15 LAB — CBC WITH DIFFERENTIAL/PLATELET
Abs Immature Granulocytes: 0.12 10*3/uL — ABNORMAL HIGH (ref 0.00–0.07)
Basophils Absolute: 0.1 10*3/uL (ref 0.0–0.1)
Basophils Relative: 0 %
Eosinophils Absolute: 0 10*3/uL (ref 0.0–0.5)
Eosinophils Relative: 0 %
HCT: 36.5 % — ABNORMAL LOW (ref 39.0–52.0)
Hemoglobin: 11.3 g/dL — ABNORMAL LOW (ref 13.0–17.0)
Immature Granulocytes: 1 %
Lymphocytes Relative: 6 %
Lymphs Abs: 1.1 10*3/uL (ref 0.7–4.0)
MCH: 26.9 pg (ref 26.0–34.0)
MCHC: 31 g/dL (ref 30.0–36.0)
MCV: 86.9 fL (ref 80.0–100.0)
Monocytes Absolute: 1.5 10*3/uL — ABNORMAL HIGH (ref 0.1–1.0)
Monocytes Relative: 8 %
Neutro Abs: 14.8 10*3/uL — ABNORMAL HIGH (ref 1.7–7.7)
Neutrophils Relative %: 85 %
Platelets: 375 10*3/uL (ref 150–400)
RBC: 4.2 MIL/uL — ABNORMAL LOW (ref 4.22–5.81)
RDW: 13.6 % (ref 11.5–15.5)
WBC: 17.6 10*3/uL — ABNORMAL HIGH (ref 4.0–10.5)
nRBC: 0 % (ref 0.0–0.2)

## 2022-12-15 LAB — COMPREHENSIVE METABOLIC PANEL
ALT: 19 U/L (ref 0–44)
AST: 18 U/L (ref 15–41)
Albumin: 4.1 g/dL (ref 3.5–5.0)
Alkaline Phosphatase: 93 U/L (ref 38–126)
Anion gap: 7 (ref 5–15)
BUN: 14 mg/dL (ref 6–20)
CO2: 25 mmol/L (ref 22–32)
Calcium: 9.3 mg/dL (ref 8.9–10.3)
Chloride: 103 mmol/L (ref 98–111)
Creatinine, Ser: 0.75 mg/dL (ref 0.61–1.24)
GFR, Estimated: >60 mL/min (ref >60)
Glucose, Bld: 134 mg/dL — ABNORMAL HIGH (ref 70–99)
Potassium: 3.8 mmol/L (ref 3.5–5.1)
Sodium: 135 mmol/L (ref 135–145)
Total Bilirubin: 0.9 mg/dL (ref 0.3–1.2)
Total Protein: 7.8 g/dL (ref 6.5–8.1)

## 2022-12-15 LAB — URINALYSIS, ROUTINE W REFLEX MICROSCOPIC
Bilirubin Urine: NEGATIVE
Glucose, UA: NEGATIVE mg/dL
Hgb urine dipstick: NEGATIVE
Ketones, ur: NEGATIVE mg/dL
Leukocytes,Ua: NEGATIVE
Nitrite: NEGATIVE
Protein, ur: NEGATIVE mg/dL
Specific Gravity, Urine: >1.046 — ABNORMAL HIGH (ref 1.005–1.030)
pH: 5 (ref 5.0–8.0)

## 2022-12-15 LAB — LIPASE, BLOOD: Lipase: 68 U/L — ABNORMAL HIGH (ref 11–51)

## 2022-12-15 LAB — CBG MONITORING, ED: Glucose-Capillary: 129 mg/dL — ABNORMAL HIGH (ref 70–99)

## 2022-12-15 MED ORDER — DOCUSATE SODIUM 100 MG PO CAPS
100.0000 mg | ORAL_CAPSULE | Freq: Two times a day (BID) | ORAL | Status: DC
  Filled 2022-12-15: qty 1

## 2022-12-15 MED ORDER — SODIUM CHLORIDE 0.9 % IV SOLN
2.0000 g | Freq: Three times a day (TID) | INTRAVENOUS | Status: DC
  Administered 2022-12-15 – 2022-12-18 (×8): 2 g via INTRAVENOUS
  Filled 2022-12-15 (×10): qty 12.5

## 2022-12-15 MED ORDER — METHOCARBAMOL 500 MG PO TABS: 500.0000 mg | ORAL_TABLET | Freq: Three times a day (TID) | ORAL | Status: DC | PRN

## 2022-12-15 MED ORDER — HYDROMORPHONE HCL 1 MG/ML IJ SOLN
0.5000 mg | Freq: Once | INTRAMUSCULAR | Status: AC
  Administered 2022-12-15: 0.5 mg via INTRAVENOUS
  Filled 2022-12-15: qty 1

## 2022-12-15 MED ORDER — IOHEXOL 300 MG/ML  SOLN
100.0000 mL | Freq: Once | INTRAMUSCULAR | Status: AC | PRN
  Administered 2022-12-15: 100 mL via INTRAVENOUS

## 2022-12-15 MED ORDER — ACETAMINOPHEN 500 MG PO TABS: 1000.0000 mg | ORAL_TABLET | Freq: Four times a day (QID) | ORAL | Status: DC | PRN

## 2022-12-15 MED ORDER — DIPHENHYDRAMINE HCL 50 MG/ML IJ SOLN: 25.0000 mg | Freq: Four times a day (QID) | INTRAMUSCULAR | Status: DC | PRN

## 2022-12-15 MED ORDER — SODIUM CHLORIDE 0.9 % IV BOLUS
1000.0000 mL | Freq: Once | INTRAVENOUS | Status: AC
  Administered 2022-12-15: 1000 mL via INTRAVENOUS

## 2022-12-15 MED ORDER — METHOCARBAMOL 1000 MG/10ML IJ SOLN: 500.0000 mg | Freq: Three times a day (TID) | INTRAVENOUS | Status: DC | PRN

## 2022-12-15 MED ORDER — ENOXAPARIN SODIUM 40 MG/0.4ML IJ SOSY
40.0000 mg | PREFILLED_SYRINGE | INTRAMUSCULAR | Status: DC
  Administered 2022-12-15 – 2022-12-17 (×3): 40 mg via SUBCUTANEOUS
  Filled 2022-12-15 (×3): qty 0.4

## 2022-12-15 MED ORDER — ONDANSETRON HCL 4 MG/2ML IJ SOLN
4.0000 mg | Freq: Four times a day (QID) | INTRAMUSCULAR | Status: DC | PRN
  Administered 2022-12-15 – 2022-12-17 (×5): 4 mg via INTRAVENOUS
  Filled 2022-12-15 (×5): qty 2

## 2022-12-15 MED ORDER — HYDROMORPHONE HCL 1 MG/ML IJ SOLN
1.0000 mg | Freq: Once | INTRAMUSCULAR | Status: AC
  Administered 2022-12-15: 1 mg via INTRAVENOUS
  Filled 2022-12-15: qty 1

## 2022-12-15 MED ORDER — MELATONIN 3 MG PO TABS: 3.0000 mg | ORAL_TABLET | Freq: Every evening | ORAL | Status: DC | PRN

## 2022-12-15 MED ORDER — SODIUM CHLORIDE 0.9 % IV SOLN
2.0000 g | Freq: Once | INTRAVENOUS | Status: AC
  Administered 2022-12-15: 2 g via INTRAVENOUS
  Filled 2022-12-15: qty 12.5

## 2022-12-15 MED ORDER — MORPHINE SULFATE (PF) 2 MG/ML IV SOLN
2.0000 mg | INTRAVENOUS | Status: DC | PRN
  Administered 2022-12-15: 2 mg via INTRAVENOUS
  Filled 2022-12-15: qty 1

## 2022-12-15 MED ORDER — METRONIDAZOLE 500 MG/100ML IV SOLN
500.0000 mg | Freq: Two times a day (BID) | INTRAVENOUS | Status: DC
  Administered 2022-12-15 – 2022-12-18 (×6): 500 mg via INTRAVENOUS
  Filled 2022-12-15 (×6): qty 100

## 2022-12-15 MED ORDER — HYDRALAZINE HCL 20 MG/ML IJ SOLN: 10.0000 mg | INTRAMUSCULAR | Status: DC | PRN

## 2022-12-15 MED ORDER — OXYCODONE HCL 5 MG PO TABS
5.0000 mg | ORAL_TABLET | ORAL | Status: DC | PRN
  Administered 2022-12-16: 5 mg via ORAL
  Administered 2022-12-16: 10 mg via ORAL
  Filled 2022-12-15: qty 1
  Filled 2022-12-15: qty 2

## 2022-12-15 MED ORDER — ONDANSETRON HCL 4 MG/2ML IJ SOLN
4.0000 mg | Freq: Once | INTRAMUSCULAR | Status: AC
  Administered 2022-12-15: 4 mg via INTRAVENOUS
  Filled 2022-12-15: qty 2

## 2022-12-15 MED ORDER — ONDANSETRON 4 MG PO TBDP
4.0000 mg | ORAL_TABLET | Freq: Four times a day (QID) | ORAL | Status: DC | PRN
  Administered 2022-12-16: 4 mg via ORAL
  Filled 2022-12-15: qty 1

## 2022-12-15 MED ORDER — IBUPROFEN 400 MG PO TABS: 600.0000 mg | ORAL_TABLET | Freq: Four times a day (QID) | ORAL | Status: DC | PRN

## 2022-12-15 MED ORDER — DIPHENHYDRAMINE HCL 25 MG PO CAPS: 25.0000 mg | ORAL_CAPSULE | Freq: Four times a day (QID) | ORAL | Status: DC | PRN

## 2022-12-15 MED ORDER — SIMETHICONE 80 MG PO CHEW: 40.0000 mg | CHEWABLE_TABLET | Freq: Four times a day (QID) | ORAL | Status: DC | PRN

## 2022-12-15 MED ORDER — METRONIDAZOLE 500 MG/100ML IV SOLN
500.0000 mg | Freq: Once | INTRAVENOUS | Status: AC
  Administered 2022-12-15: 500 mg via INTRAVENOUS
  Filled 2022-12-15: qty 100

## 2022-12-15 MED ORDER — SODIUM CHLORIDE 0.9 % IV SOLN: INTRAVENOUS | Status: DC

## 2022-12-15 NOTE — Consult Note (Signed)
Carlos Stone February 19, 1976  [id number].    Requesting MD: Theodis Blaze, PA-C Chief Complaint/Reason for Consult: Possible Cholecystitis   HPI: Carlos Stone is a 47 y.o. male who presented to the ED with abdominal pain.  Patient is known to our service.  He recently underwent a robotic LAR by Dr. Johney Maine on 1/10.  On 1/13 he was found to have fluid and gas surrounding the anastomotic site with significant pneumoperitoneum on CT scan. He was taken back to the OR by Dr. Johney Maine for diagnostic laparoscopy, washout and drainage of intra-abdominal abscess x 2 and diverting loop ileostomy.  IntraOp noted to have feculent peritonitis with interloop infraumbilical retroperitoneal abscess walled off by small bowel that tract to the right anterior colorectal anastomosis - epicenter of inflammation and phlegmon with suspicion for microperforation there.  Patiently ultimately discharged 1/22.  He reports he was doing well at home: tolerating diet without nausea or vomiting and having ileostomy output.  Over the weekend he began having some lower back pain, periumbilical abdominal pain, nausea and dry heaves.  This self resolved.  His pain recurred around 11:30 PM last night after eating and has been persistent since that time.  He denies any fevers at home but does report chills. Now having RUQ/LUQ pain that he thinks is from dry heaving. In the ED he was afebrile without hypotension.  Initially tachycardic that resolved after IV fluids.  WBC 17.6.  LFTs within normal limits.  Lipase mildly elevated at 68.  It appears he has had mildly elevated lipase dating back to 9 years. CT w/ no obvious issues related to his recent surgery.  Ileostomy in the RLQ appears uncomplicated, no signs of SBO, less ascites than previous and no evidence of abscess.  However this showed distended gallbladder with wall thickening and surrounding inflammatory changes.  There is also some inflammation of the ascending colon near the hepatic  flexure that radiology feels is secondary to gallbladder inflammation. We were asked to see.   He denies excessive alcohol use or history of pancreatitis in the past.  Denies prior history of gallstones.  Denies ever having symptoms similar to this aside from that as listed above. Other abdominal surgery includes open appendectomy in his 20's. He is not on blood thinners. Does not appear he is on any GLP-1 receptor agonist.   ROS: ROS As above, see HPI  History reviewed. No pertinent family history.  Past Medical History:  Diagnosis Date   Anxiety disorder    Community acquired pneumonia 09/21/2013   Diverticulosis    with abcess 09/06/22   Meningitis    age 69    Past Surgical History:  Procedure Laterality Date   APPENDECTOMY  2005   COLOSTOMY N/A 11/13/2022   Procedure: COLOSTOMY;  Surgeon: Jazira Maloney Boston, MD;  Location: [facility];  Service: General;  Laterality: N/A;   IR RADIOLOGIST EVAL & MGMT  07/23/2022   KNEE ARTHROSCOPY Right 2005   LAPAROSCOPY N/A 11/13/2022   Procedure: LAPAROSCOPY DIAGNOSTIC [place] AND DRAINAGE OF INTRAABDOMINAL ABSCESSES X 2, DIVERTING LOOP ILEOSTOMY;  Surgeon: Caden Fatica Boston, MD;  Location: [facility];  Service: General;  Laterality: N/A;   PROCTOSCOPY N/A 11/10/2022   Procedure: RIGID PROCTOSCOPY;  Surgeon: Tylin Force Boston, MD;  Location: [facility];  Service: General;  Laterality: N/A;    Social History:  reports that he has never smoked. He has never used smokeless tobacco. He reports current alcohol use of about 1.0 standard drink of alcohol per week.  He reports that he does not use drugs.  Allergies:  Allergies  Allergen Reactions   Compazine [Prochlorperazine Edisylate] Other (See Comments)    Hallucinations    Amoxil [Amoxicillin] Rash    Tolerated cefepime fine   Cipro [Ciprofloxacin Hcl] Rash   Penicillins Rash    (Not in a hospital admission)    Physical Exam: Blood pressure 113/80, pulse 96, temperature 98.3 F (36.8 C), temperature  source Oral, resp. rate 17, height 5' 9"$  (1.753 m), weight 81.2 kg, SpO2 97 %. General: pleasant, WD/WN male who is laying in bed in NAD HEENT: head is normocephalic, atraumatic.  Sclera are noninjected.  PERRL.  Ears and nose without any masses or lesions.  Mouth is pink and moist. Dentition fair Heart: regular, rate, and rhythm.  Extremities wwp Lungs: CTAB, no wheezes, rhonchi, or rales noted.  Respiratory effort nonlabored Abd: Soft, ND, he does have some RUQ ttp but is otherwise NT. Negative Murphy's sign. Prior surgical incisions healing well. Right sided ileostomy with viable budded stoma and stool in bag. +BS. MS: no BUE or BLE edema Skin: warm and dry  Psych: A&Ox4 with an appropriate affect Neuro: cranial nerves grossly intact, normal speech, thought process intact, moves all extremities, gait not assessed  Results for orders placed or performed during the hospital encounter of 12/15/22 (from the past 48 hour(s))  CBG monitoring, ED     Status: Abnormal   Collection Time: 12/15/22  1:05 PM  Result Value Ref Range   Glucose-Capillary 129 (H) 70 - 99 mg/dL    Comment: Glucose reference range applies only to samples taken after fasting for at least 8 hours.  Comprehensive metabolic panel     Status: Abnormal   Collection Time: 12/15/22  1:07 PM  Result Value Ref Range   Sodium 135 135 - 145 mmol/L   Potassium 3.8 3.5 - 5.1 mmol/L   Chloride 103 98 - 111 mmol/L   CO2 25 22 - 32 mmol/L   Glucose, Bld 134 (H) 70 - 99 mg/dL    Comment: Glucose reference range applies only to samples taken after fasting for at least 8 hours.   BUN 14 6 - 20 mg/dL   Creatinine, Ser 0.75 0.61 - 1.24 mg/dL   Calcium 9.3 8.9 - 10.3 mg/dL   Total Protein 7.8 6.5 - 8.1 g/dL   Albumin 4.1 3.5 - 5.0 g/dL   AST 18 15 - 41 U/L   ALT 19 0 - 44 U/L   Alkaline Phosphatase 93 38 - 126 U/L   Total Bilirubin 0.9 0.3 - 1.2 mg/dL   GFR, Estimated >60 >60 mL/min    Comment: (NOTE) Calculated using the CKD-EPI  Creatinine Equation (2021)    Anion gap 7 5 - 15    Comment: Performed at [facility], [place] [address] [place], [place] [address]  Lipase, blood     Status: Abnormal   Collection Time: 12/15/22  1:07 PM  Result Value Ref Range   Lipase 68 (H) 11 - 51 U/L    Comment: Performed at [facility], [place] [address], [place], [place] [address]  CBC with Differential     Status: Abnormal   Collection Time: 12/15/22  1:07 PM  Result Value Ref Range   WBC 17.6 (H) 4.0 - 10.5 K/uL   RBC 4.20 (L) 4.22 - 5.81 MIL/uL   Hemoglobin 11.3 (L) 13.0 - 17.0 g/dL   HCT 36.5 (L) 39.0 - 52.0 %   MCV  86.9 80.0 - 100.0 fL   MCH 26.9 26.0 - 34.0 pg   MCHC 31.0 30.0 - 36.0 g/dL   RDW 13.6 11.5 - 15.5 %   Platelets 375 150 - 400 K/uL   nRBC 0.0 0.0 - 0.2 %   Neutrophils Relative % 85 %   Neutro Abs 14.8 (H) 1.7 - 7.7 K/uL   Lymphocytes Relative 6 %   Lymphs Abs 1.1 0.7 - 4.0 K/uL   Monocytes Relative 8 %   Monocytes Absolute 1.5 (H) 0.1 - 1.0 K/uL   Eosinophils Relative 0 %   Eosinophils Absolute 0.0 0.0 - 0.5 K/uL   Basophils Relative 0 %   Basophils Absolute 0.1 0.0 - 0.1 K/uL   Immature Granulocytes 1 %   Abs Immature Granulocytes 0.12 (H) 0.00 - 0.07 K/uL    Comment: Performed at [facility], [place] [address] [place], [place] [address]   CT Abdomen Pelvis W Contrast  Result Date: 12/15/2022 CLINICAL DATA:  Abdominal pain. Postop bowel obstruction suspected. Recent ileostomy. EXAM: CT ABDOMEN AND PELVIS WITH CONTRAST TECHNIQUE: Multidetector CT imaging of the abdomen and pelvis was performed using the standard protocol following bolus administration of intravenous contrast. RADIATION DOSE REDUCTION: This exam was performed according to the departmental dose-optimization program which includes automated exposure control, adjustment of the mA and/or kV according to patient size and/or use of iterative reconstruction technique. CONTRAST:  155m  OMNIPAQUE IOHEXOL 300 MG/ML  SOLN COMPARISON:  11/21/2022 FINDINGS: Lower chest: Mild patchy persistent atelectasis in the lower lungs, improved since the prior study. Hepatobiliary: No liver parenchymal lesion. Distended gallbladder with possible wall thickening and surrounding inflammatory change. Tiny stone previously seen dependent in the gallbladder is no longer visualized. Mild increase in prominence of the common bile duct. I do not identify ductal stone. Pancreas: Normal Spleen: Normal Adrenals/Urinary Tract: Adrenal glands are normal. Kidneys are normal. Bladder is normal. Stomach/Bowel: Stomach appears normal. Ileostomy in the right lower quadrant appears uncomplicated. No sign of bowel obstruction. Edematous changes so she aided with the ascending colon near the hepatic flexure is favored to be secondary to the gallbladder inflammatory disease. Vascular/Lymphatic: Aorta and IVC are normal.  No adenopathy. Reproductive: Normal other than an enlarged prostate gland. Other: Less ascites than seen previously. No evidence of loculation/abscess Musculoskeletal: Negative IMPRESSION: 1. Distended gallbladder with wall thickening and surrounding inflammatory change. Tiny stone previously seen dependent in the gallbladder is no longer visualized. Mild increase in prominence of the common bile duct. I do not identify a ductal stone however. Findings are suggestive of acute cholecystitis. Regional inflammation is also seen associated with the ascending colon near the hepatic flexure. I favor that this is secondary from the gallbladder inflammation but cannot rule out the possibility of colitis being the primary abnormality in this case, with regional inflammation simulating cholecystitis. Unless the clinical picture is compelling, one could consider nuclear medicine hepatobiliary scan to try to clarify this differential diagnosis. 2. Ileostomy in the right lower quadrant appears uncomplicated. No sign of bowel  obstruction. 3. Less ascites than seen previously. No evidence of loculation/abscess. 4. Mild patchy atelectasis in the lower lungs, improved since the prior study. 5. Enlarged prostate gland. Electronically Signed   By: MNelson ChimesM.D.   On: 12/15/2022 14:19    Anti-infectives (From admission, onward)    Start     Dose/Rate Route Frequency Ordered Stop   12/15/22 1430  ceFEPIme (MAXIPIME) 2 g in sodium chloride 0.9 % 100 mL IVPB  See Hyperspace for full Linked Orders Report.   2 g 200 mL/hr over 30 Minutes Intravenous  Once 12/15/22 1427 12/15/22 1449   12/15/22 1430  metroNIDAZOLE (FLAGYL) IVPB 500 mg       See Hyperspace for full Linked Orders Report.   500 mg 100 mL/hr over 60 Minutes Intravenous  Once 12/15/22 1427          Assessment/Plan Possible cholecystitis  47 y/o M with recent history of robotic LAR/CV fistula takedown for complicated diverticulitis 11/10/22 by Dr. Johney Maine complicated by anastomotic breakdown on POD#3 requiring diagnostic laparoscopy, washout of IAAx2, and diverting loop ileostomy on 11/13/2022. He presents to the ED today with acute onset abdominal pain and nausea. CT scan of the abdomen shows a distended gallbladder with wall thickening and mild increase in prominence of the CBD. There is also some inflammation of the ascending colon that radiology questions is reactive. Would recommend admission for bowel rest and IV antibiotics. Will obtain HIDA scan to confirm Acute Cholecystitis. If this is positive, would recommend IR Perc Chole drain given how close he is out from his recent surgeries. Radiology also noted mild increase prominence of CBD, however no stone was identified on imaging and LFT's are wnl. Will continue to trend labs while here. Will also reach out to Dr. Johney Maine and let him know patient is in the hospital. Admit to observation. Keep NPO. Start IV abx. Repeat labs in am.   FEN - NPO, IVF VTE - SCDs, Lovenox ID - [email or web address]   I reviewed nursing  notes, ED provider notes, last 24 h vitals and pain scores, last 48 h intake and output, last 24 h labs and trends, and last 24 h imaging results.  Alferd Apa, [facility] 12/15/2022, 3:46 PM Please see Amion for pager number during day hours 7:00am-4:30pm or 7:00am -11:30am on weekends

## 2022-12-15 NOTE — ED Provider Notes (Signed)
[place] AT [facility] Provider Note   CSN: [id number] Arrival date & time: 12/15/22  1141     History  Chief Complaint  Patient presents with   Abdominal Pain    Carlos Stone is a 47 y.o. male.  With past medical history of anxiety, diverticulitis who presents to the emergency department with abdominal pain.  Patient states that beginning last night around 11:30 PM he started having nausea and dry heaving.  States that he ate dinner, felt normal and then symptoms started.  He had associated cold chills.  He had an appointment with his general surgeon yesterday and was feeling normal at the time.  He states that he had surgery on 11/10/2022 for colon reconstruction and then had an emergent ileostomy on 11/13/2022.  He denies having any diarrhea, dysuria, objective fever.  States that the output has been normal from his colostomy without hematochezia or melena.  On chart review, appears patient had anterior rectosigmoid resection on 11/10/22 with Dr. Johney Maine with [facility]. He then had a pneumoperitoneum and microperforation at colorectal anastomosis. Had emergent laparoscopic operation with intra-abdominal abscess washout and diverting loop ileostomy. He was then discharged on 11/22/22. Had follow-up yesterday with possible reversal ion 3 months.    Abdominal Pain Associated symptoms: chills and nausea   Associated symptoms: no diarrhea and no vomiting        Home Medications Prior to Admission medications   Medication Sig Start Date End Date Taking? Authorizing Provider  acetaminophen (TYLENOL) 325 MG tablet Take 2 tablets (650 mg total) by mouth every 6 (six) hours as needed for mild pain (or Fever >/= 101). Patient taking differently: Take 650 mg by mouth as needed for mild pain. 07/11/22  Yes Elgergawy, Silver Huguenin, MD  escitalopram (LEXAPRO) 20 MG tablet Take 20 mg by mouth daily. 02/23/21  Yes [provider]  ferrous sulfate 325 (65  FE) MG tablet Take 1 tablet (325 mg total) by mouth 2 (two) times daily with a meal. Patient taking differently: Take 325 mg by mouth daily. 11/22/22  Yes [name], MD  ibuprofen (ADVIL) 200 MG tablet Take 600 mg by mouth as needed.   Yes [provider]  methocarbamol (ROBAXIN) 500 MG tablet Take 500 mg by mouth as needed for muscle spasms. 12/14/22  Yes [provider]  Multiple Vitamins-Minerals (MULTIVITAMIN WITH MINERALS) tablet Take 2 tablets by mouth daily.   Yes [provider]  ondansetron (ZOFRAN) 4 MG tablet Take 1 tablet (4 mg total) by mouth every 6 (six) hours as needed for nausea or vomiting (Use Zofran (ondansetron) 1st). Patient taking differently: Take 4 mg by mouth as needed for nausea or vomiting. 11/22/22  Yes Gross, Remo Lipps, MD  Wheat Dextrin (BENEFIBER PO) Take 15 mLs by mouth daily.   Yes [provider]      Allergies    Compazine [prochlorperazine edisylate], Amoxil [amoxicillin], Cipro [ciprofloxacin hcl], and Penicillins    Review of Systems   Review of Systems  Constitutional:  Positive for chills.  Gastrointestinal:  Positive for abdominal pain and nausea. Negative for diarrhea and vomiting.  All other systems reviewed and are negative.   Physical Exam Updated Vital Signs BP 117/76   Pulse 80   Temp 99 F (37.2 C) (Oral)   Resp 20   Ht 5' 9"$  (1.753 m)   Wt 81.2 kg   SpO2 98%   BMI 26.43 kg/m  Physical Exam Vitals and nursing note reviewed.  Constitutional:      General: He is not in acute distress.    Appearance: Normal appearance. He is well-developed. He is obese. He is not ill-appearing.  HENT:     Head: Normocephalic.  Eyes:     General: No scleral icterus.    Extraocular Movements: Extraocular movements intact.  Cardiovascular:     Rate and Rhythm: Regular rhythm. Tachycardia present.     Heart sounds: Normal heart sounds. No murmur heard. Pulmonary:     Effort: Pulmonary effort is normal. No  respiratory distress.     Breath sounds: Normal breath sounds.  Abdominal:     General: Abdomen is protuberant. Bowel sounds are decreased. There is no distension.     Palpations: Abdomen is soft.     Tenderness: There is abdominal tenderness in the periumbilical area. There is no guarding or rebound.     Comments: Ileostomy at RLQ   Skin:    General: Skin is warm and dry.     Capillary Refill: Capillary refill takes less than 2 seconds.  Neurological:     General: No focal deficit present.     Mental Status: He is alert and oriented to person, place, and time.  Psychiatric:        Mood and Affect: Mood normal.        Behavior: Behavior normal.     ED Results / Procedures / Treatments   Labs (all labs ordered are listed, but only abnormal results are displayed) Labs Reviewed  COMPREHENSIVE METABOLIC PANEL - Abnormal; Notable for the following components:      Result Value   Glucose, Bld 134 (*)    All other components within normal limits  LIPASE, BLOOD - Abnormal; Notable for the following components:   Lipase 68 (*)    All other components within normal limits  CBC WITH DIFFERENTIAL/PLATELET - Abnormal; Notable for the following components:   WBC 17.6 (*)    RBC 4.20 (*)    Hemoglobin 11.3 (*)    HCT 36.5 (*)    Neutro Abs 14.8 (*)    Monocytes Absolute 1.5 (*)    Abs Immature Granulocytes 0.12 (*)    All other components within normal limits  URINALYSIS, ROUTINE W REFLEX MICROSCOPIC - Abnormal; Notable for the following components:   Specific Gravity, Urine >1.046 (*)    All other components within normal limits  CBG MONITORING, ED - Abnormal; Notable for the following components:   Glucose-Capillary 129 (*)    All other components within normal limits    EKG None  Radiology CT Abdomen Pelvis W Contrast  Result Date: 12/15/2022 CLINICAL DATA:  Abdominal pain. Postop bowel obstruction suspected. Recent ileostomy. EXAM: CT ABDOMEN AND PELVIS WITH CONTRAST  TECHNIQUE: Multidetector CT imaging of the abdomen and pelvis was performed using the standard protocol following bolus administration of intravenous contrast. RADIATION DOSE REDUCTION: This exam was performed according to the departmental dose-optimization program which includes automated exposure control, adjustment of the mA and/or kV according to patient size and/or use of iterative reconstruction technique. CONTRAST:  139m OMNIPAQUE IOHEXOL 300 MG/ML  SOLN COMPARISON:  11/21/2022 FINDINGS: Lower chest: Mild patchy persistent atelectasis in the lower lungs, improved since the prior study. Hepatobiliary: No liver parenchymal lesion. Distended gallbladder with possible wall thickening and surrounding inflammatory change. Tiny stone previously seen dependent in the gallbladder is no longer visualized. Mild increase in prominence of the common bile duct. I do not identify ductal stone. Pancreas: Normal Spleen: Normal Adrenals/Urinary Tract:  Adrenal glands are normal. Kidneys are normal. Bladder is normal. Stomach/Bowel: Stomach appears normal. Ileostomy in the right lower quadrant appears uncomplicated. No sign of bowel obstruction. Edematous changes so she aided with the ascending colon near the hepatic flexure is favored to be secondary to the gallbladder inflammatory disease. Vascular/Lymphatic: Aorta and IVC are normal.  No adenopathy. Reproductive: Normal other than an enlarged prostate gland. Other: Less ascites than seen previously. No evidence of loculation/abscess Musculoskeletal: Negative IMPRESSION: 1. Distended gallbladder with wall thickening and surrounding inflammatory change. Tiny stone previously seen dependent in the gallbladder is no longer visualized. Mild increase in prominence of the common bile duct. I do not identify a ductal stone however. Findings are suggestive of acute cholecystitis. Regional inflammation is also seen associated with the ascending colon near the hepatic flexure. I favor  that this is secondary from the gallbladder inflammation but cannot rule out the possibility of colitis being the primary abnormality in this case, with regional inflammation simulating cholecystitis. Unless the clinical picture is compelling, one could consider nuclear medicine hepatobiliary scan to try to clarify this differential diagnosis. 2. Ileostomy in the right lower quadrant appears uncomplicated. No sign of bowel obstruction. 3. Less ascites than seen previously. No evidence of loculation/abscess. 4. Mild patchy atelectasis in the lower lungs, improved since the prior study. 5. Enlarged prostate gland. Electronically Signed   By: Nelson Chimes M.D.   On: 12/15/2022 14:19    Procedures Procedures   Medications Ordered in ED Medications  enoxaparin (LOVENOX) injection 40 mg (has no administration in time range)  0.9 %  sodium chloride infusion (has no administration in time range)  ceFEPIme (MAXIPIME) 2 g in sodium chloride 0.9 % 100 mL IVPB (has no administration in time range)    And  metroNIDAZOLE (FLAGYL) IVPB 500 mg (has no administration in time range)  acetaminophen (TYLENOL) tablet 1,000 mg (has no administration in time range)  ibuprofen (ADVIL) tablet 600 mg (has no administration in time range)  oxyCODONE (Oxy IR/ROXICODONE) immediate release tablet 5-10 mg (has no administration in time range)  morphine (PF) 2 MG/ML injection 2 mg (has no administration in time range)  methocarbamol (ROBAXIN) tablet 500 mg (has no administration in time range)    Or  methocarbamol (ROBAXIN) 500 mg in dextrose 5 % 50 mL IVPB (has no administration in time range)  melatonin tablet 3 mg (has no administration in time range)  diphenhydrAMINE (BENADRYL) capsule 25 mg (has no administration in time range)    Or  diphenhydrAMINE (BENADRYL) injection 25 mg (has no administration in time range)  docusate sodium (COLACE) capsule 100 mg (has no administration in time range)  ondansetron (ZOFRAN-ODT)  disintegrating tablet 4 mg (has no administration in time range)    Or  ondansetron (ZOFRAN) injection 4 mg (has no administration in time range)  simethicone (MYLICON) chewable tablet 40 mg (has no administration in time range)  hydrALAZINE (APRESOLINE) injection 10 mg (has no administration in time range)  sodium chloride 0.9 % bolus 1,000 mL (0 mLs Intravenous Stopped 12/15/22 1446)  ondansetron (ZOFRAN) injection 4 mg (4 mg Intravenous Given 12/15/22 1306)  HYDROmorphone (DILAUDID) injection 0.5 mg (0.5 mg Intravenous Given 12/15/22 1305)  iohexol (OMNIPAQUE) 300 MG/ML solution 100 mL (100 mLs Intravenous Contrast Given 12/15/22 1355)  HYDROmorphone (DILAUDID) injection 1 mg (1 mg Intravenous Given 12/15/22 1417)  ceFEPIme (MAXIPIME) 2 g in sodium chloride 0.9 % 100 mL IVPB (0 g Intravenous Stopped 12/15/22 1449)    And  metroNIDAZOLE (FLAGYL) IVPB 500 mg (0 mg Intravenous Stopped 12/15/22 1654)    ED Course/ Medical Decision Making/ A&P Clinical Course as of 12/15/22 1654  Wed Dec 15, 2022  1458 Spoke with Cecilio Asper, PA-C with [facility] [place] [facility] who will come see patient.  [LA]    Clinical Course User Index [LA] Mickie Hillier, PA-C   {   Medical Decision Making Amount and/or Complexity of Data Reviewed Labs: ordered. Radiology: ordered.  Risk Prescription drug management. Decision regarding hospitalization.  Initial Impression and Ddx 47 year old male who presents to the emergency department with abdominal pain. Patient PMH that increases complexity of ED encounter: Diverticulitis Differential: Acute hepatobiliary disease, pancreatitis, appendicitis, PUD, gastritis, SBO, diverticulitis, colitis, viral gastroenteritis, Crohn's, UC, vascular catastrophe, UTI, pyelonephritis, renal stone, obstructed stone, infected stone, testicular torsion, epididymitis, incarcerated hernia, STD, etc.    Interpretation of Diagnostics I independent reviewed and interpreted the labs as  followed: WBC 17.6, elevated from previous; lipase 68, CMP unremarkable  - I independently visualized the following imaging with scope of interpretation limited to determining acute life threatening conditions related to emergency care: CT abdomen pelvis, which revealed acute cholecystitis  Patient Reassessment and Ultimate Disposition/Management Patient initially given 0.5 mg of Dilaudid and Zofran with mild improvement of symptoms but ongoing pain so he was redosed with Dilaudid and had improvement with second dose.  Given his recent surgical history and complications, we proceeded with CT abdomen pelvis with contrast to evaluate his abdominal pain.  Found to have acute cholecystitis.  On reassessment he does state that his right upper back has been hurting and may have had some right upper quadrant pain over the past 1 to 2 days.  This is consistent with findings of acute cholecystitis.  No obvious stone there.  His lipase is 68 which appears to be a more chronic finding.  He does not have any LFT elevation which is more reassuring.  No altered mental status or fever concerning for cholangitis. I went ahead and order cefepime and Flagyl and consulted general surgery.  Spoke with Alferd Apa, PA-C with general surgery who will come evaluate the patient.  Surgery to admit.  Patient is agreeable to plan.  Patient management required discussion with the following services or consulting groups:  General/Trauma Surgery  Complexity of Problems Addressed Acute complicated illness or Injury  Additional Data Reviewed and Analyzed Further history obtained from: Further history from spouse/family member, Past medical history and medications listed in the EMR, Prior ED visit notes, Recent discharge summary, Care Everywhere, and Prior labs/imaging results  Patient Encounter Risk Assessment Consideration of hospitalization and Major procedures  Final Clinical Impression(s) / ED Diagnoses Final  diagnoses:  Acute cholecystitis    Rx / DC Orders ED Discharge Orders     None         Mickie Hillier, PA-C 12/15/22 1655    Horton, Alvin Critchley, DO 12/15/22 1716

## 2022-12-15 NOTE — ED Notes (Signed)
ED TO INPATIENT HANDOFF REPORT  ED Nurse Name and Phone #: Renette Butters Name/Age/Gender Carlos Stone 47 y.o. male Room/Bed: WA04/WA04  Code Status   Code Status: Full Code  Home/SNF/Other Home Patient oriented to: self, place, time, and situation Is this baseline? Yes   Triage Complete: Triage complete  Chief Complaint Acute cholecystitis [K81.0]  Triage Note Patient arrives in wheelchair with significant other by POV c/o lower abdominal pain and nausea onset of last night about 11pm. Patient reports having colon surgery last month. Patient also having back pain.    Allergies Allergies  Allergen Reactions   Compazine [Prochlorperazine Edisylate] Other (See Comments)    Hallucinations    Amoxil [Amoxicillin] Rash    Tolerated cefepime fine   Cipro [Ciprofloxacin Hcl] Rash   Penicillins Rash    Level of Care/Admitting Diagnosis ED Disposition     ED Disposition  Admit   Condition  --   Comment  Hospital Area: [place] [[id number]]  Level of Care: Med-Surg [16]  May place patient in observation at [facility] or [place] if equivalent level of care is available:: No  Covid Evaluation: Asymptomatic - no recent exposure (last 10 days) testing not required  Diagnosis: Acute cholecystitis [575.0.ICD-9-CM]  Admitting Physician: Stafford, [place]  Attending Physician: CCS, MD [[id number]]          B Medical/Surgery History Past Medical History:  Diagnosis Date   Anxiety disorder    Community acquired pneumonia 09/21/2013   Diverticulosis    with abcess 09/06/22   Meningitis    age 59   Past Surgical History:  Procedure Laterality Date   APPENDECTOMY  2005   COLOSTOMY N/A 11/13/2022   Procedure: COLOSTOMY;  Surgeon: [name], MD;  Location: [facility];  Service: General;  Laterality: N/A;   IR RADIOLOGIST EVAL & MGMT  07/23/2022   KNEE ARTHROSCOPY Right 2005   LAPAROSCOPY N/A 11/13/2022   Procedure: LAPAROSCOPY DIAGNOSTIC [place] AND  DRAINAGE OF INTRAABDOMINAL ABSCESSES X 2, DIVERTING LOOP ILEOSTOMY;  Surgeon: [name], MD;  Location: [facility];  Service: General;  Laterality: N/A;   PROCTOSCOPY N/A 11/10/2022   Procedure: RIGID PROCTOSCOPY;  Surgeon: [name], MD;  Location: [facility];  Service: General;  Laterality: N/A;     A IV Location/Drains/Wounds Patient Lines/Drains/Airways Status     Active Line/Drains/Airways     Name Placement date Placement time Site Days   Peripheral IV 12/15/22 20 G Anterior;Left Forearm 12/15/22  1305  Forearm  less than 1   Colostomy RLQ 11/13/22  --  RLQ  32   Incision - 2 Ports 1: Superior;Umbilicus 2: Right;Upper 11/13/22  --  -- 32   Incision - 5 Ports Abdomen 1: Upper 2: Right 3: Right;Lateral 4: Right;Lower 5: Right;Upper 11/10/22  0940  -- 35            Intake/Output Last 24 hours  Intake/Output Summary (Last 24 hours) at 12/15/2022 1710 Last data filed at 12/15/2022 1654 Gross per 24 hour  Intake 1088.17 ml  Output --  Net 1088.17 ml    Labs/Imaging Results for orders placed or performed during the hospital encounter of 12/15/22 (from the past 48 hour(s))  Urinalysis, Routine w reflex microscopic -Urine, Clean Catch     Status: Abnormal   Collection Time: 12/15/22 12:04 PM  Result Value Ref Range   Color, Urine YELLOW YELLOW   APPearance CLEAR CLEAR   Specific Gravity, Urine >1.046 (H) 1.005 - 1.030  pH 5.0 5.0 - 8.0   Glucose, UA NEGATIVE NEGATIVE mg/dL   Hgb urine dipstick NEGATIVE NEGATIVE   Bilirubin Urine NEGATIVE NEGATIVE   Ketones, ur NEGATIVE NEGATIVE mg/dL   Protein, ur NEGATIVE NEGATIVE mg/dL   Nitrite NEGATIVE NEGATIVE   Leukocytes,Ua NEGATIVE NEGATIVE    Comment: Performed at [facility] [address] [place], [place] [address]  CBG monitoring, ED     Status: Abnormal   Collection Time: 12/15/22  1:05 PM  Result Value Ref Range   Glucose-Capillary 129 (H) 70 - 99 mg/dL    Comment: Glucose reference range applies  only to samples taken after fasting for at least 8 hours.  Comprehensive metabolic panel     Status: Abnormal   Collection Time: 12/15/22  1:07 PM  Result Value Ref Range   Sodium 135 135 - 145 mmol/L   Potassium 3.8 3.5 - 5.1 mmol/L   Chloride 103 98 - 111 mmol/L   CO2 25 22 - 32 mmol/L   Glucose, Bld 134 (H) 70 - 99 mg/dL    Comment: Glucose reference range applies only to samples taken after fasting for at least 8 hours.   BUN 14 6 - 20 mg/dL   Creatinine, Ser 0.75 0.61 - 1.24 mg/dL   Calcium 9.3 8.9 - 10.3 mg/dL   Total Protein 7.8 6.5 - 8.1 g/dL   Albumin 4.1 3.5 - 5.0 g/dL   AST 18 15 - 41 U/L   ALT 19 0 - 44 U/L   Alkaline Phosphatase 93 38 - 126 U/L   Total Bilirubin 0.9 0.3 - 1.2 mg/dL   GFR, Estimated >60 >60 mL/min    Comment: (NOTE) Calculated using the CKD-EPI Creatinine Equation (2021)    Anion gap 7 5 - 15    Comment: Performed at [facility], [place] [address] [place], [place] [address]  Lipase, blood     Status: Abnormal   Collection Time: 12/15/22  1:07 PM  Result Value Ref Range   Lipase 68 (H) 11 - 51 U/L    Comment: Performed at [facility], [place] [address], [place], [place] [address]  CBC with Differential     Status: Abnormal   Collection Time: 12/15/22  1:07 PM  Result Value Ref Range   WBC 17.6 (H) 4.0 - 10.5 K/uL   RBC 4.20 (L) 4.22 - 5.81 MIL/uL   Hemoglobin 11.3 (L) 13.0 - 17.0 g/dL   HCT 36.5 (L) 39.0 - 52.0 %   MCV 86.9 80.0 - 100.0 fL   MCH 26.9 26.0 - 34.0 pg   MCHC 31.0 30.0 - 36.0 g/dL   RDW 13.6 11.5 - 15.5 %   Platelets 375 150 - 400 K/uL   nRBC 0.0 0.0 - 0.2 %   Neutrophils Relative % 85 %   Neutro Abs 14.8 (H) 1.7 - 7.7 K/uL   Lymphocytes Relative 6 %   Lymphs Abs 1.1 0.7 - 4.0 K/uL   Monocytes Relative 8 %   Monocytes Absolute 1.5 (H) 0.1 - 1.0 K/uL   Eosinophils Relative 0 %   Eosinophils Absolute 0.0 0.0 - 0.5 K/uL   Basophils Relative 0 %   Basophils Absolute 0.1 0.0 - 0.1 K/uL    Immature Granulocytes 1 %   Abs Immature Granulocytes 0.12 (H) 0.00 - 0.07 K/uL    Comment: Performed at [facility], [place] [address], [place], [place] [address]   CT Abdomen Pelvis W Contrast  Result  Date: 12/15/2022 CLINICAL DATA:  Abdominal pain. Postop bowel obstruction suspected. Recent ileostomy. EXAM: CT ABDOMEN AND PELVIS WITH CONTRAST TECHNIQUE: Multidetector CT imaging of the abdomen and pelvis was performed using the standard protocol following bolus administration of intravenous contrast. RADIATION DOSE REDUCTION: This exam was performed according to the departmental dose-optimization program which includes automated exposure control, adjustment of the mA and/or kV according to patient size and/or use of iterative reconstruction technique. CONTRAST:  191m OMNIPAQUE IOHEXOL 300 MG/ML  SOLN COMPARISON:  11/21/2022 FINDINGS: Lower chest: Mild patchy persistent atelectasis in the lower lungs, improved since the prior study. Hepatobiliary: No liver parenchymal lesion. Distended gallbladder with possible wall thickening and surrounding inflammatory change. Tiny stone previously seen dependent in the gallbladder is no longer visualized. Mild increase in prominence of the common bile duct. I do not identify ductal stone. Pancreas: Normal Spleen: Normal Adrenals/Urinary Tract: Adrenal glands are normal. Kidneys are normal. Bladder is normal. Stomach/Bowel: Stomach appears normal. Ileostomy in the right lower quadrant appears uncomplicated. No sign of bowel obstruction. Edematous changes so she aided with the ascending colon near the hepatic flexure is favored to be secondary to the gallbladder inflammatory disease. Vascular/Lymphatic: Aorta and IVC are normal.  No adenopathy. Reproductive: Normal other than an enlarged prostate gland. Other: Less ascites than seen previously. No evidence of loculation/abscess Musculoskeletal: Negative IMPRESSION: 1. Distended gallbladder with wall  thickening and surrounding inflammatory change. Tiny stone previously seen dependent in the gallbladder is no longer visualized. Mild increase in prominence of the common bile duct. I do not identify a ductal stone however. Findings are suggestive of acute cholecystitis. Regional inflammation is also seen associated with the ascending colon near the hepatic flexure. I favor that this is secondary from the gallbladder inflammation but cannot rule out the possibility of colitis being the primary abnormality in this case, with regional inflammation simulating cholecystitis. Unless the clinical picture is compelling, one could consider nuclear medicine hepatobiliary scan to try to clarify this differential diagnosis. 2. Ileostomy in the right lower quadrant appears uncomplicated. No sign of bowel obstruction. 3. Less ascites than seen previously. No evidence of loculation/abscess. 4. Mild patchy atelectasis in the lower lungs, improved since the prior study. 5. Enlarged prostate gland. Electronically Signed   By: MNelson ChimesM.D.   On: 12/15/2022 14:19    Pending Labs Unresulted Labs (From admission, onward)     Start     Ordered   12/16/22 0500  Comprehensive metabolic panel  Tomorrow morning,   R        12/15/22 1652   12/16/22 0500  CBC  Tomorrow morning,   R        12/15/22 1652            Vitals/Pain Today's Vitals   12/15/22 1615 12/15/22 1630 12/15/22 1645 12/15/22 1700  BP: 119/81 117/76 119/82 121/81  Pulse: 79 80 79 88  Resp: 19 20 19 20  $ Temp:      TempSrc:      SpO2: 98% 98% 98% 99%  Weight:      Height:      PainSc:        Isolation Precautions No active isolations  Medications Medications  enoxaparin (LOVENOX) injection 40 mg (has no administration in time range)  0.9 %  sodium chloride infusion (has no administration in time range)  ceFEPIme (MAXIPIME) 2 g in sodium chloride 0.9 % 100 mL IVPB (has no administration in time range)    And  metroNIDAZOLE (FLAGYL)  IVPB  500 mg (has no administration in time range)  acetaminophen (TYLENOL) tablet 1,000 mg (has no administration in time range)  ibuprofen (ADVIL) tablet 600 mg (has no administration in time range)  oxyCODONE (Oxy IR/ROXICODONE) immediate release tablet 5-10 mg (has no administration in time range)  morphine (PF) 2 MG/ML injection 2 mg (has no administration in time range)  methocarbamol (ROBAXIN) tablet 500 mg (has no administration in time range)    Or  methocarbamol (ROBAXIN) 500 mg in dextrose 5 % 50 mL IVPB (has no administration in time range)  melatonin tablet 3 mg (has no administration in time range)  diphenhydrAMINE (BENADRYL) capsule 25 mg (has no administration in time range)    Or  diphenhydrAMINE (BENADRYL) injection 25 mg (has no administration in time range)  docusate sodium (COLACE) capsule 100 mg (has no administration in time range)  ondansetron (ZOFRAN-ODT) disintegrating tablet 4 mg (has no administration in time range)    Or  ondansetron (ZOFRAN) injection 4 mg (has no administration in time range)  simethicone (MYLICON) chewable tablet 40 mg (has no administration in time range)  hydrALAZINE (APRESOLINE) injection 10 mg (has no administration in time range)  sodium chloride 0.9 % bolus 1,000 mL (0 mLs Intravenous Stopped 12/15/22 1446)  ondansetron (ZOFRAN) injection 4 mg (4 mg Intravenous Given 12/15/22 1306)  HYDROmorphone (DILAUDID) injection 0.5 mg (0.5 mg Intravenous Given 12/15/22 1305)  iohexol (OMNIPAQUE) 300 MG/ML solution 100 mL (100 mLs Intravenous Contrast Given 12/15/22 1355)  HYDROmorphone (DILAUDID) injection 1 mg (1 mg Intravenous Given 12/15/22 1417)  ceFEPIme (MAXIPIME) 2 g in sodium chloride 0.9 % 100 mL IVPB (0 g Intravenous Stopped 12/15/22 1449)    And  metroNIDAZOLE (FLAGYL) IVPB 500 mg (0 mg Intravenous Stopped 12/15/22 1654)    Mobility walks     Focused Assessments     R Recommendations: See Admitting Provider Note  Report given to:    Additional Notes:

## 2022-12-15 NOTE — ED Notes (Signed)
Pt states relief in pain and nausea

## 2022-12-15 NOTE — ED Triage Notes (Signed)
Patient arrives in wheelchair with significant other by POV c/o lower abdominal pain and nausea onset of last night about 11pm. Patient reports having colon surgery last month. Patient also having back pain.

## 2022-12-16 ENCOUNTER — Observation Stay (HOSPITAL_COMMUNITY): Payer: No Typology Code available for payment source

## 2022-12-16 ENCOUNTER — Inpatient Hospital Stay (HOSPITAL_COMMUNITY): Payer: No Typology Code available for payment source

## 2022-12-16 DIAGNOSIS — Z888 Allergy status to other drugs, medicaments and biological substances status: Secondary | ICD-10-CM | POA: Diagnosis not present

## 2022-12-16 DIAGNOSIS — Z881 Allergy status to other antibiotic agents status: Secondary | ICD-10-CM | POA: Diagnosis not present

## 2022-12-16 DIAGNOSIS — Z880 Allergy status to penicillin: Secondary | ICD-10-CM | POA: Diagnosis not present

## 2022-12-16 DIAGNOSIS — K810 Acute cholecystitis: Secondary | ICD-10-CM | POA: Diagnosis present

## 2022-12-16 DIAGNOSIS — F32A Depression, unspecified: Secondary | ICD-10-CM | POA: Diagnosis present

## 2022-12-16 DIAGNOSIS — Z9049 Acquired absence of other specified parts of digestive tract: Secondary | ICD-10-CM | POA: Diagnosis not present

## 2022-12-16 DIAGNOSIS — Z932 Ileostomy status: Secondary | ICD-10-CM | POA: Diagnosis not present

## 2022-12-16 DIAGNOSIS — Z79899 Other long term (current) drug therapy: Secondary | ICD-10-CM | POA: Diagnosis not present

## 2022-12-16 HISTORY — PX: IR PERC CHOLECYSTOSTOMY: IMG2326

## 2022-12-16 LAB — COMPREHENSIVE METABOLIC PANEL
ALT: 33 U/L (ref 0–44)
AST: 30 U/L (ref 15–41)
Albumin: 3 g/dL — ABNORMAL LOW (ref 3.5–5.0)
Alkaline Phosphatase: 87 U/L (ref 38–126)
Anion gap: 9 (ref 5–15)
BUN: 10 mg/dL (ref 6–20)
CO2: 23 mmol/L (ref 22–32)
Calcium: 8.8 mg/dL — ABNORMAL LOW (ref 8.9–10.3)
Chloride: 103 mmol/L (ref 98–111)
Creatinine, Ser: 0.68 mg/dL (ref 0.61–1.24)
GFR, Estimated: >60 mL/min (ref >60)
Glucose, Bld: 129 mg/dL — ABNORMAL HIGH (ref 70–99)
Potassium: 3.5 mmol/L (ref 3.5–5.1)
Sodium: 135 mmol/L (ref 135–145)
Total Bilirubin: 1 mg/dL (ref 0.3–1.2)
Total Protein: 6.1 g/dL — ABNORMAL LOW (ref 6.5–8.1)

## 2022-12-16 LAB — CBC
HCT: 34.3 % — ABNORMAL LOW (ref 39.0–52.0)
Hemoglobin: 10.7 g/dL — ABNORMAL LOW (ref 13.0–17.0)
MCH: 27.5 pg (ref 26.0–34.0)
MCHC: 31.2 g/dL (ref 30.0–36.0)
MCV: 88.2 fL (ref 80.0–100.0)
Platelets: 324 10*3/uL (ref 150–400)
RBC: 3.89 MIL/uL — ABNORMAL LOW (ref 4.22–5.81)
RDW: 13.8 % (ref 11.5–15.5)
WBC: 20.3 10*3/uL — ABNORMAL HIGH (ref 4.0–10.5)
nRBC: 0 % (ref 0.0–0.2)

## 2022-12-16 MED ORDER — ESCITALOPRAM OXALATE 20 MG PO TABS
20.0000 mg | ORAL_TABLET | Freq: Every day | ORAL | Status: DC
  Administered 2022-12-16 – 2022-12-18 (×3): 20 mg via ORAL
  Filled 2022-12-16 (×3): qty 1

## 2022-12-16 MED ORDER — SODIUM CHLORIDE 0.9% FLUSH
5.0000 mL | Freq: Three times a day (TID) | INTRAVENOUS | Status: DC
  Administered 2022-12-16 – 2022-12-18 (×5): 5 mL

## 2022-12-16 MED ORDER — MIDAZOLAM HCL 2 MG/2ML IJ SOLN
INTRAMUSCULAR | Status: AC | PRN
  Administered 2022-12-16 (×2): 1 mg via INTRAVENOUS
  Administered 2022-12-16: 1.5 mg via INTRAVENOUS
  Administered 2022-12-16: .5 mg via INTRAVENOUS

## 2022-12-16 MED ORDER — MIDAZOLAM HCL 2 MG/2ML IJ SOLN
INTRAMUSCULAR | Status: AC
  Filled 2022-12-16: qty 2

## 2022-12-16 MED ORDER — FENTANYL CITRATE (PF) 100 MCG/2ML IJ SOLN
INTRAMUSCULAR | Status: AC
  Filled 2022-12-16: qty 2

## 2022-12-16 MED ORDER — DIPHENHYDRAMINE HCL 50 MG/ML IJ SOLN
INTRAMUSCULAR | Status: AC | PRN
  Administered 2022-12-16: 50 mg via INTRAVENOUS

## 2022-12-16 MED ORDER — MORPHINE SULFATE (PF) 4 MG/ML IV SOLN: 3.0000 mg | Freq: Once | INTRAVENOUS | Status: AC

## 2022-12-16 MED ORDER — MORPHINE SULFATE (PF) 4 MG/ML IV SOLN
INTRAVENOUS | Status: AC
  Administered 2022-12-16: 3 mg via INTRAVENOUS
  Filled 2022-12-16: qty 1

## 2022-12-16 MED ORDER — IOHEXOL 300 MG/ML  SOLN
50.0000 mL | Freq: Once | INTRAMUSCULAR | Status: AC | PRN
  Administered 2022-12-16: 5 mL

## 2022-12-16 MED ORDER — TECHNETIUM TC 99M MEBROFENIN IV KIT
5.5000 | PACK | Freq: Once | INTRAVENOUS | Status: AC
  Administered 2022-12-16: 5.5 via INTRAVENOUS

## 2022-12-16 MED ORDER — FENTANYL CITRATE (PF) 100 MCG/2ML IJ SOLN
INTRAMUSCULAR | Status: AC | PRN
  Administered 2022-12-16 (×2): 50 ug via INTRAVENOUS

## 2022-12-16 MED ORDER — LIDOCAINE-EPINEPHRINE 1 %-1:100000 IJ SOLN
INTRAMUSCULAR | Status: AC
  Administered 2022-12-16: 7 mL via INTRADERMAL
  Filled 2022-12-16: qty 1

## 2022-12-16 MED ORDER — DIPHENHYDRAMINE HCL 50 MG/ML IJ SOLN
INTRAMUSCULAR | Status: AC
  Filled 2022-12-16: qty 1

## 2022-12-16 NOTE — Progress Notes (Signed)
Subjective: CC: Intermittent RUQ abdominal pain throughout the night. No other areas of abdominal pain. Ongoing nausea. No vomiting. Having ostomy output. Voiding. Has not been mobilizing.   Tmax 100.3. Tachycardia resolved. No hypotension.  WBC 17.6 > 20.3. LFT's wnl.   Objective: Vital signs in last 24 hours: Temp:  [98.3 F (36.8 C)-100.3 F (37.9 C)] 99.5 F (37.5 C) (02/15 0553) Pulse Rate:  [67-113] 84 (02/15 0553) Resp:  [15-20] 17 (02/15 0553) BP: (113-148)/(69-87) 119/77 (02/15 0553) SpO2:  [96 %-100 %] 99 % (02/15 0553) Weight:  [81.2 kg] 81.2 kg (02/14 1150)    Intake/Output from previous day: 02/14 0701 - 02/15 0700 In: 2350.2 [I.V.:1062.1; IV Piggyback:1288.2] Out: 400 [Urine:400] Intake/Output this shift: No intake/output data recorded.  PE: Gen:  Alert, NAD, pleasant Card:  Reg Pulm:  CTAB, no W/R/R, effort normal Abd: Soft, ND, he does have some RUQ ttp but is otherwise NT. Equivocal Murphy's sign. Prior surgical incisions healing well. Right sided ileostomy with stool in bag. Unable to see stoma 2/2 stool but appeared budded and viable yesterday. +BS.  Ext:  No LE edema  Psych: A&Ox3   Lab Results:  Recent Labs    12/15/22 1307 12/16/22 0447  WBC 17.6* 20.3*  HGB 11.3* 10.7*  HCT 36.5* 34.3*  PLT 375 324   BMET Recent Labs    12/15/22 1307 12/16/22 0447  NA 135 135  K 3.8 3.5  CL 103 103  CO2 25 23  GLUCOSE 134* 129*  BUN 14 10  CREATININE 0.75 0.68  CALCIUM 9.3 8.8*   PT/INR No results for input(s): "LABPROT", "INR" in the last 72 hours. CMP     Component Value Date/Time   NA 135 12/16/2022 0447   K 3.5 12/16/2022 0447   CL 103 12/16/2022 0447   CO2 23 12/16/2022 0447   GLUCOSE 129 (H) 12/16/2022 0447   BUN 10 12/16/2022 0447   CREATININE 0.68 12/16/2022 0447   CALCIUM 8.8 (L) 12/16/2022 0447   PROT 6.1 (L) 12/16/2022 0447   ALBUMIN 3.0 (L) 12/16/2022 0447   AST 30 12/16/2022 0447   ALT 33 12/16/2022 0447    ALKPHOS 87 12/16/2022 0447   BILITOT 1.0 12/16/2022 0447   GFRNONAA >60 12/16/2022 0447   GFRAA >90 06/20/2014 0908   Lipase     Component Value Date/Time   LIPASE 68 (H) 12/15/2022 1307    Studies/Results: CT Abdomen Pelvis W Contrast  Result Date: 12/15/2022 CLINICAL DATA:  Abdominal pain. Postop bowel obstruction suspected. Recent ileostomy. EXAM: CT ABDOMEN AND PELVIS WITH CONTRAST TECHNIQUE: Multidetector CT imaging of the abdomen and pelvis was performed using the standard protocol following bolus administration of intravenous contrast. RADIATION DOSE REDUCTION: This exam was performed according to the departmental dose-optimization program which includes automated exposure control, adjustment of the mA and/or kV according to patient size and/or use of iterative reconstruction technique. CONTRAST:  110m OMNIPAQUE IOHEXOL 300 MG/ML  SOLN COMPARISON:  11/21/2022 FINDINGS: Lower chest: Mild patchy persistent atelectasis in the lower lungs, improved since the prior study. Hepatobiliary: No liver parenchymal lesion. Distended gallbladder with possible wall thickening and surrounding inflammatory change. Tiny stone previously seen dependent in the gallbladder is no longer visualized. Mild increase in prominence of the common bile duct. I do not identify ductal stone. Pancreas: Normal Spleen: Normal Adrenals/Urinary Tract: Adrenal glands are normal. Kidneys are normal. Bladder is normal. Stomach/Bowel: Stomach appears normal. Ileostomy in the right lower quadrant appears uncomplicated. No sign  of bowel obstruction. Edematous changes so she aided with the ascending colon near the hepatic flexure is favored to be secondary to the gallbladder inflammatory disease. Vascular/Lymphatic: Aorta and IVC are normal.  No adenopathy. Reproductive: Normal other than an enlarged prostate gland. Other: Less ascites than seen previously. No evidence of loculation/abscess Musculoskeletal: Negative IMPRESSION: 1.  Distended gallbladder with wall thickening and surrounding inflammatory change. Tiny stone previously seen dependent in the gallbladder is no longer visualized. Mild increase in prominence of the common bile duct. I do not identify a ductal stone however. Findings are suggestive of acute cholecystitis. Regional inflammation is also seen associated with the ascending colon near the hepatic flexure. I favor that this is secondary from the gallbladder inflammation but cannot rule out the possibility of colitis being the primary abnormality in this case, with regional inflammation simulating cholecystitis. Unless the clinical picture is compelling, one could consider nuclear medicine hepatobiliary scan to try to clarify this differential diagnosis. 2. Ileostomy in the right lower quadrant appears uncomplicated. No sign of bowel obstruction. 3. Less ascites than seen previously. No evidence of loculation/abscess. 4. Mild patchy atelectasis in the lower lungs, improved since the prior study. 5. Enlarged prostate gland. Electronically Signed   By: Nelson Chimes M.D.   On: 12/15/2022 14:19    Anti-infectives: Anti-infectives (From admission, onward)    Start     Dose/Rate Route Frequency Ordered Stop   12/15/22 2200  ceFEPIme (MAXIPIME) 2 g in sodium chloride 0.9 % 100 mL IVPB       See Hyperspace for full Linked Orders Report.   2 g 200 mL/hr over 30 Minutes Intravenous Every 8 hours 12/15/22 1652 12/22/22 2159   12/15/22 2200  metroNIDAZOLE (FLAGYL) IVPB 500 mg       See Hyperspace for full Linked Orders Report.   500 mg 100 mL/hr over 60 Minutes Intravenous Every 12 hours 12/15/22 1652 12/22/22 2159   12/15/22 1430  ceFEPIme (MAXIPIME) 2 g in sodium chloride 0.9 % 100 mL IVPB       See Hyperspace for full Linked Orders Report.   2 g 200 mL/hr over 30 Minutes Intravenous  Once 12/15/22 1427 12/15/22 1449   12/15/22 1430  metroNIDAZOLE (FLAGYL) IVPB 500 mg       See Hyperspace for full Linked Orders  Report.   500 mg 100 mL/hr over 60 Minutes Intravenous  Once 12/15/22 1427 12/15/22 1654        Assessment/Plan Possible cholecystitis  47 y/o M with recent history of robotic LAR/CV fistula takedown for complicated diverticulitis 11/10/22 by Dr. Johney Maine complicated by anastomotic breakdown on POD#3 requiring diagnostic laparoscopy, washout of IAAx2, and diverting loop ileostomy on 11/13/2022. Presented with abdominal pain and nausea. CT scan w/ distended gallbladder with wall thickening and mild increase in prominence of the CBD. Also some inflammation of the ascending colon that radiology questions is reactive.  - Await HIDA. If this is positive, would recommend IR Perc Chole drain given how close he is out from his recent surgeries.  - No ductal stone on CT. LFT's are wnl. Continue to trend labs while here.  - Discussed case with Dr. Johney Maine this am.    FEN - NPO, IVF VTE - SCDs, Lovenox ID - Zosyn 2/14 >>    LOS: 0 days    Jillyn Ledger , [facility] 12/16/2022, 9:05 AM Please see Amion for pager number during day hours 7:00am-4:30pm

## 2022-12-16 NOTE — Progress Notes (Signed)
  Transition of Care [facility]) Screening Note   Patient Details  Name: ZAKAI GONYEA Date of Birth: [birth year]   Transition of Care [facility]) CM/SW Contact:    Bethann Berkshire, [place] Phone Number: 12/16/2022, 4:08 PM    Transition of Care Department [facility]) has reviewed patient and no TOC needs have been identified at this time. We will continue to monitor patient advancement through interdisciplinary progression rounds. If new patient transition needs arise, please place a TOC consult.

## 2022-12-16 NOTE — Procedures (Signed)
Interventional Radiology Procedure Note  Procedure: Image guided percutaneous cholecystotomy tube placement  Findings: Please refer to procedural dictation for full description. 10 Fr intercostal, transperitoneal cholecystotomy tube placement.  Dark bilious output.    Complications: None immediate  Estimated Blood Loss: < 5 mL  Recommendations: Keep to bulb suction for now. Follow up cultures. IR will follow.   Ruthann Cancer, MD

## 2022-12-16 NOTE — Consult Note (Signed)
Chief Complaint: Patient was seen in consultation today for acute cholecystitis at the request of Saverio Danker, PA  Referring Physician(s): Saverio Danker, PA  Supervising Physician: Ruthann Cancer  Patient Status: [facility] - In-pt  History of Present Illness: Carlos Stone is a 47 y.o. male who presented to the ED 12/15/22 c/o abdominal pain with nausea, chills and dry heaving. He reported at that time that he underwent colon reconstruction 11/10/22 and emergent ileostomy 11/13/22 due to findings significant for fluid and gas surrounding anastomotic site and pneumoperitoneum. He was d/c home 11/22/22. ED workup found WBC 17.6 and lipase 68. CT AP suggested findings of acute cholecystitis. Pt has been referred to IR for percutaneous cholecystomy. Imaging was reviewed and approved by Dr. Serafina Royals.    Pt denies chills, SOB, CP, emesis, dizziness, HA oe weakness.  He endorses fever, RUQ tenderness, fatigue and nausea with dry heaves.  He is NPO per order.   Past Medical History:  Diagnosis Date   Anxiety disorder    Community acquired pneumonia 09/21/2013   Diverticulosis    with abcess 09/06/22   Meningitis    age 35    Past Surgical History:  Procedure Laterality Date   APPENDECTOMY  2005   COLOSTOMY N/A 11/13/2022   Procedure: COLOSTOMY;  Surgeon: [name], MD;  Location: [facility];  Service: General;  Laterality: N/A;   IR RADIOLOGIST EVAL & MGMT  07/23/2022   KNEE ARTHROSCOPY Right 2005   LAPAROSCOPY N/A 11/13/2022   Procedure: LAPAROSCOPY DIAGNOSTIC WASHOURT AND DRAINAGE OF INTRAABDOMINAL ABSCESSES X 2, DIVERTING LOOP ILEOSTOMY;  Surgeon: [name], MD;  Location: [facility];  Service: General;  Laterality: N/A;   PROCTOSCOPY N/A 11/10/2022   Procedure: RIGID PROCTOSCOPY;  Surgeon: [name], MD;  Location: [facility];  Service: General;  Laterality: N/A;    Allergies: Compazine [prochlorperazine edisylate], Amoxil [amoxicillin], Cipro [ciprofloxacin hcl], and  Penicillins  Medications: Prior to Admission medications   Medication Sig Start Date End Date Taking? Authorizing Provider  acetaminophen (TYLENOL) 325 MG tablet Take 2 tablets (650 mg total) by mouth every 6 (six) hours as needed for mild pain (or Fever >/= 101). Patient taking differently: Take 650 mg by mouth as needed for mild pain. 07/11/22  Yes Elgergawy, Silver Huguenin, MD  escitalopram (LEXAPRO) 20 MG tablet Take 20 mg by mouth daily. 02/23/21  Yes [provider]  ferrous sulfate 325 (65 FE) MG tablet Take 1 tablet (325 mg total) by mouth 2 (two) times daily with a meal. Patient taking differently: Take 325 mg by mouth daily. 11/22/22  Yes [name], MD  ibuprofen (ADVIL) 200 MG tablet Take 600 mg by mouth as needed.   Yes [provider]  methocarbamol (ROBAXIN) 500 MG tablet Take 500 mg by mouth as needed for muscle spasms. 12/14/22  Yes [provider]  Multiple Vitamins-Minerals (MULTIVITAMIN WITH MINERALS) tablet Take 2 tablets by mouth daily.   Yes [provider]  ondansetron (ZOFRAN) 4 MG tablet Take 1 tablet (4 mg total) by mouth every 6 (six) hours as needed for nausea or vomiting (Use Zofran (ondansetron) 1st). Patient taking differently: Take 4 mg by mouth as needed for nausea or vomiting. 11/22/22  Yes Gross, Remo Lipps, MD  Wheat Dextrin (BENEFIBER PO) Take 15 mLs by mouth daily.   Yes [provider]     History reviewed. No pertinent family history.  Social History   Socioeconomic History   Marital status: Married    Spouse name: Not on  file   Number of children: Not on file   Years of education: Not on file   Highest education level: Not on file  Occupational History   Not on file  Tobacco Use   Smoking status: Never   Smokeless tobacco: Never  Vaping Use   Vaping Use: Never used  Substance and Sexual Activity   Alcohol use: Yes    Alcohol/week: 1.0 standard drink of alcohol    Types: 1 Glasses of wine per week     Comment: occasional   Drug use: No   Sexual activity: Yes  Other Topics Concern   Not on file  Social History Narrative   Not on file   Social Determinants of Health   Financial Resource Strain: Not on file  Food Insecurity: No Food Insecurity (12/16/2022)   Hunger Vital Sign    Worried About Running Out of Food in the Last Year: Never true    Ran Out of Food in the Last Year: Never true  Transportation Needs: No Transportation Needs (12/16/2022)   PRAPARE - Hydrologist (Medical): No    Lack of Transportation (Non-Medical): No  Physical Activity: Not on file  Stress: Not on file  Social Connections: Not on file    Review of Systems: A 12 point ROS discussed and pertinent positives are indicated in the HPI above.  All other systems are negative.  Review of Systems  Constitutional:  Positive for fatigue and fever. Negative for chills.  Respiratory:  Negative for shortness of breath.   Cardiovascular:  Negative for chest pain.  Gastrointestinal:  Positive for abdominal pain and nausea. Negative for vomiting.  Neurological:  Negative for dizziness, weakness and headaches.    Vital Signs: BP 122/78 (BP Location: Right Arm)   Pulse 86   Temp 99.2 F (37.3 C)   Resp 17   Ht 5' 9"$  (1.753 m)   Wt 179 lb (81.2 kg)   SpO2 99%   BMI 26.43 kg/m   Code status: Full code   Physical Exam Vitals reviewed.  Constitutional:      General: He is not in acute distress.    Appearance: Normal appearance. He is not ill-appearing.  HENT:     Head: Normocephalic and atraumatic.  Eyes:     Extraocular Movements: Extraocular movements intact.     Conjunctiva/sclera: Conjunctivae normal.  Cardiovascular:     Rate and Rhythm: Normal rate and regular rhythm.     Pulses: Normal pulses.     Heart sounds: Normal heart sounds. No murmur heard. Pulmonary:     Effort: Pulmonary effort is normal. No respiratory distress.     Breath sounds: Normal breath sounds.   Abdominal:     General: Abdomen is flat. Bowel sounds are normal. There is no distension.     Palpations: Abdomen is soft.     Tenderness: There is abdominal tenderness. There is no guarding.     Comments: Ileostomy in place  Musculoskeletal:     Right lower leg: No edema.     Left lower leg: No edema.  Skin:    General: Skin is warm and dry.  Neurological:     Mental Status: He is alert and oriented to person, place, and time.  Psychiatric:        Mood and Affect: Mood normal.        Thought Content: Thought content normal.        Judgment: Judgment normal.  Imaging: NM Hepatobiliary Liver Func  Result Date: 12/16/2022 CLINICAL DATA:  Abdominal pain with nausea and vomiting. Distended gallbladder with surrounding inflammation on CT, suspicious for cholecystitis. EXAM: NUCLEAR MEDICINE HEPATOBILIARY IMAGING TECHNIQUE: Sequential images of the abdomen were obtained out to 60 minutes following intravenous administration of radiopharmaceutical. RADIOPHARMACEUTICALS:  5.5 mCi Tc-67m Choletec IV COMPARISON:  Abdominopelvic CT 12/15/2022 and 11/21/2022. FINDINGS: Initial images demonstrate prompt uptake and biliary excretion of activity by the liver. Biliary activity passes into the small bowel, consistent with patency of the common bile duct. No spontaneous opacifications of the gallbladder lumen. 3 mg of intravenous morphine sulfate was administered intravenously and additional imaging was performed through 30 minutes. Post morphine, no gallbladder opacification identified. Findings are consistent with cystic duct obstruction and support the diagnosis of acute cholecystitis. No evidence of bile leak. IMPRESSION: No gallbladder opacification despite morphine sulfate consistent with cystic duct obstruction and acute cholecystitis. Patent common bile duct. Electronically Signed   By: WRichardean SaleM.D.   On: 12/16/2022 12:25   CT Abdomen Pelvis W Contrast  Result Date: 12/15/2022 CLINICAL  DATA:  Abdominal pain. Postop bowel obstruction suspected. Recent ileostomy. EXAM: CT ABDOMEN AND PELVIS WITH CONTRAST TECHNIQUE: Multidetector CT imaging of the abdomen and pelvis was performed using the standard protocol following bolus administration of intravenous contrast. RADIATION DOSE REDUCTION: This exam was performed according to the departmental dose-optimization program which includes automated exposure control, adjustment of the mA and/or kV according to patient size and/or use of iterative reconstruction technique. CONTRAST:  102mOMNIPAQUE IOHEXOL 300 MG/ML  SOLN COMPARISON:  11/21/2022 FINDINGS: Lower chest: Mild patchy persistent atelectasis in the lower lungs, improved since the prior study. Hepatobiliary: No liver parenchymal lesion. Distended gallbladder with possible wall thickening and surrounding inflammatory change. Tiny stone previously seen dependent in the gallbladder is no longer visualized. Mild increase in prominence of the common bile duct. I do not identify ductal stone. Pancreas: Normal Spleen: Normal Adrenals/Urinary Tract: Adrenal glands are normal. Kidneys are normal. Bladder is normal. Stomach/Bowel: Stomach appears normal. Ileostomy in the right lower quadrant appears uncomplicated. No sign of bowel obstruction. Edematous changes so she aided with the ascending colon near the hepatic flexure is favored to be secondary to the gallbladder inflammatory disease. Vascular/Lymphatic: Aorta and IVC are normal.  No adenopathy. Reproductive: Normal other than an enlarged prostate gland. Other: Less ascites than seen previously. No evidence of loculation/abscess Musculoskeletal: Negative IMPRESSION: 1. Distended gallbladder with wall thickening and surrounding inflammatory change. Tiny stone previously seen dependent in the gallbladder is no longer visualized. Mild increase in prominence of the common bile duct. I do not identify a ductal stone however. Findings are suggestive of acute  cholecystitis. Regional inflammation is also seen associated with the ascending colon near the hepatic flexure. I favor that this is secondary from the gallbladder inflammation but cannot rule out the possibility of colitis being the primary abnormality in this case, with regional inflammation simulating cholecystitis. Unless the clinical picture is compelling, one could consider nuclear medicine hepatobiliary scan to try to clarify this differential diagnosis. 2. Ileostomy in the right lower quadrant appears uncomplicated. No sign of bowel obstruction. 3. Less ascites than seen previously. No evidence of loculation/abscess. 4. Mild patchy atelectasis in the lower lungs, improved since the prior study. 5. Enlarged prostate gland. Electronically Signed   By: MaNelson Chimes.D.   On: 12/15/2022 14:19   CT ABDOMEN PELVIS W CONTRAST  Result Date: 11/21/2022 CLINICAL DATA:  Postoperative abdominal pain, status  post partial colectomy and reanastomosis nausea EXAM: CT ABDOMEN AND PELVIS WITH CONTRAST TECHNIQUE: Multidetector CT imaging of the abdomen and pelvis was performed using the standard protocol following bolus administration of intravenous contrast. RADIATION DOSE REDUCTION: This exam was performed according to the departmental dose-optimization program which includes automated exposure control, adjustment of the mA and/or kV according to patient size and/or use of iterative reconstruction technique. CONTRAST:  134m OMNIPAQUE IOHEXOL 300 MG/ML  SOLN COMPARISON:  11/13/2022 FINDINGS: Lower chest: Elevation of the right hemidiaphragm. Small bilateral pleural effusions and associated atelectasis or consolidation, right-greater-than-left. Hepatobiliary: No solid liver abnormality is seen. No gallstones, gallbladder wall thickening, or biliary dilatation. Pancreas: Unremarkable. No pancreatic ductal dilatation or surrounding inflammatory changes. Spleen: Normal in size without significant abnormality.  Adrenals/Urinary Tract: Adrenal glands are unremarkable. Kidneys are normal, without renal calculi, solid lesion, or hydronephrosis. Bladder is unremarkable. Stomach/Bowel: Stomach is within normal limits. Status post interval right lower quadrant diverting loop ileostomy. Status post prior sigmoid colon resection and anastomosis. Circumferential wall thickening of the rectum (series 4, image 97). Vascular/Lymphatic: Scattered aortic atherosclerosis. No enlarged abdominal or pelvic lymph nodes. Reproductive: Prostatomegaly. Other: Right lower quadrant diverting ileostomy. Small volume ascites throughout the abdomen and pelvis. Some of this fluid is loculated appearing, for example in the left lower quadrant (series 4, image 65). Interval placement of a surgical drain, tip in the low left hemipelvis. Musculoskeletal: No acute or significant osseous findings. IMPRESSION: 1. Status post interval right lower quadrant diverting loop ileostomy. 2. Small volume ascites throughout the abdomen and pelvis. Some of this fluid is loculated appearing, for example in the left lower quadrant. 3. Status post prior sigmoid colon resection and anastomosis. 4. Circumferential wall thickening of the rectum, consistent with nonspecific infectious or inflammatory proctitis. 5. Small bilateral pleural effusions and associated atelectasis or consolidation, right-greater-than-left. Aortic Atherosclerosis (ICD10-I70.0). Electronically Signed   By: ADelanna AhmadiM.D.   On: 11/21/2022 12:59   DG Abd Portable 1V  Result Date: 11/16/2022 CLINICAL DATA:  Nasogastric tube placement EXAM: PORTABLE ABDOMEN - 1 VIEW COMPARISON:  CT abdomen 11/13/2022 FINDINGS: The nasogastric tube tip is in the descending duodenum. If gastric positioning is desired, consider retracting 10 cm. Tubing projects over the lower abdomen. Small bowel loops are dilated up to 5.3 cm in diameter, with an abnormal but nonspecific appearance which could reflect ileus or  obstruction. IMPRESSION: 1. Nasogastric tube tip is in the descending duodenum. If gastric positioning is desired, consider retracting 10 cm. 2. Abnormal but nonspecific bowel gas pattern with dilated small bowel loops up to 5.3 cm in diameter. Electronically Signed   By: WVan ClinesM.D.   On: 11/16/2022 19:05    Labs:  CBC: Recent Labs    11/19/22 0439 11/20/22 0019 12/15/22 1307 12/16/22 0447  WBC 14.2* 15.3* 17.6* 20.3*  HGB 8.8* 8.6* 11.3* 10.7*  HCT 27.9* 27.5* 36.5* 34.3*  PLT 427* 442* 375 324    COAGS: Recent Labs    07/08/22 0447  INR 1.2    BMP: Recent Labs    11/14/22 0242 11/15/22 0333 11/20/22 0019 11/21/22 0616 11/22/22 0503 12/15/22 1307 12/16/22 0447  NA 135  --  144  --   --  135 135  K 4.2   < > 3.5 3.6 4.1 3.8 3.5  CL 103  --  112*  --   --  103 103  CO2 26  --  26  --   --  25 23  GLUCOSE 168*  --  115*  --   --  134* 129*  BUN 19  --  30*  --   --  14 10  CALCIUM 8.2*  --  8.1*  --   --  9.3 8.8*  CREATININE 0.84   < > 0.72 0.59*  --  0.75 0.68  GFRNONAA >60   < > >60 >60  --  >60 >60   < > = values in this interval not displayed.    LIVER FUNCTION TESTS: Recent Labs    07/07/22 1140 11/20/22 0019 12/15/22 1307 12/16/22 0447  BILITOT 1.2 0.4 0.9 1.0  AST 40 14* 18 30  ALT 57* 16 19 33  ALKPHOS 102 49 93 87  PROT 7.1 5.2* 7.8 6.1*  ALBUMIN 3.9 2.5* 4.1 3.0*    TUMOR MARKERS: No results for input(s): "AFPTM", "CEA", "CA199", "CHROMGRNA" in the last 8760 hours.  Assessment and Plan:  47 yo male with PMH of anxiety and diverticulitis presents to IR for percutaneous cholecystostomy drain for acute cholecystitis.   Pt resting in bed with wife at bedside.  He is A&O and calm. He is in no distress.   Risks and benefits of percutaneous cholecystotomy drain with moderate sedation discussed with the patient including, but not limited to bleeding, infection, gallbladder perforation, bile leak, sepsis or even death.  All of the  patient's questions were answered, patient is agreeable to proceed. Consent signed and in chart. Thank you for this interesting consult.  I greatly enjoyed meeting KASSIAN BURNET and look forward to participating in their care.  A copy of this report was sent to the requesting provider on this date.  Electronically Signed: Tyson Alias, NP 12/16/2022, 2:39 PM   I spent a total of n face to face in clinical consultation, greater than 50% of which was counseling/coordinating care for acute cholecystitis.

## 2022-12-17 LAB — COMPREHENSIVE METABOLIC PANEL
ALT: 31 U/L (ref 0–44)
AST: 20 U/L (ref 15–41)
Albumin: 2.7 g/dL — ABNORMAL LOW (ref 3.5–5.0)
Alkaline Phosphatase: 102 U/L (ref 38–126)
Anion gap: 11 (ref 5–15)
BUN: 15 mg/dL (ref 6–20)
CO2: 21 mmol/L — ABNORMAL LOW (ref 22–32)
Calcium: 8.7 mg/dL — ABNORMAL LOW (ref 8.9–10.3)
Chloride: 108 mmol/L (ref 98–111)
Creatinine, Ser: 0.78 mg/dL (ref 0.61–1.24)
GFR, Estimated: >60 mL/min (ref >60)
Glucose, Bld: 89 mg/dL (ref 70–99)
Potassium: 3.5 mmol/L (ref 3.5–5.1)
Sodium: 140 mmol/L (ref 135–145)
Total Bilirubin: 0.8 mg/dL (ref 0.3–1.2)
Total Protein: 5.6 g/dL — ABNORMAL LOW (ref 6.5–8.1)

## 2022-12-17 LAB — CBC
HCT: 31.1 % — ABNORMAL LOW (ref 39.0–52.0)
Hemoglobin: 9.4 g/dL — ABNORMAL LOW (ref 13.0–17.0)
MCH: 27.2 pg (ref 26.0–34.0)
MCHC: 30.2 g/dL (ref 30.0–36.0)
MCV: 90.1 fL (ref 80.0–100.0)
Platelets: 303 10*3/uL (ref 150–400)
RBC: 3.45 MIL/uL — ABNORMAL LOW (ref 4.22–5.81)
RDW: 14.1 % (ref 11.5–15.5)
WBC: 11 10*3/uL — ABNORMAL HIGH (ref 4.0–10.5)
nRBC: 0 % (ref 0.0–0.2)

## 2022-12-17 NOTE — Progress Notes (Signed)
Subjective: CC: S/p IR perc chole drain yesterday. Pain that he had on admission has resolved. Now just soreness around the drain. Pain well controlled with po medications. Tolerating cld but having some nausea requiring zofran. No vomiting.  Having ostomy output. Voiding. Has not been oob today.   Afebrile. Tachycardia resolved. No hypotension. WBC down at 11 from 20.3 yesterday. CMP pending. Drain w/ 65cc since placement. Cx pending.   Objective: Vital signs in last 24 hours: Temp:  [98.6 F (37 C)-99.6 F (37.6 C)] 98.6 F (37 C) (02/16 0606) Pulse Rate:  [78-103] 78 (02/16 0606) Resp:  [14-22] 16 (02/16 0606) BP: (105-123)/(68-78) 108/68 (02/16 0606) SpO2:  [95 %-100 %] 96 % (02/16 0606) Last BM Date : 12/16/22  Intake/Output from previous day: 02/15 0701 - 02/16 0700 In: 2917.8 [P.O.:300; I.V.:2027.9; IV Piggyback:590] Out: 65 [Drains:65] Intake/Output this shift: No intake/output data recorded.  PE: Gen:  Alert, NAD, pleasant Card:  Reg Pulm:  CTA b/l, normal rate and effort  Abd: Soft, ND, some ttp around IR drain but otherwise NT. Prior surgical incisions healing well. Right sided ileostomy viable stoma and bile tinged liquid stool in bag. +BS.  IR drain w/ bilious output in bulb.  Psych: A&Ox3   Lab Results:  Recent Labs    12/16/22 0447 12/17/22 0814  WBC 20.3* 11.0*  HGB 10.7* 9.4*  HCT 34.3* 31.1*  PLT 324 303   BMET Recent Labs    12/15/22 1307 12/16/22 0447  NA 135 135  K 3.8 3.5  CL 103 103  CO2 25 23  GLUCOSE 134* 129*  BUN 14 10  CREATININE 0.75 0.68  CALCIUM 9.3 8.8*   PT/INR No results for input(s): "LABPROT", "INR" in the last 72 hours. CMP     Component Value Date/Time   NA 135 12/16/2022 0447   K 3.5 12/16/2022 0447   CL 103 12/16/2022 0447   CO2 23 12/16/2022 0447   GLUCOSE 129 (H) 12/16/2022 0447   BUN 10 12/16/2022 0447   CREATININE 0.68 12/16/2022 0447   CALCIUM 8.8 (L) 12/16/2022 0447   PROT 6.1 (L) 12/16/2022  0447   ALBUMIN 3.0 (L) 12/16/2022 0447   AST 30 12/16/2022 0447   ALT 33 12/16/2022 0447   ALKPHOS 87 12/16/2022 0447   BILITOT 1.0 12/16/2022 0447   GFRNONAA >60 12/16/2022 0447   GFRAA >90 06/20/2014 0908   Lipase     Component Value Date/Time   LIPASE 68 (H) 12/15/2022 1307    Studies/Results: IR Perc Cholecystostomy  Result Date: 12/16/2022 INDICATION: 47 year old male with history of acute, presumed acalculous cholecystitis. 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 EXAM: Ultrasound fluoroscopic guided cholecystostomy tube placement. MEDICATIONS: The patient was receiving multiple antibiotics as an inpatient, administered in appropriate time frame prior to the start of this procedure. No additional antibiotics were administered. 50 mg Benadryl, intravenous ANESTHESIA/SEDATION: Moderate (conscious) sedation was employed during this procedure. A total of Versed 4 mg and Fentanyl 100 mcg was administered intravenously. Moderate Sedation Time: 10 minutes. The patient's level of consciousness and vital signs were monitored continuously by radiology nursing throughout the procedure under my direct supervision. FLUOROSCOPY TIME:  Seven mGy COMPLICATIONS: None immediate. PROCEDURE: Informed written consent was obtained from the patient after a thorough discussion of the procedural risks, benefits and alternatives. All questions were addressed. Maximal Sterile Barrier Technique was utilized including caps, mask, sterile gowns, sterile gloves, sterile drape, hand hygiene and  skin antiseptic. A timeout was performed prior to the initiation of the procedure. The patient was placed supine on the angiographic table. The patient's right upper quadrant was then prepped and draped in normal sterile fashion with maximum sterile barrier. Ultrasound demonstrates a distended gallbladder. Subdermal Local anesthesia was provided at the planned skin entry site.  Under ultrasound guidance, deeper local anesthetic was provided through intercostal muscles and along the liver capsule. Ultrasound was used to puncture the gallbladder using an 18 gauge trocar needle via a intercostal, trans peritoneal approach with visualization of the lung treated to the gallbladder. A 0.035 inch exchange wire was placed in the tract was dilated. A 10.2 French multipurpose drainage catheter was advanced into the gallbladder lumen. The drain was then secured in place using a 0-silk suture and a Stayfix device. A sterile dressing was applied. The tube was placed to bulb suction. A culture was sent to the lab for analysis. The patient tolerated procedure well without evidence of immediate complication was transferred back to the floor in stable condition. IMPRESSION: Successful placement of percutaneous, intercostal, transperitoneal cholecystostomy tube. Ruthann Cancer, MD [facility] Radiology Electronically Signed   By: Ruthann Cancer M.D.   On: 12/16/2022 17:12   NM Hepatobiliary Liver Func  Result Date: 12/16/2022 CLINICAL DATA:  Abdominal pain with nausea and vomiting. Distended gallbladder with surrounding inflammation on CT, suspicious for cholecystitis. EXAM: NUCLEAR MEDICINE HEPATOBILIARY IMAGING TECHNIQUE: Sequential images of the abdomen were obtained out to 60 minutes following intravenous administration of radiopharmaceutical. RADIOPHARMACEUTICALS:  5.5 mCi Tc-46m Choletec IV COMPARISON:  Abdominopelvic CT 12/15/2022 and 11/21/2022. FINDINGS: Initial images demonstrate prompt uptake and biliary excretion of activity by the liver. Biliary activity passes into the small bowel, consistent with patency of the common bile duct. No spontaneous opacifications of the gallbladder lumen. 3 mg of intravenous morphine sulfate was administered intravenously and additional imaging was performed through 30 minutes. Post morphine, no gallbladder  opacification identified. Findings are consistent with cystic duct obstruction and support the diagnosis of acute cholecystitis. No evidence of bile leak. IMPRESSION: No gallbladder opacification despite morphine sulfate consistent with cystic duct obstruction and acute cholecystitis. Patent common bile duct. Electronically Signed   By: WRichardean SaleM.D.   On: 12/16/2022 12:25   CT Abdomen Pelvis W Contrast  Result Date: 12/15/2022 CLINICAL DATA:  Abdominal pain. Postop bowel obstruction suspected. Recent ileostomy. EXAM: CT ABDOMEN AND PELVIS WITH CONTRAST TECHNIQUE: Multidetector CT imaging of the abdomen and pelvis was performed using the standard protocol following bolus administration of intravenous contrast. RADIATION DOSE REDUCTION: This exam was performed according to the departmental dose-optimization program which includes automated exposure control, adjustment of the mA and/or kV according to patient size and/or use of iterative reconstruction technique. CONTRAST:  1031mOMNIPAQUE IOHEXOL 300 MG/ML  SOLN COMPARISON:  11/21/2022 FINDINGS: Lower chest: Mild patchy persistent atelectasis in the lower lungs, improved since the prior study. Hepatobiliary: No liver parenchymal lesion. Distended gallbladder with possible wall thickening and surrounding inflammatory change. Tiny stone previously seen dependent in the gallbladder is no longer visualized. Mild increase in prominence of the common bile duct. I do not identify ductal stone. Pancreas: Normal Spleen: Normal Adrenals/Urinary Tract: Adrenal glands are normal. Kidneys are normal. Bladder is normal. Stomach/Bowel: Stomach appears normal. Ileostomy in the right lower quadrant appears uncomplicated. No sign of bowel obstruction. Edematous changes so she aided with the ascending colon near the hepatic flexure is favored to be secondary to the gallbladder inflammatory  disease. Vascular/Lymphatic: Aorta and IVC are normal.  No adenopathy. Reproductive:  Normal other than an enlarged prostate gland. Other: Less ascites than seen previously. No evidence of loculation/abscess Musculoskeletal: Negative IMPRESSION: 1. Distended gallbladder with wall thickening and surrounding inflammatory change. Tiny stone previously seen dependent in the gallbladder is no longer visualized. Mild increase in prominence of the common bile duct. I do not identify a ductal stone however. Findings are suggestive of acute cholecystitis. Regional inflammation is also seen associated with the ascending colon near the hepatic flexure. I favor that this is secondary from the gallbladder inflammation but cannot rule out the possibility of colitis being the primary abnormality in this case, with regional inflammation simulating cholecystitis. Unless the clinical picture is compelling, one could consider nuclear medicine hepatobiliary scan to try to clarify this differential diagnosis. 2. Ileostomy in the right lower quadrant appears uncomplicated. No sign of bowel obstruction. 3. Less ascites than seen previously. No evidence of loculation/abscess. 4. Mild patchy atelectasis in the lower lungs, improved since the prior study. 5. Enlarged prostate gland. Electronically Signed   By: Nelson Chimes M.D.   On: 12/15/2022 14:19    Anti-infectives: Anti-infectives (From admission, onward)    Start     Dose/Rate Route Frequency Ordered Stop   12/15/22 2200  ceFEPIme (MAXIPIME) 2 g in sodium chloride 0.9 % 100 mL IVPB       See Hyperspace for full Linked Orders Report.   2 g 200 mL/hr over 30 Minutes Intravenous Every 8 hours 12/15/22 1652 12/22/22 2159   12/15/22 2200  metroNIDAZOLE (FLAGYL) IVPB 500 mg       See Hyperspace for full Linked Orders Report.   500 mg 100 mL/hr over 60 Minutes Intravenous Every 12 hours 12/15/22 1652 12/22/22 2159   12/15/22 1430  ceFEPIme (MAXIPIME) 2 g in sodium chloride 0.9 % 100 mL IVPB       See Hyperspace for full Linked Orders Report.   2 g 200 mL/hr  over 30 Minutes Intravenous  Once 12/15/22 1427 12/15/22 1449   12/15/22 1430  metroNIDAZOLE (FLAGYL) IVPB 500 mg       See Hyperspace for full Linked Orders Report.   500 mg 100 mL/hr over 60 Minutes Intravenous  Once 12/15/22 1427 12/15/22 1654        Assessment/Plan Acute cholecystitis  History of robotic LAR/CV fistula takedown for complicated diverticulitis 11/10/22 by Dr. Johney Maine complicated by anastomotic breakdown on POD#3 requiring diagnostic laparoscopy, washout of IAAx2, and diverting loop ileostomy on 11/13/2022.  - S/p Perc Chole by IR 2/15. Drain flushes per their team. Cont abx x 4d. Cx pending. - No ductal stone on CT. LFT's were wnl yesterday, pending today.  - Adv diet.  - Cont mobilization and pulm toilet - Will check this pm if stable for d/c. Will arrange f/u with Dr. Johney Maine. Will also need f/u with IR. Flushes per their team. Will ask RN to teach him drain care. Discussed discharge instructions, restrictions and return/call back precautions.    FEN - FLD, ADAT to Soft, SLIV VTE - SCDs, Lovenox ID - Cefepime/Flagyl 2/14 >> Afebrile. WBC downtrending at 11.   I reviewed nursing notes, Consultant (IR) notes, last 24 h vitals and pain scores, last 48 h intake and output, last 24 h labs and trends, and last 24 h imaging results.   LOS: 1 day    Jillyn Ledger , [facility] 12/17/2022, 9:14 AM Please see Amion for pager number during day  hours 7:00am-4:30pm

## 2022-12-17 NOTE — Progress Notes (Signed)
Referring Physician(s): Stechschulte,P  Supervising Physician: Ruthann Cancer  Patient Status:  Carlos Stone - In-pt  Chief Complaint:  Abdominal pain, cholecystitis  Subjective: Pt with some RUQ tenderness at GB drain site, occ nausea   Allergies: Compazine [prochlorperazine edisylate], Amoxil [amoxicillin], Cipro [ciprofloxacin hcl], and Penicillins  Medications: Prior to Admission medications   Medication Sig Start Date End Date Taking? Authorizing Provider  acetaminophen (TYLENOL) 325 MG tablet Take 2 tablets (650 mg total) by mouth every 6 (six) hours as needed for mild pain (or Fever >/= 101). Patient taking differently: Take 650 mg by mouth as needed for mild pain. 07/11/22  Yes Elgergawy, Silver Huguenin, MD  escitalopram (LEXAPRO) 20 MG tablet Take 20 mg by mouth daily. 02/23/21  Yes [provider]  ferrous sulfate 325 (65 FE) MG tablet Take 1 tablet (325 mg total) by mouth 2 (two) times daily with a meal. Patient taking differently: Take 325 mg by mouth daily. 11/22/22  Yes [name], MD  ibuprofen (ADVIL) 200 MG tablet Take 600 mg by mouth as needed.   Yes [provider]  methocarbamol (ROBAXIN) 500 MG tablet Take 500 mg by mouth as needed for muscle spasms. 12/14/22  Yes [provider]  Multiple Vitamins-Minerals (MULTIVITAMIN WITH MINERALS) tablet Take 2 tablets by mouth daily.   Yes [provider]  ondansetron (ZOFRAN) 4 MG tablet Take 1 tablet (4 mg total) by mouth every 6 (six) hours as needed for nausea or vomiting (Use Zofran (ondansetron) 1st). Patient taking differently: Take 4 mg by mouth as needed for nausea or vomiting. 11/22/22  Yes Gross, Remo Lipps, MD  Wheat Dextrin (BENEFIBER PO) Take 15 mLs by mouth daily.   Yes [provider]     Vital Signs: BP 108/68 (BP Location: Right Arm)   Pulse 78   Temp 98.6 F (37 C) (Oral)   Resp 16   Ht 5' 9"$  (1.753 m)   Wt 179 lb (81.2 kg)   SpO2 96%   BMI 26.43 kg/m   Physical  Exam awake, flat affect; GB drain intact, insertion site ok, mildly tender, OP 65 cc yesterday, about 10 cc green bile in bulb at present; drain flushed without difficulty  Imaging: IR Perc Cholecystostomy  Result Date: 12/16/2022 INDICATION: 47 year old male with history of acute, presumed acalculous cholecystitis. 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 EXAM: Ultrasound fluoroscopic guided cholecystostomy tube placement. MEDICATIONS: The patient was receiving multiple antibiotics as an inpatient, administered in appropriate time frame prior to the start of this procedure. No additional antibiotics were administered. 50 mg Benadryl, intravenous ANESTHESIA/SEDATION: Moderate (conscious) sedation was employed during this procedure. A total of Versed 4 mg and Fentanyl 100 mcg was administered intravenously. Moderate Sedation Time: 10 minutes. The patient's level of consciousness and vital signs were monitored continuously by radiology nursing throughout the procedure under my direct supervision. FLUOROSCOPY TIME:  Seven mGy COMPLICATIONS: None immediate. PROCEDURE: Informed written consent was obtained from the patient after a thorough discussion of the procedural risks, benefits and alternatives. All questions were addressed. Maximal Sterile Barrier Technique was utilized including caps, mask, sterile gowns, sterile gloves, sterile drape, hand hygiene and skin antiseptic. A timeout was performed prior to the initiation of the procedure. The patient was placed supine on the angiographic table. The patient's right upper quadrant was then prepped and draped in normal sterile fashion with maximum sterile barrier. Ultrasound demonstrates a distended gallbladder. Subdermal Local anesthesia was provided at the planned skin  entry site. Under ultrasound guidance, deeper local anesthetic was provided through intercostal muscles and along the liver capsule. Ultrasound  was used to puncture the gallbladder using an 18 gauge trocar needle via a intercostal, trans peritoneal approach with visualization of the lung treated to the gallbladder. A 0.035 inch exchange wire was placed in the tract was dilated. A 10.2 French multipurpose drainage catheter was advanced into the gallbladder lumen. The drain was then secured in place using a 0-silk suture and a Stayfix device. A sterile dressing was applied. The tube was placed to bulb suction. A culture was sent to the lab for analysis. The patient tolerated procedure well without evidence of immediate complication was transferred back to the floor in stable condition. IMPRESSION: Successful placement of percutaneous, intercostal, transperitoneal cholecystostomy tube. Ruthann Cancer, MD [facility] Radiology Electronically Signed   By: Ruthann Cancer M.D.   On: 12/16/2022 17:12   NM Hepatobiliary Liver Func  Result Date: 12/16/2022 CLINICAL DATA:  Abdominal pain with nausea and vomiting. Distended gallbladder with surrounding inflammation on CT, suspicious for cholecystitis. EXAM: NUCLEAR MEDICINE HEPATOBILIARY IMAGING TECHNIQUE: Sequential images of the abdomen were obtained out to 60 minutes following intravenous administration of radiopharmaceutical. RADIOPHARMACEUTICALS:  5.5 mCi Tc-88m Choletec IV COMPARISON:  Abdominopelvic CT 12/15/2022 and 11/21/2022. FINDINGS: Initial images demonstrate prompt uptake and biliary excretion of activity by the liver. Biliary activity passes into the small bowel, consistent with patency of the common bile duct. No spontaneous opacifications of the gallbladder lumen. 3 mg of intravenous morphine sulfate was administered intravenously and additional imaging was performed through 30 minutes. Post morphine, no gallbladder opacification identified. Findings are consistent with cystic duct obstruction and support the diagnosis of acute cholecystitis. No  evidence of bile leak. IMPRESSION: No gallbladder opacification despite morphine sulfate consistent with cystic duct obstruction and acute cholecystitis. Patent common bile duct. Electronically Signed   By: WRichardean SaleM.D.   On: 12/16/2022 12:25   CT Abdomen Pelvis W Contrast  Result Date: 12/15/2022 CLINICAL DATA:  Abdominal pain. Postop bowel obstruction suspected. Recent ileostomy. EXAM: CT ABDOMEN AND PELVIS WITH CONTRAST TECHNIQUE: Multidetector CT imaging of the abdomen and pelvis was performed using the standard protocol following bolus administration of intravenous contrast. RADIATION DOSE REDUCTION: This exam was performed according to the departmental dose-optimization program which includes automated exposure control, adjustment of the mA and/or kV according to patient size and/or use of iterative reconstruction technique. CONTRAST:  1071mOMNIPAQUE IOHEXOL 300 MG/ML  SOLN COMPARISON:  11/21/2022 FINDINGS: Lower chest: Mild patchy persistent atelectasis in the lower lungs, improved since the prior study. Hepatobiliary: No liver parenchymal lesion. Distended gallbladder with possible wall thickening and surrounding inflammatory change. Tiny stone previously seen dependent in the gallbladder is no longer visualized. Mild increase in prominence of the common bile duct. I do not identify ductal stone. Pancreas: Normal Spleen: Normal Adrenals/Urinary Tract: Adrenal glands are normal. Kidneys are normal. Bladder is normal. Stomach/Bowel: Stomach appears normal. Ileostomy in the right lower quadrant appears uncomplicated. No sign of bowel obstruction. Edematous changes so she aided with the ascending colon near the hepatic flexure is favored to be secondary to the gallbladder inflammatory disease. Vascular/Lymphatic: Aorta and IVC are normal.  No adenopathy. Reproductive: Normal other than an enlarged prostate gland. Other: Less ascites than seen previously. No evidence of loculation/abscess  Musculoskeletal: Negative IMPRESSION: 1. Distended gallbladder with wall thickening and surrounding inflammatory change. Tiny stone previously seen dependent in the gallbladder is no longer visualized.  Mild increase in prominence of the common bile duct. I do not identify a ductal stone however. Findings are suggestive of acute cholecystitis. Regional inflammation is also seen associated with the ascending colon near the hepatic flexure. I favor that this is secondary from the gallbladder inflammation but cannot rule out the possibility of colitis being the primary abnormality in this case, with regional inflammation simulating cholecystitis. Unless the clinical picture is compelling, one could consider nuclear medicine hepatobiliary scan to try to clarify this differential diagnosis. 2. Ileostomy in the right lower quadrant appears uncomplicated. No sign of bowel obstruction. 3. Less ascites than seen previously. No evidence of loculation/abscess. 4. Mild patchy atelectasis in the lower lungs, improved since the prior study. 5. Enlarged prostate gland. Electronically Signed   By: Nelson Chimes M.D.   On: 12/15/2022 14:19    Labs:  CBC: Recent Labs    11/20/22 0019 12/15/22 1307 12/16/22 0447 12/17/22 0814  WBC 15.3* 17.6* 20.3* 11.0*  HGB 8.6* 11.3* 10.7* 9.4*  HCT 27.5* 36.5* 34.3* 31.1*  PLT 442* 375 324 303    COAGS: Recent Labs    07/08/22 0447  INR 1.2    BMP: Recent Labs    11/20/22 0019 11/21/22 0616 11/22/22 0503 12/15/22 1307 12/16/22 0447 12/17/22 0814  NA 144  --   --  135 135 140  K 3.5 3.6 4.1 3.8 3.5 3.5  CL 112*  --   --  103 103 108  CO2 26  --   --  25 23 21*  GLUCOSE 115*  --   --  134* 129* 89  BUN 30*  --   --  14 10 15  $ CALCIUM 8.1*  --   --  9.3 8.8* 8.7*  CREATININE 0.72 0.59*  --  0.75 0.68 0.78  GFRNONAA >60 >60  --  >60 >60 >60    LIVER FUNCTION TESTS: Recent Labs    11/20/22 0019 12/15/22 1307 12/16/22 0447 12/17/22 0814  BILITOT 0.4 0.9  1.0 0.8  AST 14* 18 30 20  $ ALT 16 19 33 31  ALKPHOS 49 93 87 102  PROT 5.2* 7.8 6.1* 5.6*  ALBUMIN 2.5* 4.1 3.0* 2.7*    Assessment and Plan: Pt with hx acute cholecystitis, 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 Rowe Robert, PA-C 12/17/2022, 10:50 AM   I spent a total of 15 Minutes at the the patient's bedside AND on the patient's hospital floor or unit, greater than 50% of which was counseling/coordinating care for gallbladder drain    Patient ID: Carlos Stone, male   DOB: July 23, 1976, 47 y.o.   MRN: [id number]

## 2022-12-18 LAB — CBC
HCT: 30.6 % — ABNORMAL LOW (ref 39.0–52.0)
Hemoglobin: 9.5 g/dL — ABNORMAL LOW (ref 13.0–17.0)
MCH: 27.2 pg (ref 26.0–34.0)
MCHC: 31 g/dL (ref 30.0–36.0)
MCV: 87.7 fL (ref 80.0–100.0)
Platelets: 351 10*3/uL (ref 150–400)
RBC: 3.49 MIL/uL — ABNORMAL LOW (ref 4.22–5.81)
RDW: 13.7 % (ref 11.5–15.5)
WBC: 8.7 10*3/uL (ref 4.0–10.5)
nRBC: 0 % (ref 0.0–0.2)

## 2022-12-18 LAB — COMPREHENSIVE METABOLIC PANEL
ALT: 23 U/L (ref 0–44)
AST: 15 U/L (ref 15–41)
Albumin: 2.8 g/dL — ABNORMAL LOW (ref 3.5–5.0)
Alkaline Phosphatase: 98 U/L (ref 38–126)
Anion gap: 12 (ref 5–15)
BUN: 16 mg/dL (ref 6–20)
CO2: 21 mmol/L — ABNORMAL LOW (ref 22–32)
Calcium: 8.7 mg/dL — ABNORMAL LOW (ref 8.9–10.3)
Chloride: 105 mmol/L (ref 98–111)
Creatinine, Ser: 0.67 mg/dL (ref 0.61–1.24)
GFR, Estimated: >60 mL/min (ref >60)
Glucose, Bld: 81 mg/dL (ref 70–99)
Potassium: 3.3 mmol/L — ABNORMAL LOW (ref 3.5–5.1)
Sodium: 138 mmol/L (ref 135–145)
Total Bilirubin: 0.9 mg/dL (ref 0.3–1.2)
Total Protein: 5.7 g/dL — ABNORMAL LOW (ref 6.5–8.1)

## 2022-12-18 MED ORDER — CIPROFLOXACIN HCL 500 MG PO TABS: 500.0000 mg | ORAL_TABLET | Freq: Two times a day (BID) | ORAL | 0 refills | Status: DC

## 2022-12-18 MED ORDER — ONDANSETRON 4 MG PO TBDP: 4.0000 mg | ORAL_TABLET | Freq: Four times a day (QID) | ORAL | 2 refills | Status: DC | PRN

## 2022-12-18 MED ORDER — METRONIDAZOLE 500 MG PO TABS: 500.0000 mg | ORAL_TABLET | Freq: Two times a day (BID) | ORAL | 0 refills | Status: AC

## 2022-12-18 MED ORDER — CEFDINIR 300 MG PO CAPS: 300.0000 mg | ORAL_CAPSULE | Freq: Two times a day (BID) | ORAL | 0 refills | Status: DC

## 2022-12-18 NOTE — Progress Notes (Signed)
Subjective/Chief Complaint: Feels great, hungry and wants to go home   Objective: Vital signs in last 24 hours: Temp:  [98.6 F (37 C)-99.2 F (37.3 C)] 98.6 F (37 C) (02/17 0536) Pulse Rate:  [68-78] 68 (02/17 0536) Resp:  [16-18] 18 (02/17 0536) BP: (110-125)/(69-73) 112/69 (02/17 0536) SpO2:  [96 %-98 %] 98 % (02/17 0536) Last BM Date : 12/17/22  Intake/Output from previous day: 02/16 0701 - 02/17 0700 In: 49 [P.O.:180; IV Piggyback:400] Out: 835 [Urine:800; Drains:35] Intake/Output this shift: Total I/O In: 0  Out: 360 [Drains:10; Stool:350]  Ab soft nontender ileostomy functional drain with minimal bilious output  Lab Results:  Recent Labs    12/17/22 0814 12/18/22 0420  WBC 11.0* 8.7  HGB 9.4* 9.5*  HCT 31.1* 30.6*  PLT 303 351   BMET Recent Labs    12/17/22 0814 12/18/22 0420  NA 140 138  K 3.5 3.3*  CL 108 105  CO2 21* 21*  GLUCOSE 89 81  BUN 15 16  CREATININE 0.78 0.67  CALCIUM 8.7* 8.7*   PT/INR No results for input(s): "LABPROT", "INR" in the last 72 hours. ABG No results for input(s): "PHART", "HCO3" in the last 72 hours.  Invalid input(s): "PCO2", "PO2"  Studies/Results: IR Perc Cholecystostomy  Result Date: 12/16/2022 INDICATION: 47 year old male with history of acute, presumed acalculous cholecystitis. 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 EXAM: Ultrasound fluoroscopic guided cholecystostomy tube placement. MEDICATIONS: The patient was receiving multiple antibiotics as an inpatient, administered in appropriate time frame prior to the start of this procedure. No additional antibiotics were administered. 50 mg Benadryl, intravenous ANESTHESIA/SEDATION: Moderate (conscious) sedation was employed during this procedure. A total of Versed 4 mg and Fentanyl 100 mcg was administered intravenously. Moderate Sedation Time: 10 minutes. The patient's level of consciousness and vital signs  were monitored continuously by radiology nursing throughout the procedure under my direct supervision. FLUOROSCOPY TIME:  Seven mGy COMPLICATIONS: None immediate. PROCEDURE: Informed written consent was obtained from the patient after a thorough discussion of the procedural risks, benefits and alternatives. All questions were addressed. Maximal Sterile Barrier Technique was utilized including caps, mask, sterile gowns, sterile gloves, sterile drape, hand hygiene and skin antiseptic. A timeout was performed prior to the initiation of the procedure. The patient was placed supine on the angiographic table. The patient's right upper quadrant was then prepped and draped in normal sterile fashion with maximum sterile barrier. Ultrasound demonstrates a distended gallbladder. Subdermal Local anesthesia was provided at the planned skin entry site. Under ultrasound guidance, deeper local anesthetic was provided through intercostal muscles and along the liver capsule. Ultrasound was used to puncture the gallbladder using an 18 gauge trocar needle via a intercostal, trans peritoneal approach with visualization of the lung treated to the gallbladder. A 0.035 inch exchange wire was placed in the tract was dilated. A 10.2 French multipurpose drainage catheter was advanced into the gallbladder lumen. The drain was then secured in place using a 0-silk suture and a Stayfix device. A sterile dressing was applied. The tube was placed to bulb suction. A culture was sent to the lab for analysis. The patient tolerated procedure well without evidence of immediate complication was transferred back to the floor in stable condition. IMPRESSION: Successful placement of percutaneous, intercostal, transperitoneal cholecystostomy tube. Ruthann Cancer, MD [facility] Radiology Electronically Signed   By: Ruthann Cancer M.D.   On: 12/16/2022 17:12   NM Hepatobiliary Liver Func  Result Date:  12/16/2022 CLINICAL DATA:  Abdominal pain with nausea and vomiting. Distended gallbladder with surrounding inflammation on CT, suspicious for cholecystitis. EXAM: NUCLEAR MEDICINE HEPATOBILIARY IMAGING TECHNIQUE: Sequential images of the abdomen were obtained out to 60 minutes following intravenous administration of radiopharmaceutical. RADIOPHARMACEUTICALS:  5.5 mCi Tc-5m Choletec IV COMPARISON:  Abdominopelvic CT 12/15/2022 and 11/21/2022. FINDINGS: Initial images demonstrate prompt uptake and biliary excretion of activity by the liver. Biliary activity passes into the small bowel, consistent with patency of the common bile duct. No spontaneous opacifications of the gallbladder lumen. 3 mg of intravenous morphine sulfate was administered intravenously and additional imaging was performed through 30 minutes. Post morphine, no gallbladder opacification identified. Findings are consistent with cystic duct obstruction and support the diagnosis of acute cholecystitis. No evidence of bile leak. IMPRESSION: No gallbladder opacification despite morphine sulfate consistent with cystic duct obstruction and acute cholecystitis. Patent common bile duct. Electronically Signed   By: WRichardean SaleM.D.   On: 12/16/2022 12:25    Anti-infectives: Anti-infectives (From admission, onward)    Start     Dose/Rate Route Frequency Ordered Stop   12/15/22 2200  ceFEPIme (MAXIPIME) 2 g in sodium chloride 0.9 % 100 mL IVPB       See Hyperspace for full Linked Orders Report.   2 g 200 mL/hr over 30 Minutes Intravenous Every 8 hours 12/15/22 1652 12/22/22 2159   12/15/22 2200  metroNIDAZOLE (FLAGYL) IVPB 500 mg       See Hyperspace for full Linked Orders Report.   500 mg 100 mL/hr over 60 Minutes Intravenous Every 12 hours 12/15/22 1652 12/22/22 2159   12/15/22 1430  ceFEPIme (MAXIPIME) 2 g in sodium chloride 0.9 % 100 mL IVPB       See Hyperspace for full Linked Orders Report.   2 g 200 mL/hr over 30 Minutes Intravenous   Once 12/15/22 1427 12/15/22 1449   12/15/22 1430  metroNIDAZOLE (FLAGYL) IVPB 500 mg       See Hyperspace for full Linked Orders Report.   500 mg 100 mL/hr over 60 Minutes Intravenous  Once 12/15/22 1427 12/15/22 1654       Assessment/Plan: Cholecystitis s/p perc chole S/p LAR with anastomotic leak in January with loop ileostomy - wbc normal, doing much better, dc home if tolerates breakfast with f/u Dr GJohney Maine-oral abx- gs negative, cx ngtd   FEN - regular VTE - SCDs, Lovenox ID - max/flagyl  MRolm Bookbinder2/17/2024

## 2022-12-18 NOTE — Plan of Care (Signed)

## 2022-12-21 ENCOUNTER — Other Ambulatory Visit: Payer: Self-pay | Admitting: Surgery

## 2022-12-21 DIAGNOSIS — K5732 Diverticulitis of large intestine without perforation or abscess without bleeding: Secondary | ICD-10-CM

## 2022-12-21 DIAGNOSIS — Z932 Ileostomy status: Secondary | ICD-10-CM

## 2022-12-21 LAB — AEROBIC/ANAEROBIC CULTURE W GRAM STAIN (SURGICAL/DEEP WOUND)
Culture: NO GROWTH
Gram Stain: NONE SEEN

## 2022-12-21 NOTE — Discharge Summary (Addendum)
Patient ID: Carlos Stone [id number] February 07, 1976 47 y.o.  Admit date: 12/15/2022 Discharge date: 12/21/2022   Discharge Diagnosis Patient Active Problem List   Diagnosis Date Noted   Acute cholecystitis 12/15/2022   Ileostomy care ([place]) 12/14/2022   Ileostomy in place [facility]) 12/11/2022   Anxiety disorder 11/22/2022   Depressed state 11/22/2022   Diverticulitis 11/10/2022   Colonic diverticular abscess 07/07/2022   Diverticulitis with abscess s/p sigmoid colectomy 11/10/2022 07/07/2022   Elevated transaminase level 09/24/2013   Hypokalemia 09/22/2013   Pleuritic chest pain 09/22/2013    Consultants IR  Reason for Admission:  Carlos Stone is a 47 y.o. male who presented to the ED with abdominal pain.  Patient is known to our service.  He recently underwent a robotic LAR by Dr. Johney Maine on 1/10.  On 1/13 he was found to have fluid and gas surrounding the anastomotic site with significant pneumoperitoneum on CT scan. He was taken back to the OR by Dr. Johney Maine for diagnostic laparoscopy, washout and drainage of intra-abdominal abscess x 2 and diverting loop ileostomy.  IntraOp noted to have feculent peritonitis with interloop infraumbilical retroperitoneal abscess walled off by small bowel that tract to the right anterior colorectal anastomosis - epicenter of inflammation and phlegmon with suspicion for microperforation there.  Patiently ultimately discharged 1/22.  He reports he was doing well at home: tolerating diet without nausea or vomiting and having ileostomy output.  Over the weekend he began having some lower back pain, periumbilical abdominal pain, nausea and dry heaves.  This self resolved.  His pain recurred around 11:30 PM last night after eating and has been persistent since that time.  He denies any fevers at home but does report chills. Now having RUQ/LUQ pain that he thinks is from dry heaving. In the ED he was afebrile without hypotension.  Initially tachycardic that resolved  after IV fluids.  WBC 17.6.  LFTs within normal limits.  Lipase mildly elevated at 68.  It appears he has had mildly elevated lipase dating back to 9 years. CT w/ no obvious issues related to his recent surgery.  Ileostomy in the RLQ appears uncomplicated, no signs of SBO, less ascites than previous and no evidence of abscess.  However this showed distended gallbladder with wall thickening and surrounding inflammatory changes.  There is also some inflammation of the ascending colon near the hepatic flexure that radiology feels is secondary to gallbladder inflammation. We were asked to see.    He denies excessive alcohol use or history of pancreatitis in the past.  Denies prior history of gallstones.  Denies ever having symptoms similar to this aside from that as listed above. Other abdominal surgery includes open appendectomy in his 20's. He is not on blood thinners. Does not appear he is on any GLP-1 receptor agonist.   Procedures Dr. Serafina Royals - IR Perc Chole tube placement - 12/16/22  Hospital Course:  Patient presented as above. He was started on IV abx. HIDA confirmed Acute Cholecystitis. Underwent  IR Perc Chole tube placement on 2/15 as noted above. Diet was advanced and tolerated. He was felt stable for d/c on 2/17. Please see progress note from MD on 2/17. The office is arranging f/u with Dr. Johney Maine. He will also need f/u with IR for f/u cholangiogram before appointment with Dr. Johney Maine.   Physical Exam: Please see MD's note on day of discharge.   Allergies as of 12/18/2022       Reactions   Compazine [prochlorperazine Edisylate]  Other (See Comments)   Hallucinations   Amoxil [amoxicillin] Rash   Tolerated cefepime fine   Cipro [ciprofloxacin Hcl] Rash   Penicillins Rash        Medication List     TAKE these medications    acetaminophen 325 MG tablet Commonly known as: TYLENOL Take 2 tablets (650 mg total) by mouth every 6 (six) hours as needed for mild pain (or Fever >/=  101). What changed:  when to take this reasons to take this   BENEFIBER PO Take 15 mLs by mouth daily.   cefdinir 300 MG capsule Commonly known as: OMNICEF Take 1 capsule (300 mg total) by mouth 2 (two) times daily.   escitalopram 20 MG tablet Commonly known as: LEXAPRO Take 20 mg by mouth daily.   ferrous sulfate 325 (65 FE) MG tablet Take 1 tablet (325 mg total) by mouth 2 (two) times daily with a meal. What changed: when to take this   ibuprofen 200 MG tablet Commonly known as: ADVIL Take 600 mg by mouth as needed.   methocarbamol 500 MG tablet Commonly known as: ROBAXIN Take 500 mg by mouth as needed for muscle spasms.   metroNIDAZOLE 500 MG tablet Commonly known as: Flagyl Take 1 tablet (500 mg total) by mouth 2 (two) times daily for 5 days.   multivitamin with minerals tablet Take 2 tablets by mouth daily.   ondansetron 4 MG disintegrating tablet Commonly known as: ZOFRAN-ODT Take 1 tablet (4 mg total) by mouth every 6 (six) hours as needed for nausea.   ondansetron 4 MG tablet Commonly known as: ZOFRAN Take 1 tablet (4 mg total) by mouth every 6 (six) hours as needed for nausea or vomiting (Use Zofran (ondansetron) 1st). What changed:  when to take this reasons to take this          Follow-up Information     Suttle, Rosanne Ashing, MD Follow up.   Specialties: Interventional Radiology, Diagnostic Radiology, Radiology Why: For follow up of your percutaneous cholecystostomy tube Contact information: [place] [id number] [phone]         Carlos Wery Boston, MD Follow up.   Specialties: General Surgery, Colon and Rectal Surgery Why: Please call to confirm your appointment date/time, Bring a copy of your photo ID & insurance card, Arrive 30 minutes prior to your appt for paperwork Contact information: [place] [phone]                 Signed: Alferd Apa,  [facility] Surgery 12/21/2022, 10:44 AM Please see Amion for pager number during day hours 7:00am-4:30pm

## 2022-12-30 ENCOUNTER — Other Ambulatory Visit: Payer: Self-pay | Admitting: Surgery

## 2022-12-30 DIAGNOSIS — K810 Acute cholecystitis: Secondary | ICD-10-CM

## 2023-01-03 ENCOUNTER — Ambulatory Visit
Admission: RE | Admit: 2023-01-03 | Discharge: 2023-01-03 | Disposition: A | Discharge status: Home / Self Care | Payer: PRIVATE HEALTH INSURANCE | Source: Ambulatory Visit | Attending: Surgery | Admitting: Surgery

## 2023-01-03 DIAGNOSIS — K810 Acute cholecystitis: Secondary | ICD-10-CM

## 2023-01-03 HISTORY — PX: IR RADIOLOGIST EVAL & MGMT: IMG5224

## 2023-01-03 MED ORDER — IOPAMIDOL (ISOVUE-300) INJECTION 61%
20.0000 mL | Freq: Once | INTRAVENOUS | Status: AC
  Administered 2023-01-03: 20 mL

## 2023-01-03 NOTE — Progress Notes (Signed)
Chief Complaint: Patient was seen in consultation today for cholecystostomy tube at the request of [facility]  Referring Physician(s): Gross,Steven  History of Present Illness: Carlos Stone is a 47 y.o. 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  He had a percutaneous cholecystostomy tube placed on 12/16/2022 and presents today for initial follow-up evaluation.  He has no significant clinical symptoms.  He is caring for his drain well and flushing it daily.  The adhesive fixation device has become somewhat loosened.  He has no severe pain, nausea, vomiting or other symptoms.  Drain injection was performed and there is a persistent partial filling defect in the gallbladder neck at the proximal cystic duct.  It is unclear if this represents sludge, stones or residual mucosal edema.  Past Medical History:  Diagnosis Date   Anxiety disorder    Community acquired pneumonia 09/21/2013   Diverticulosis    with abcess 09/06/22   Meningitis    age 19    Past Surgical History:  Procedure Laterality Date   APPENDECTOMY  2005   COLOSTOMY N/A 11/13/2022   Procedure: COLOSTOMY;  Surgeon: [name], MD;  Location: [facility];  Service: General;  Laterality: N/A;   IR PERC CHOLECYSTOSTOMY  12/16/2022   IR RADIOLOGIST EVAL & MGMT  07/23/2022   IR RADIOLOGIST EVAL & MGMT  01/03/2023   KNEE ARTHROSCOPY Right 2005   LAPAROSCOPY N/A 11/13/2022   Procedure: LAPAROSCOPY DIAGNOSTIC WASHOURT AND DRAINAGE OF INTRAABDOMINAL ABSCESSES X 2, DIVERTING LOOP ILEOSTOMY;  Surgeon: [name], MD;  Location: [facility];  Service: General;  Laterality: N/A;   PROCTOSCOPY N/A 11/10/2022   Procedure: RIGID PROCTOSCOPY;  Surgeon: [name], MD;  Location: [facility];  Service: General;  Laterality: N/A;    Allergies: Compazine [prochlorperazine edisylate], Amoxil [amoxicillin], Cipro  [ciprofloxacin hcl], and Penicillins  Medications: Prior to Admission medications   Medication Sig Start Date End Date Taking? Authorizing Provider  acetaminophen (TYLENOL) 325 MG tablet Take 2 tablets (650 mg total) by mouth every 6 (six) hours as needed for mild pain (or Fever >/= 101). Patient taking differently: Take 650 mg by mouth as needed for mild pain. 07/11/22   Elgergawy, Silver Huguenin, MD  cefdinir (OMNICEF) 300 MG capsule Take 1 capsule (300 mg total) by mouth 2 (two) times daily. 12/18/22   Rolm Bookbinder, MD  escitalopram (LEXAPRO) 20 MG tablet Take 20 mg by mouth daily. 02/23/21   [provider]  ferrous sulfate 325 (65 FE) MG tablet Take 1 tablet (325 mg total) by mouth 2 (two) times daily with a meal. Patient taking differently: Take 325 mg by mouth daily. 11/22/22   [name], MD  ibuprofen (ADVIL) 200 MG tablet Take 600 mg by mouth as needed.    [provider]  methocarbamol (ROBAXIN) 500 MG tablet Take 500 mg by mouth as needed for muscle spasms. 12/14/22   [provider]  Multiple Vitamins-Minerals (MULTIVITAMIN WITH MINERALS) tablet Take 2 tablets by mouth daily.    [provider]  ondansetron (ZOFRAN) 4 MG tablet Take 1 tablet (4 mg total) by mouth every 6 (six) hours as needed for nausea or vomiting (Use Zofran (ondansetron) 1st). Patient taking differently: Take 4 mg by mouth as needed for nausea or vomiting. 11/22/22   [name], MD  ondansetron (ZOFRAN-ODT) 4 MG disintegrating tablet Take 1 tablet (4 mg total) by mouth every 6 (six) hours as  needed for nausea. 12/18/22   Rolm Bookbinder, MD  Wheat Dextrin (BENEFIBER PO) Take 15 mLs by mouth daily.    [provider]     No family history on file.  Social History   Socioeconomic History   Marital status: Married    Spouse name: Not on file   Number of children: Not on file   Years of education: Not on file   Highest education level: Not on file  Occupational  History   Not on file  Tobacco Use   Smoking status: Never   Smokeless tobacco: Never  Vaping Use   Vaping Use: Never used  Substance and Sexual Activity   Alcohol use: Yes    Alcohol/week: 1.0 standard drink of alcohol    Types: 1 Glasses of wine per week    Comment: occasional   Drug use: No   Sexual activity: Yes  Other Topics Concern   Not on file  Social History Narrative   Not on file   Social Determinants of Health   Financial Resource Strain: Not on file  Food Insecurity: No Food Insecurity (12/16/2022)   Hunger Vital Sign    Worried About Running Out of Food in the Last Year: Never true    Ran Out of Food in the Last Year: Never true  Transportation Needs: No Transportation Needs (12/16/2022)   PRAPARE - Hydrologist (Medical): No    Lack of Transportation (Non-Medical): No  Physical Activity: Not on file  Stress: Not on file  Social Connections: Not on file    Review of Systems: A 12 point ROS discussed and pertinent positives are indicated in the HPI above.  All other systems are negative.  Review of Systems  Vital Signs: There were no vitals taken for this visit.    Physical Exam Constitutional:      Appearance: Normal appearance.  HENT:     Head: Normocephalic and atraumatic.  Eyes:     General: No scleral icterus. Cardiovascular:     Rate and Rhythm: Normal rate.  Pulmonary:     Effort: Pulmonary effort is normal.  Abdominal:     General: Abdomen is flat. There is no distension.     Palpations: Abdomen is soft.     Tenderness: There is no abdominal tenderness.    Skin:    General: Skin is warm and dry.  Neurological:     Mental Status: He is alert and oriented to person, place, and time.  Psychiatric:        Behavior: Behavior normal.       Imaging: IR Radiologist Eval & Mgmt  Result Date: 01/03/2023 EXAM: ESTABLISHED PATIENT OFFICE VISIT CHIEF COMPLAINT: SEE EPIC NOTE HISTORY OF PRESENT ILLNESS: SEE  EPIC NOTE REVIEW OF SYSTEMS: SEE EPIC NOTE PHYSICAL EXAMINATION: SEE EPIC NOTE ASSESSMENT AND PLAN: SEE EPIC NOTE Electronically Signed   By: Jacqulynn Cadet M.D.   On: 01/03/2023 14:54   IR Perc Cholecystostomy  Result Date: 12/16/2022 INDICATION: 47 year old male with history of acute, presumed acalculous cholecystitis. 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 EXAM: Ultrasound fluoroscopic guided cholecystostomy tube placement. MEDICATIONS: The patient was receiving multiple antibiotics as an inpatient, administered in appropriate time frame prior to the start of this procedure. No additional antibiotics were administered. 50 mg Benadryl, intravenous ANESTHESIA/SEDATION: Moderate (conscious) sedation was employed during this procedure. A total of Versed 4 mg and Fentanyl 100 mcg was administered intravenously. Moderate Sedation  Time: 10 minutes. The patient's level of consciousness and vital signs were monitored continuously by radiology nursing throughout the procedure under my direct supervision. FLUOROSCOPY TIME:  Seven mGy COMPLICATIONS: None immediate. PROCEDURE: Informed written consent was obtained from the patient after a thorough discussion of the procedural risks, benefits and alternatives. All questions were addressed. Maximal Sterile Barrier Technique was utilized including caps, mask, sterile gowns, sterile gloves, sterile drape, hand hygiene and skin antiseptic. A timeout was performed prior to the initiation of the procedure. The patient was placed supine on the angiographic table. The patient's right upper quadrant was then prepped and draped in normal sterile fashion with maximum sterile barrier. Ultrasound demonstrates a distended gallbladder. Subdermal Local anesthesia was provided at the planned skin entry site. Under ultrasound guidance, deeper local anesthetic was provided through intercostal muscles and along the liver capsule.  Ultrasound was used to puncture the gallbladder using an 18 gauge trocar needle via a intercostal, trans peritoneal approach with visualization of the lung treated to the gallbladder. A 0.035 inch exchange wire was placed in the tract was dilated. A 10.2 French multipurpose drainage catheter was advanced into the gallbladder lumen. The drain was then secured in place using a 0-silk suture and a Stayfix device. A sterile dressing was applied. The tube was placed to bulb suction. A culture was sent to the lab for analysis. The patient tolerated procedure well without evidence of immediate complication was transferred back to the floor in stable condition. IMPRESSION: Successful placement of percutaneous, intercostal, transperitoneal cholecystostomy tube. Ruthann Cancer, MD [facility] Electronically Signed   By: Ruthann Cancer M.D.   On: 12/16/2022 17:12   NM Hepatobiliary Liver Func  Result Date: 12/16/2022 CLINICAL DATA:  Abdominal pain with nausea and vomiting. Distended gallbladder with surrounding inflammation on CT, suspicious for cholecystitis. EXAM: NUCLEAR MEDICINE HEPATOBILIARY IMAGING TECHNIQUE: Sequential images of the abdomen were obtained out to 60 minutes following intravenous administration of radiopharmaceutical. RADIOPHARMACEUTICALS:  5.5 mCi Tc-52m Choletec IV COMPARISON:  Abdominopelvic CT 12/15/2022 and 11/21/2022. FINDINGS: Initial images demonstrate prompt uptake and biliary excretion of activity by the liver. Biliary activity passes into the small bowel, consistent with patency of the common bile duct. No spontaneous opacifications of the gallbladder lumen. 3 mg of intravenous morphine sulfate was administered intravenously and additional imaging was performed through 30 minutes. Post morphine, no gallbladder opacification identified. Findings are consistent with cystic duct obstruction and support the diagnosis of acute  cholecystitis. No evidence of bile leak. IMPRESSION: No gallbladder opacification despite morphine sulfate consistent with cystic duct obstruction and acute cholecystitis. Patent common bile duct. Electronically Signed   By: WRichardean SaleM.D.   On: 12/16/2022 12:25   CT Abdomen Pelvis W Contrast  Result Date: 12/15/2022 CLINICAL DATA:  Abdominal pain. Postop bowel obstruction suspected. Recent ileostomy. EXAM: CT ABDOMEN AND PELVIS WITH CONTRAST TECHNIQUE: Multidetector CT imaging of the abdomen and pelvis was performed using the standard protocol following bolus administration of intravenous contrast. RADIATION DOSE REDUCTION: This exam was performed according to the departmental dose-optimization program which includes automated exposure control, adjustment of the mA and/or kV according to patient size and/or use of iterative reconstruction technique. CONTRAST:  1039mOMNIPAQUE IOHEXOL 300 MG/ML  SOLN COMPARISON:  11/21/2022 FINDINGS: Lower chest: Mild patchy persistent atelectasis in the lower lungs, improved since the prior study. Hepatobiliary: No liver parenchymal lesion. Distended gallbladder with possible wall thickening and surrounding inflammatory change. Tiny stone previously seen dependent in the gallbladder  is no longer visualized. Mild increase in prominence of the common bile duct. I do not identify ductal stone. Pancreas: Normal Spleen: Normal Adrenals/Urinary Tract: Adrenal glands are normal. Kidneys are normal. Bladder is normal. Stomach/Bowel: Stomach appears normal. Ileostomy in the right lower quadrant appears uncomplicated. No sign of bowel obstruction. Edematous changes so she aided with the ascending colon near the hepatic flexure is favored to be secondary to the gallbladder inflammatory disease. Vascular/Lymphatic: Aorta and IVC are normal.  No adenopathy. Reproductive: Normal other than an enlarged prostate gland. Other: Less ascites than seen previously. No evidence of  loculation/abscess Musculoskeletal: Negative IMPRESSION: 1. Distended gallbladder with wall thickening and surrounding inflammatory change. Tiny stone previously seen dependent in the gallbladder is no longer visualized. Mild increase in prominence of the common bile duct. I do not identify a ductal stone however. Findings are suggestive of acute cholecystitis. Regional inflammation is also seen associated with the ascending colon near the hepatic flexure. I favor that this is secondary from the gallbladder inflammation but cannot rule out the possibility of colitis being the primary abnormality in this case, with regional inflammation simulating cholecystitis. Unless the clinical picture is compelling, one could consider nuclear medicine hepatobiliary scan to try to clarify this differential diagnosis. 2. Ileostomy in the right lower quadrant appears uncomplicated. No sign of bowel obstruction. 3. Less ascites than seen previously. No evidence of loculation/abscess. 4. Mild patchy atelectasis in the lower lungs, improved since the prior study. 5. Enlarged prostate gland. Electronically Signed   By: Nelson Chimes M.D.   On: 12/15/2022 14:19    Labs:  CBC: Recent Labs    12/15/22 1307 12/16/22 0447 12/17/22 0814 12/18/22 0420  WBC 17.6* 20.3* 11.0* 8.7  HGB 11.3* 10.7* 9.4* 9.5*  HCT 36.5* 34.3* 31.1* 30.6*  PLT 375 324 303 351    COAGS: Recent Labs    07/08/22 0447  INR 1.2    BMP: Recent Labs    12/15/22 1307 12/16/22 0447 12/17/22 0814 12/18/22 0420  NA 135 135 140 138  K 3.8 3.5 3.5 3.3*  CL 103 103 108 105  CO2 25 23 21* 21*  GLUCOSE 134* 129* 89 81  BUN '14 10 15 16  '$ CALCIUM 9.3 8.8* 8.7* 8.7*  CREATININE 0.75 0.68 0.78 0.67  GFRNONAA >60 >60 >60 >60    LIVER FUNCTION TESTS: Recent Labs    12/15/22 1307 12/16/22 0447 12/17/22 0814 12/18/22 0420  BILITOT 0.9 1.0 0.8 0.9  AST '18 30 20 15  '$ ALT 19 33 31 23  ALKPHOS 93 87 102 98  PROT 7.8 6.1* 5.6* 5.7*  ALBUMIN  4.1 3.0* 2.7* 2.8*    TUMOR MARKERS: No results for input(s): "AFPTM", "CEA", "CA199", "CHROMGRNA" in the last 8760 hours.  Assessment and Plan:  47 year old gentleman with a recent history of acute cholecystitis.  He presented today for initial drain injection.  While there is some filling of the cystic duct, there continues to be an ill-defined obstruction in the region of the gallbladder neck.  The cystic duct did not completely fill and there was no visualization of the common bile duct despite pressurizing the gallbladder lumen during the contrast injection.  Differential considerations include sludge, small stones or residual mucosal edema.  Malignancy is possible but considered significantly less likely.  Therefore, he is not ready to initiate a Trial.  Next follow-up will be in the hospital setting to allow for tube exchange.  1.) F/U in hospital in 4-6 weeks for tube check  and exchange.  Re-assess biliary patency at that time.    Electronically Signed: Criselda Peaches 01/03/2023, 2:58 PM   I spent a total of  15 Minutes in face to face in clinical consultation, greater than 50% of which was counseling/coordinating care for cholecystostomy tube in place.

## 2023-01-07 ENCOUNTER — Ambulatory Visit
Admission: RE | Admit: 2023-01-07 | Discharge: 2023-01-07 | Disposition: A | Discharge status: Home / Self Care | Payer: Medicaid Other | Source: Ambulatory Visit | Attending: Surgery | Admitting: Surgery

## 2023-01-07 ENCOUNTER — Other Ambulatory Visit: Payer: Self-pay | Admitting: Surgery

## 2023-01-07 DIAGNOSIS — Z932 Ileostomy status: Secondary | ICD-10-CM

## 2023-01-07 DIAGNOSIS — K5732 Diverticulitis of large intestine without perforation or abscess without bleeding: Secondary | ICD-10-CM

## 2023-01-17 ENCOUNTER — Ambulatory Visit: Payer: Self-pay | Admitting: Surgery

## 2023-01-17 ENCOUNTER — Ambulatory Visit
Admission: RE | Admit: 2023-01-17 | Discharge: 2023-01-17 | Disposition: A | Discharge status: Home / Self Care | Payer: No Typology Code available for payment source | Source: Ambulatory Visit | Attending: Surgery | Admitting: Surgery

## 2023-01-17 DIAGNOSIS — K5732 Diverticulitis of large intestine without perforation or abscess without bleeding: Secondary | ICD-10-CM

## 2023-01-17 DIAGNOSIS — Z932 Ileostomy status: Secondary | ICD-10-CM

## 2023-01-20 ENCOUNTER — Other Ambulatory Visit: Payer: Medicaid Other

## 2023-01-26 ENCOUNTER — Other Ambulatory Visit (HOSPITAL_COMMUNITY): Payer: Self-pay | Admitting: Interventional Radiology

## 2023-01-26 DIAGNOSIS — K810 Acute cholecystitis: Secondary | ICD-10-CM

## 2023-01-27 ENCOUNTER — Telehealth (HOSPITAL_COMMUNITY): Payer: Self-pay

## 2023-01-27 NOTE — Telephone Encounter (Signed)
Pt was scheduled for 4/10 for drain injection and exchange. He was a little confused bc he is having surgery to get his gallbladder removed the following week. I checked with our PA Manuela Schwartz to see if this exchange is still needed. She stated that if his drain is draining  and he feels fine then he does not need the exchange prior to surgery. Pt agreed with this. AB

## 2023-02-07 NOTE — Progress Notes (Addendum)
COVID Vaccine Completed:  No  Date of COVID positive in last 90 days:  No  PCP - Assunta Found, MD Cardiologist - N/A  Chest x-ray - N/A EKG - 11-05-22 Epic Stress Test - N/A ECHO - 09-24-13 Epic Cardiac Cath - N/A Pacemaker/ICD device last checked: Spinal Cord Stimulator:N/A  Bowel Prep - N/A  Sleep Study - N/A CPAP -   Fasting Blood Sugar - N/A Checks Blood Sugar _____ times a day  Last dose of GLP1 agonist-  N/A GLP1 instructions:  N/A   Last dose of SGLT-2 inhibitors-  N/A SGLT-2 instructions: N/A   Blood Thinner Instructions:N/A Aspirin Instructions: Last Dose:  Activity level:  Can go up a flight of stairs and perform activities of daily living without stopping and without symptoms of chest pain or shortness of breath.  Anesthesia review: N/A  Patient denies shortness of breath, fever, cough and chest pain at PAT appointment  Patient verbalized understanding of instructions that were given to them at the PAT appointment. Patient was also instructed that they will need to review over the PAT instructions again at home before surgery.

## 2023-02-07 NOTE — Patient Instructions (Addendum)
SURGICAL WAITING ROOM VISITATION  Patients having surgery or a procedure may have no more than 2 support people in the waiting area - these visitors may rotate.    Children under the age of 49 must have an adult with them who is not the patient.  Due to an increase in RSV and influenza rates and associated hospitalizations, children ages 4412 and under may not visit patients in [facility] hospitals.  If the patient needs to stay at the hospital during part of their recovery, the visitor guidelines for inpatient rooms apply. Pre-op nurse will coordinate an appropriate time for 1 support person to accompany patient in pre-op.  This support person may not rotate.    Please refer to the [facility] website for the visitor guidelines for Inpatients (after your surgery is over and you are in a regular room).       Your procedure is scheduled on: 02-16-23   Report to [facility] Main Entrance    Report to admitting at 10:15 AM   Call this number if you have problems the morning of surgery [phone]   Follow a clear liquid diet the day before surgery   After Midnight you may have the following liquids until 9:30 AM DAY OF SURGERY  Water Non-Citrus Juices (without pulp, NO RED-Apple, White grape, White cranberry) Black Coffee (NO MILK/CREAM OR CREAMERS, sugar ok)  Clear Tea (NO MILK/CREAM OR CREAMERS, sugar ok) regular and decaf                             Plain Jell-O (NO RED)                                           Fruit ices (not with fruit pulp, NO RED)                                     Popsicles (NO RED)                                                               Sports drinks like Gatorade (NO RED)              Drink 2 Ensure drinks the night before surgery (have completed by 10 PM).        The day of surgery:  Drink ONE (1) Pre-Surgery Clear Ensure at 9:30 AM the morning of surgery. Drink in one sitting. Do not sip.  This drink was given to you during your  hospital  pre-op appointment visit. Nothing else to drink after completing the Pre-Surgery Ensure          If you have questions, please contact your surgeon's office.   FOLLOW  ANY ADDITIONAL PRE OP INSTRUCTIONS YOU RECEIVED FROM YOUR SURGEON'S OFFICE!!!     Oral Hygiene is also important to reduce your risk of infection.  Remember - BRUSH YOUR TEETH THE MORNING OF SURGERY WITH YOUR REGULAR TOOTHPASTE  DENTURES WILL BE REMOVED PRIOR TO SURGERY PLEASE DO NOT APPLY "Poly grip" OR ADHESIVES!!!   Do NOT smoke after Midnight   Take these medicines the morning of surgery with A SIP OF WATER:   Escitalopram  Tylenol if needed                               You may not have any metal on your body including jewelry, and body piercing             Do not wear lotions, powders, cologne, or deodorant              Men may shave face and neck.   Do not bring valuables to the hospital. [facility] IS NOT  RESPONSIBLE   FOR VALUABLES.   Contacts, glasses, dentures or bridgework may not be worn into surgery.   Bring small overnight bag day of surgery.   DO NOT BRING YOUR HOME MEDICATIONS TO THE HOSPITAL. PHARMACY WILL DISPENSE MEDICATIONS LISTED ON YOUR MEDICATION LIST TO YOU DURING YOUR ADMISSION IN THE HOSPITAL!                Please read over the following fact sheets you were given: IF YOU HAVE QUESTIONS ABOUT YOUR PRE-OP INSTRUCTIONS PLEASE CALL [phone] [place]   If you received a COVID test during your pre-op visit  it is requested that you wear a mask when out in public, stay away from anyone that may not be feeling well and notify your surgeon if you develop symptoms. If you test positive for Covid or have been in contact with anyone that has tested positive in the last 10 days please notify you surgeon.    [facility] - Preparing for Surgery Before surgery, you can play an important role.  Because skin is not sterile, your skin needs to be as free  of germs as possible.  You can reduce the number of germs on your skin by washing with CHG (chlorahexidine gluconate) soap before surgery.  CHG is an antiseptic cleaner which kills germs and bonds with the skin to continue killing germs even after washing. Please DO NOT use if you have an allergy to CHG or antibacterial soaps.  If your skin becomes reddened/irritated stop using the CHG and inform your nurse when you arrive at Short Stay. Do not shave (including legs and underarms) for at least 48 hours prior to the first CHG shower.  You may shave your face/neck.  Please follow these instructions carefully:  1.  Shower with CHG Soap the night before surgery and the  morning of surgery.  2.  If you choose to wash your hair, wash your hair first as usual with your normal  shampoo.  3.  After you shampoo, rinse your hair and body thoroughly to remove the shampoo.                             4.  Use CHG as you would any other liquid soap.  You can apply chg directly to the skin and wash.  Gently with a scrungie or clean washcloth.  5.  Apply the CHG Soap to your body ONLY FROM THE NECK DOWN.   Do   not use on face/ open  Wound or open sores. Avoid contact with eyes, ears mouth and   genitals (private parts).                       Wash face,  Genitals (private parts) with your normal soap.             6.  Wash thoroughly, paying special attention to the area where your    surgery  will be performed.  7.  Thoroughly rinse your body with warm water from the neck down.  8.  DO NOT shower/wash with your normal soap after using and rinsing off the CHG Soap.                9.  Pat yourself dry with a clean towel.            10.  Wear clean pajamas.            11.  Place clean sheets on your bed the night of your first shower and do not  sleep with pets. Day of Surgery : Do not apply any lotions/deodorants the morning of surgery.  Please wear clean clothes to the hospital/surgery  center.  FAILURE TO FOLLOW THESE INSTRUCTIONS MAY RESULT IN THE CANCELLATION OF YOUR SURGERY  PATIENT SIGNATURE_________________________________  NURSE SIGNATURE__________________________________  ________________________________________________________________________  Adam Phenix  An incentive spirometer is a tool that can help keep your lungs clear and active. This tool measures how well you are filling your lungs with each breath. Taking long deep breaths may help reverse or decrease the chance of developing breathing (pulmonary) problems (especially infection) following: A long period of time when you are unable to move or be active. BEFORE THE PROCEDURE  If the spirometer includes an indicator to show your best effort, your nurse or respiratory therapist will set it to a desired goal. If possible, sit up straight or lean slightly forward. Try not to slouch. Hold the incentive spirometer in an upright position. INSTRUCTIONS FOR USE  Sit on the edge of your bed if possible, or sit up as far as you can in bed or on a chair. Hold the incentive spirometer in an upright position. Breathe out normally. Place the mouthpiece in your mouth and seal your lips tightly around it. Breathe in slowly and as deeply as possible, raising the piston or the ball toward the top of the column. Hold your breath for 3-5 seconds or for as long as possible. Allow the piston or ball to fall to the bottom of the column. Remove the mouthpiece from your mouth and breathe out normally. Rest for a few seconds and repeat Steps 1 through 7 at least 10 times every 1-2 hours when you are awake. Take your time and take a few normal breaths between deep breaths. The spirometer may include an indicator to show your best effort. Use the indicator as a goal to work toward during each repetition. After each set of 10 deep breaths, practice coughing to be sure your lungs are clear. If you have an incision (the cut  made at the time of surgery), support your incision when coughing by placing a pillow or rolled up towels firmly against it. Once you are able to get out of bed, walk around indoors and cough well. You may stop using the incentive spirometer when instructed by your caregiver.  RISKS AND COMPLICATIONS Take your time so you do not get dizzy or light-headed. If you are in pain, you may  need to take or ask for pain medication before doing incentive spirometry. It is harder to take a deep breath if you are having pain. AFTER USE Rest and breathe slowly and easily. It can be helpful to keep track of a log of your progress. Your caregiver can provide you with a simple table to help with this. If you are using the spirometer at home, follow these instructions: [place] IF:  You are having difficultly using the spirometer. You have trouble using the spirometer as often as instructed. Your pain medication is not giving enough relief while using the spirometer. You develop fever of 100.5 F (38.1 C) or higher. SEEK IMMEDIATE MEDICAL CARE IF:  You cough up bloody sputum that had not been present before. You develop fever of 102 F (38.9 C) or greater. You develop worsening pain at or near the incision site. MAKE SURE YOU:  Understand these instructions. Will watch your condition. Will get help right away if you are not doing well or get worse. Document Released: 02/28/2007 Document Revised: 01/10/2012 Document Reviewed: 05/01/2007 [facility] Patient Information 2014 [place], [place].   ________________________________________________________________________

## 2023-02-09 ENCOUNTER — Ambulatory Visit (HOSPITAL_COMMUNITY): Payer: No Typology Code available for payment source

## 2023-02-11 ENCOUNTER — Encounter (HOSPITAL_COMMUNITY)
Admission: RE | Admit: 2023-02-11 | Discharge: 2023-02-11 | Disposition: A | Discharge status: Home / Self Care | Payer: No Typology Code available for payment source | Source: Ambulatory Visit | Attending: Surgery | Admitting: Surgery

## 2023-02-11 ENCOUNTER — Other Ambulatory Visit: Payer: Self-pay

## 2023-02-11 ENCOUNTER — Encounter (HOSPITAL_COMMUNITY): Payer: Self-pay

## 2023-02-11 VITALS — BP 113/79 | HR 59 | Temp 98.3°F | Resp 16 | Ht 69.0 in | Wt 188.0 lb

## 2023-02-11 DIAGNOSIS — Z01812 Encounter for preprocedural laboratory examination: Secondary | ICD-10-CM | POA: Insufficient documentation

## 2023-02-11 DIAGNOSIS — Z01818 Encounter for other preprocedural examination: Secondary | ICD-10-CM

## 2023-02-11 HISTORY — DX: Other specified postprocedural states: Z98.890

## 2023-02-11 HISTORY — DX: Nausea with vomiting, unspecified: R11.2

## 2023-02-11 LAB — CBC
HCT: 45 % (ref 39.0–52.0)
Hemoglobin: 14 g/dL (ref 13.0–17.0)
MCH: 26.7 pg (ref 26.0–34.0)
MCHC: 31.1 g/dL (ref 30.0–36.0)
MCV: 85.9 fL (ref 80.0–100.0)
Platelets: 265 10*3/uL (ref 150–400)
RBC: 5.24 MIL/uL (ref 4.22–5.81)
RDW: 15 % (ref 11.5–15.5)
WBC: 7.5 10*3/uL (ref 4.0–10.5)
nRBC: 0 % (ref 0.0–0.2)

## 2023-02-15 ENCOUNTER — Encounter (HOSPITAL_COMMUNITY): Payer: Self-pay | Admitting: Surgery

## 2023-02-16 ENCOUNTER — Inpatient Hospital Stay (HOSPITAL_COMMUNITY)
Admission: RE | Admit: 2023-02-16 | Discharge: 2023-02-20 | DRG: 330 | Disposition: A | Discharge status: Home / Self Care | Payer: No Typology Code available for payment source | Attending: Surgery | Admitting: Surgery

## 2023-02-16 ENCOUNTER — Encounter (HOSPITAL_COMMUNITY)
Admission: RE | Disposition: A | Discharge status: Home / Self Care | Payer: Self-pay | Source: Home / Self Care | Attending: Surgery

## 2023-02-16 ENCOUNTER — Inpatient Hospital Stay (HOSPITAL_COMMUNITY): Payer: No Typology Code available for payment source

## 2023-02-16 ENCOUNTER — Inpatient Hospital Stay (HOSPITAL_COMMUNITY): Payer: No Typology Code available for payment source | Admitting: Anesthesiology

## 2023-02-16 ENCOUNTER — Encounter (HOSPITAL_COMMUNITY): Payer: Self-pay | Admitting: Surgery

## 2023-02-16 ENCOUNTER — Other Ambulatory Visit: Payer: Self-pay

## 2023-02-16 DIAGNOSIS — K567 Ileus, unspecified: Secondary | ICD-10-CM | POA: Diagnosis not present

## 2023-02-16 DIAGNOSIS — Z880 Allergy status to penicillin: Secondary | ICD-10-CM

## 2023-02-16 DIAGNOSIS — F419 Anxiety disorder, unspecified: Secondary | ICD-10-CM | POA: Diagnosis present

## 2023-02-16 DIAGNOSIS — Z881 Allergy status to other antibiotic agents status: Secondary | ICD-10-CM

## 2023-02-16 DIAGNOSIS — F32A Depression, unspecified: Secondary | ICD-10-CM | POA: Diagnosis present

## 2023-02-16 DIAGNOSIS — K6289 Other specified diseases of anus and rectum: Secondary | ICD-10-CM | POA: Diagnosis present

## 2023-02-16 DIAGNOSIS — Z432 Encounter for attention to ileostomy: Secondary | ICD-10-CM

## 2023-02-16 DIAGNOSIS — K5732 Diverticulitis of large intestine without perforation or abscess without bleeding: Secondary | ICD-10-CM | POA: Diagnosis present

## 2023-02-16 DIAGNOSIS — Z888 Allergy status to other drugs, medicaments and biological substances status: Secondary | ICD-10-CM | POA: Diagnosis not present

## 2023-02-16 DIAGNOSIS — K828 Other specified diseases of gallbladder: Secondary | ICD-10-CM | POA: Diagnosis present

## 2023-02-16 DIAGNOSIS — K810 Acute cholecystitis: Secondary | ICD-10-CM | POA: Diagnosis present

## 2023-02-16 DIAGNOSIS — K812 Acute cholecystitis with chronic cholecystitis: Secondary | ICD-10-CM | POA: Diagnosis present

## 2023-02-16 DIAGNOSIS — K5730 Diverticulosis of large intestine without perforation or abscess without bleeding: Secondary | ICD-10-CM

## 2023-02-16 DIAGNOSIS — Z79899 Other long term (current) drug therapy: Secondary | ICD-10-CM | POA: Diagnosis not present

## 2023-02-16 DIAGNOSIS — K819 Cholecystitis, unspecified: Secondary | ICD-10-CM

## 2023-02-16 DIAGNOSIS — K660 Peritoneal adhesions (postprocedural) (postinfection): Secondary | ICD-10-CM | POA: Diagnosis present

## 2023-02-16 DIAGNOSIS — K9189 Other postprocedural complications and disorders of digestive system: Secondary | ICD-10-CM | POA: Diagnosis not present

## 2023-02-16 DIAGNOSIS — K529 Noninfective gastroenteritis and colitis, unspecified: Secondary | ICD-10-CM | POA: Diagnosis present

## 2023-02-16 DIAGNOSIS — K5720 Diverticulitis of large intestine with perforation and abscess without bleeding: Secondary | ICD-10-CM | POA: Diagnosis present

## 2023-02-16 HISTORY — PX: RECTAL EXAM UNDER ANESTHESIA: SHX6399

## 2023-02-16 HISTORY — PX: FLEXIBLE SIGMOIDOSCOPY: SHX5431

## 2023-02-16 HISTORY — PX: LAPAROSCOPIC CHOLECYSTECTOMY SINGLE SITE WITH INTRAOPERATIVE CHOLANGIOGRAM: SHX6538

## 2023-02-16 HISTORY — PX: ILEOSTOMY CLOSURE: SHX1784

## 2023-02-16 SURGERY — CLOSURE, ILEOSTOMY
Anesthesia: General

## 2023-02-16 MED ORDER — ENOXAPARIN SODIUM 40 MG/0.4ML IJ SOSY
40.0000 mg | PREFILLED_SYRINGE | Freq: Once | INTRAMUSCULAR | Status: AC
  Administered 2023-02-16: 40 mg via SUBCUTANEOUS
  Filled 2023-02-16: qty 0.4

## 2023-02-16 MED ORDER — ENSURE SURGERY PO LIQD
237.0000 mL | Freq: Two times a day (BID) | ORAL | Status: DC
  Administered 2023-02-17: 237 mL via ORAL

## 2023-02-16 MED ORDER — HYDRALAZINE HCL 20 MG/ML IJ SOLN: 10.0000 mg | INTRAMUSCULAR | Status: DC | PRN

## 2023-02-16 MED ORDER — ENSURE PRE-SURGERY PO LIQD: 592.0000 mL | Freq: Once | ORAL | Status: DC

## 2023-02-16 MED ORDER — SUGAMMADEX SODIUM 200 MG/2ML IV SOLN
INTRAVENOUS | Status: DC | PRN
  Administered 2023-02-16: 200 mg via INTRAVENOUS

## 2023-02-16 MED ORDER — ACETAMINOPHEN 500 MG PO TABS
1000.0000 mg | ORAL_TABLET | Freq: Four times a day (QID) | ORAL | Status: DC
  Administered 2023-02-16 – 2023-02-20 (×7): 1000 mg via ORAL
  Filled 2023-02-16 (×9): qty 2

## 2023-02-16 MED ORDER — LIP MEDEX EX OINT
TOPICAL_OINTMENT | Freq: Two times a day (BID) | CUTANEOUS | Status: DC
  Administered 2023-02-17 – 2023-02-19 (×2): 75 via TOPICAL
  Administered 2023-02-20: 1 via TOPICAL
  Filled 2023-02-16 (×3): qty 7

## 2023-02-16 MED ORDER — SODIUM CHLORIDE 0.9 % IV SOLN: 250.0000 mL | INTRAVENOUS | Status: DC | PRN

## 2023-02-16 MED ORDER — MENTHOL 3 MG MT LOZG: 1.0000 | LOZENGE | OROMUCOSAL | Status: DC | PRN

## 2023-02-16 MED ORDER — MIDAZOLAM HCL 5 MG/5ML IJ SOLN
INTRAMUSCULAR | Status: DC | PRN
  Administered 2023-02-16: 2 mg via INTRAVENOUS

## 2023-02-16 MED ORDER — ENOXAPARIN SODIUM 40 MG/0.4ML IJ SOSY
40.0000 mg | PREFILLED_SYRINGE | INTRAMUSCULAR | Status: DC
  Administered 2023-02-17 – 2023-02-20 (×4): 40 mg via SUBCUTANEOUS
  Filled 2023-02-16 (×4): qty 0.4

## 2023-02-16 MED ORDER — BUPIVACAINE LIPOSOME 1.3 % IJ SUSP: 20.0000 mL | Freq: Once | INTRAMUSCULAR | Status: DC

## 2023-02-16 MED ORDER — DEXAMETHASONE SODIUM PHOSPHATE 10 MG/ML IJ SOLN
INTRAMUSCULAR | Status: DC | PRN
  Administered 2023-02-16: 10 mg via INTRAVENOUS

## 2023-02-16 MED ORDER — ENSURE PRE-SURGERY PO LIQD: 296.0000 mL | Freq: Once | ORAL | Status: DC

## 2023-02-16 MED ORDER — SIMETHICONE 80 MG PO CHEW: 40.0000 mg | CHEWABLE_TABLET | Freq: Four times a day (QID) | ORAL | Status: DC | PRN

## 2023-02-16 MED ORDER — LACTATED RINGERS IR SOLN
Status: DC | PRN
  Administered 2023-02-16: 1000 mL

## 2023-02-16 MED ORDER — BUPIVACAINE HCL 0.25 % IJ SOLN
INTRAMUSCULAR | Status: AC
  Filled 2023-02-16: qty 1

## 2023-02-16 MED ORDER — GABAPENTIN 300 MG PO CAPS
300.0000 mg | ORAL_CAPSULE | ORAL | Status: AC
  Administered 2023-02-16: 300 mg via ORAL
  Filled 2023-02-16: qty 1

## 2023-02-16 MED ORDER — MELATONIN 3 MG PO TABS: 3.0000 mg | ORAL_TABLET | Freq: Every evening | ORAL | Status: DC | PRN

## 2023-02-16 MED ORDER — PROPOFOL 10 MG/ML IV BOLUS
INTRAVENOUS | Status: DC | PRN
  Administered 2023-02-16: 200 mg via INTRAVENOUS

## 2023-02-16 MED ORDER — CHLORHEXIDINE GLUCONATE CLOTH 2 % EX PADS: 6.0000 | MEDICATED_PAD | Freq: Once | CUTANEOUS | Status: DC

## 2023-02-16 MED ORDER — PHENOL 1.4 % MT LIQD: 2.0000 | OROMUCOSAL | Status: DC | PRN

## 2023-02-16 MED ORDER — BUPIVACAINE LIPOSOME 1.3 % IJ SUSP
INTRAMUSCULAR | Status: DC | PRN
  Administered 2023-02-16: 20 mL

## 2023-02-16 MED ORDER — MAGIC MOUTHWASH: 15.0000 mL | Freq: Four times a day (QID) | ORAL | Status: DC | PRN

## 2023-02-16 MED ORDER — BUPIVACAINE-EPINEPHRINE (PF) 0.5% -1:200000 IJ SOLN
INTRAMUSCULAR | Status: AC
  Filled 2023-02-16: qty 30

## 2023-02-16 MED ORDER — FENTANYL CITRATE PF 50 MCG/ML IJ SOSY: 25.0000 ug | PREFILLED_SYRINGE | INTRAMUSCULAR | Status: DC | PRN

## 2023-02-16 MED ORDER — ONDANSETRON HCL 4 MG/2ML IJ SOLN
4.0000 mg | Freq: Four times a day (QID) | INTRAMUSCULAR | Status: DC | PRN
  Administered 2023-02-16 – 2023-02-17 (×4): 4 mg via INTRAVENOUS
  Filled 2023-02-16 (×3): qty 2

## 2023-02-16 MED ORDER — HYDROMORPHONE HCL 2 MG/ML IJ SOLN
INTRAMUSCULAR | Status: AC
  Filled 2023-02-16: qty 1

## 2023-02-16 MED ORDER — KETAMINE HCL 50 MG/5ML IJ SOSY
PREFILLED_SYRINGE | INTRAMUSCULAR | Status: AC
  Filled 2023-02-16: qty 5

## 2023-02-16 MED ORDER — DIPHENHYDRAMINE HCL 12.5 MG/5ML PO ELIX: 12.5000 mg | ORAL_SOLUTION | Freq: Four times a day (QID) | ORAL | Status: DC | PRN

## 2023-02-16 MED ORDER — KETAMINE HCL 10 MG/ML IJ SOLN
INTRAMUSCULAR | Status: DC | PRN
  Administered 2023-02-16: 10 mg via INTRAVENOUS
  Administered 2023-02-16: 40 mg via INTRAVENOUS

## 2023-02-16 MED ORDER — ALVIMOPAN 12 MG PO CAPS
12.0000 mg | ORAL_CAPSULE | ORAL | Status: AC
  Administered 2023-02-16: 12 mg via ORAL
  Filled 2023-02-16: qty 1

## 2023-02-16 MED ORDER — LACTATED RINGERS IV SOLN: INTRAVENOUS | Status: DC

## 2023-02-16 MED ORDER — TRAMADOL HCL 50 MG PO TABS
50.0000 mg | ORAL_TABLET | Freq: Four times a day (QID) | ORAL | Status: DC | PRN
  Administered 2023-02-17 – 2023-02-20 (×2): 50 mg via ORAL
  Filled 2023-02-16 (×2): qty 2

## 2023-02-16 MED ORDER — PHENYLEPHRINE HCL-NACL 20-0.9 MG/250ML-% IV SOLN
INTRAVENOUS | Status: DC | PRN
  Administered 2023-02-16: 50 ug/min via INTRAVENOUS

## 2023-02-16 MED ORDER — LIDOCAINE HCL (CARDIAC) PF 100 MG/5ML IV SOSY
PREFILLED_SYRINGE | INTRAVENOUS | Status: DC | PRN
  Administered 2023-02-16: 60 mg via INTRAVENOUS

## 2023-02-16 MED ORDER — BUPIVACAINE LIPOSOME 1.3 % IJ SUSP
INTRAMUSCULAR | Status: AC
  Filled 2023-02-16: qty 20

## 2023-02-16 MED ORDER — AMISULPRIDE (ANTIEMETIC) 5 MG/2ML IV SOLN: 10.0000 mg | Freq: Once | INTRAVENOUS | Status: DC | PRN

## 2023-02-16 MED ORDER — PHENYLEPHRINE HCL (PRESSORS) 10 MG/ML IV SOLN
INTRAVENOUS | Status: AC
  Filled 2023-02-16: qty 1

## 2023-02-16 MED ORDER — SODIUM CHLORIDE 0.9 % IV SOLN
1.0000 g | INTRAVENOUS | Status: AC
  Administered 2023-02-16: 1 g via INTRAVENOUS
  Filled 2023-02-16: qty 1

## 2023-02-16 MED ORDER — DIPHENHYDRAMINE HCL 50 MG/ML IJ SOLN
12.5000 mg | Freq: Four times a day (QID) | INTRAMUSCULAR | Status: DC | PRN
  Administered 2023-02-19: 12.5 mg via INTRAVENOUS
  Filled 2023-02-16: qty 1

## 2023-02-16 MED ORDER — ORAL CARE MOUTH RINSE: 15.0000 mL | Freq: Once | OROMUCOSAL | Status: AC

## 2023-02-16 MED ORDER — SODIUM CHLORIDE 0.9% FLUSH: 3.0000 mL | INTRAVENOUS | Status: DC | PRN

## 2023-02-16 MED ORDER — PHENYLEPHRINE HCL-NACL 20-0.9 MG/250ML-% IV SOLN: INTRAVENOUS | Status: DC | PRN

## 2023-02-16 MED ORDER — SODIUM CHLORIDE 0.9% FLUSH
3.0000 mL | Freq: Two times a day (BID) | INTRAVENOUS | Status: DC
  Administered 2023-02-16 – 2023-02-19 (×7): 3 mL via INTRAVENOUS

## 2023-02-16 MED ORDER — ROCURONIUM BROMIDE 100 MG/10ML IV SOLN
INTRAVENOUS | Status: DC | PRN
  Administered 2023-02-16: 20 mg via INTRAVENOUS
  Administered 2023-02-16: 50 mg via INTRAVENOUS
  Administered 2023-02-16: 20 mg via INTRAVENOUS
  Administered 2023-02-16: 30 mg via INTRAVENOUS

## 2023-02-16 MED ORDER — ALVIMOPAN 12 MG PO CAPS
12.0000 mg | ORAL_CAPSULE | Freq: Two times a day (BID) | ORAL | Status: DC
  Administered 2023-02-17 – 2023-02-19 (×4): 12 mg via ORAL
  Filled 2023-02-16 (×5): qty 1

## 2023-02-16 MED ORDER — CALCIUM POLYCARBOPHIL 625 MG PO TABS
625.0000 mg | ORAL_TABLET | Freq: Two times a day (BID) | ORAL | Status: DC
  Administered 2023-02-17 – 2023-02-20 (×6): 625 mg via ORAL
  Filled 2023-02-16 (×7): qty 1

## 2023-02-16 MED ORDER — ALUM & MAG HYDROXIDE-SIMETH 200-200-20 MG/5ML PO SUSP: 30.0000 mL | Freq: Four times a day (QID) | ORAL | Status: DC | PRN

## 2023-02-16 MED ORDER — CELECOXIB 200 MG PO CAPS
200.0000 mg | ORAL_CAPSULE | ORAL | Status: AC
  Administered 2023-02-16: 200 mg via ORAL
  Filled 2023-02-16: qty 1

## 2023-02-16 MED ORDER — 0.9 % SODIUM CHLORIDE (POUR BTL) OPTIME
TOPICAL | Status: DC | PRN
  Administered 2023-02-16: 1000 mL

## 2023-02-16 MED ORDER — MIDAZOLAM HCL 2 MG/2ML IJ SOLN
INTRAMUSCULAR | Status: AC
  Filled 2023-02-16: qty 2

## 2023-02-16 MED ORDER — ESCITALOPRAM OXALATE 20 MG PO TABS
20.0000 mg | ORAL_TABLET | Freq: Every day | ORAL | Status: DC
  Administered 2023-02-16 – 2023-02-20 (×4): 20 mg via ORAL
  Filled 2023-02-16 (×5): qty 1

## 2023-02-16 MED ORDER — HYDROMORPHONE HCL 1 MG/ML IJ SOLN
INTRAMUSCULAR | Status: DC | PRN
  Administered 2023-02-16 (×2): .5 mg via INTRAVENOUS

## 2023-02-16 MED ORDER — LACTATED RINGERS IV SOLN: INTRAVENOUS | Status: DC | PRN

## 2023-02-16 MED ORDER — PHENYLEPHRINE HCL (PRESSORS) 10 MG/ML IV SOLN
INTRAVENOUS | Status: DC | PRN
  Administered 2023-02-16 (×2): 80 ug via INTRAVENOUS

## 2023-02-16 MED ORDER — CHLORHEXIDINE GLUCONATE 0.12 % MT SOLN
15.0000 mL | Freq: Once | OROMUCOSAL | Status: AC
  Administered 2023-02-16: 15 mL via OROMUCOSAL

## 2023-02-16 MED ORDER — ACETAMINOPHEN 500 MG PO TABS
1000.0000 mg | ORAL_TABLET | ORAL | Status: AC
  Administered 2023-02-16: 1000 mg via ORAL
  Filled 2023-02-16: qty 2

## 2023-02-16 MED ORDER — METHOCARBAMOL 1000 MG/10ML IJ SOLN
1000.0000 mg | Freq: Four times a day (QID) | INTRAVENOUS | Status: DC | PRN
  Administered 2023-02-16: 1000 mg via INTRAVENOUS
  Filled 2023-02-16: qty 10
  Filled 2023-02-16: qty 1000

## 2023-02-16 MED ORDER — FENTANYL CITRATE (PF) 100 MCG/2ML IJ SOLN
INTRAMUSCULAR | Status: DC | PRN
  Administered 2023-02-16 (×2): 50 ug via INTRAVENOUS

## 2023-02-16 MED ORDER — SODIUM CHLORIDE 0.9 % IV SOLN: Freq: Three times a day (TID) | INTRAVENOUS | Status: DC | PRN

## 2023-02-16 MED ORDER — METOPROLOL TARTRATE 5 MG/5ML IV SOLN: 5.0000 mg | Freq: Four times a day (QID) | INTRAVENOUS | Status: DC | PRN

## 2023-02-16 MED ORDER — LACTATED RINGERS IV BOLUS: 1000.0000 mL | Freq: Three times a day (TID) | INTRAVENOUS | Status: AC | PRN

## 2023-02-16 MED ORDER — STERILE WATER FOR IRRIGATION IR SOLN
Status: DC | PRN
  Administered 2023-02-16: 2000 mL

## 2023-02-16 MED ORDER — BUPIVACAINE-EPINEPHRINE 0.25% -1:200000 IJ SOLN
INTRAMUSCULAR | Status: DC | PRN
  Administered 2023-02-16: 50 mL

## 2023-02-16 MED ORDER — ONDANSETRON HCL 4 MG PO TABS: 4.0000 mg | ORAL_TABLET | Freq: Four times a day (QID) | ORAL | Status: DC | PRN

## 2023-02-16 MED ORDER — EPHEDRINE SULFATE (PRESSORS) 50 MG/ML IJ SOLN
INTRAMUSCULAR | Status: DC | PRN
  Administered 2023-02-16: 5 mg via INTRAVENOUS
  Administered 2023-02-16: 10 mg via INTRAVENOUS

## 2023-02-16 MED ORDER — METHOCARBAMOL 500 MG PO TABS: 1000.0000 mg | ORAL_TABLET | Freq: Four times a day (QID) | ORAL | Status: DC | PRN

## 2023-02-16 MED ORDER — HYDROMORPHONE HCL 1 MG/ML IJ SOLN
0.5000 mg | INTRAMUSCULAR | Status: DC | PRN
  Administered 2023-02-16 – 2023-02-17 (×3): 1 mg via INTRAVENOUS
  Filled 2023-02-16 (×3): qty 1

## 2023-02-16 MED ORDER — FENTANYL CITRATE (PF) 100 MCG/2ML IJ SOLN
INTRAMUSCULAR | Status: AC
  Filled 2023-02-16: qty 2

## 2023-02-16 SURGICAL SUPPLY — 109 items
APL PRP STRL LF DISP 70% ISPRP (MISCELLANEOUS) ×1
APL SKNCLS STERI-STRIP NONHPOA (GAUZE/BANDAGES/DRESSINGS) ×1
APPLIER CLIP 5 13 M/L LIGAMAX5 (MISCELLANEOUS) ×1
APR CLP MED LRG 5 ANG JAW (MISCELLANEOUS) ×1
BAG COUNTER SPONGE SURGICOUNT (BAG) IMPLANT
BAG SPEC RTRVL 10 TROC 200 (ENDOMECHANICALS) ×1
BAG SPNG CNTER NS LX DISP (BAG)
BENZOIN TINCTURE PRP APPL 2/3 (GAUZE/BANDAGES/DRESSINGS) ×1 IMPLANT
BLADE SURG 15 STRL LF DISP TIS (BLADE) IMPLANT
BLADE SURG 15 STRL SS (BLADE)
BLADE SURG SZ10 CARB STEEL (BLADE) ×1 IMPLANT
BRIEF MESH DISP LRG (UNDERPADS AND DIAPERS) ×1 IMPLANT
CABLE HIGH FREQUENCY MONO STRZ (ELECTRODE) ×1 IMPLANT
CHLORAPREP W/TINT 26 (MISCELLANEOUS) ×1 IMPLANT
CLIP APPLIE 5 13 M/L LIGAMAX5 (MISCELLANEOUS) ×1 IMPLANT
CLSR STERI-STRIP ANTIMIC 1/2X4 (GAUZE/BANDAGES/DRESSINGS) IMPLANT
CNTNR URN SCR LID CUP LEK RST (MISCELLANEOUS) ×1 IMPLANT
CONT SPEC 4OZ STRL OR WHT (MISCELLANEOUS) ×1
COVER MAYO STAND STRL (DRAPES) ×1 IMPLANT
COVER SURGICAL LIGHT HANDLE (MISCELLANEOUS) ×1 IMPLANT
DRAIN CHANNEL 19F RND (DRAIN) IMPLANT
DRAPE C-ARM 42X120 X-RAY (DRAPES) ×1 IMPLANT
DRAPE LAPAROSCOPIC ABDOMINAL (DRAPES) ×1 IMPLANT
DRAPE LAPAROTOMY T 102X78X121 (DRAPES) ×1 IMPLANT
DRAPE SHEET LG 3/4 BI-LAMINATE (DRAPES) IMPLANT
DRAPE UTILITY XL STRL (DRAPES) ×1 IMPLANT
DRAPE WARM FLUID 44X44 (DRAPES) ×1 IMPLANT
DRSG IV TEGADERM 3.5X4.5 STRL (GAUZE/BANDAGES/DRESSINGS) IMPLANT
DRSG OPSITE POSTOP 3X4 (GAUZE/BANDAGES/DRESSINGS) IMPLANT
DRSG OPSITE POSTOP 4X10 (GAUZE/BANDAGES/DRESSINGS) IMPLANT
DRSG OPSITE POSTOP 4X6 (GAUZE/BANDAGES/DRESSINGS) IMPLANT
DRSG OPSITE POSTOP 4X8 (GAUZE/BANDAGES/DRESSINGS) IMPLANT
DRSG TEGADERM 2-3/8X2-3/4 SM (GAUZE/BANDAGES/DRESSINGS) ×2 IMPLANT
DRSG TEGADERM 4X4.75 (GAUZE/BANDAGES/DRESSINGS) ×1 IMPLANT
ELECT REM PT RETURN 15FT ADLT (MISCELLANEOUS) ×1 IMPLANT
ENDOLOOP SUT PDS II  0 18 (SUTURE) ×3
ENDOLOOP SUT PDS II 0 18 (SUTURE) IMPLANT
EVACUATOR SILICONE 100CC (DRAIN) IMPLANT
GAUZE 4X4 16PLY ~~LOC~~+RFID DBL (SPONGE) ×1 IMPLANT
GAUZE PAD ABD 8X10 STRL (GAUZE/BANDAGES/DRESSINGS) IMPLANT
GAUZE SPONGE 2X2 8PLY STRL LF (GAUZE/BANDAGES/DRESSINGS) ×1 IMPLANT
GAUZE SPONGE 4X4 12PLY STRL (GAUZE/BANDAGES/DRESSINGS) ×1 IMPLANT
GLOVE ECLIPSE 8.0 STRL XLNG CF (GLOVE) ×1 IMPLANT
GLOVE INDICATOR 8.0 STRL GRN (GLOVE) ×1 IMPLANT
GOWN STRL REUS W/ TWL XL LVL3 (GOWN DISPOSABLE) ×4 IMPLANT
GOWN STRL REUS W/TWL XL LVL3 (GOWN DISPOSABLE) ×4
HANDLE SUCTION POOLE (INSTRUMENTS) ×1 IMPLANT
IRRIG SUCT STRYKERFLOW 2 WTIP (MISCELLANEOUS) ×1
IRRIGATION SUCT STRKRFLW 2 WTP (MISCELLANEOUS) ×1 IMPLANT
KIT BASIN OR (CUSTOM PROCEDURE TRAY) ×1 IMPLANT
KIT TURNOVER KIT A (KITS) IMPLANT
LEGGING LITHOTOMY PAIR STRL (DRAPES) ×1 IMPLANT
LOOP VESSEL MAXI BLUE (MISCELLANEOUS) IMPLANT
NDL HYPO 22X1.5 SAFETY MO (MISCELLANEOUS) ×1 IMPLANT
NEEDLE HYPO 22X1.5 SAFETY MO (MISCELLANEOUS) ×1 IMPLANT
PACK BASIC VI WITH GOWN DISP (CUSTOM PROCEDURE TRAY) ×1 IMPLANT
PACK GENERAL/GYN (CUSTOM PROCEDURE TRAY) ×1 IMPLANT
PAD POSITIONING PINK XL (MISCELLANEOUS) ×1 IMPLANT
PENCIL SMOKE EVACUATOR (MISCELLANEOUS) IMPLANT
POUCH RETRIEVAL ECOSAC 10 (ENDOMECHANICALS) IMPLANT
POUCH RETRIEVAL ECOSAC 10MM (ENDOMECHANICALS) ×1
PROTECTOR NERVE ULNAR (MISCELLANEOUS) IMPLANT
RELOAD PROXIMATE 75MM BLUE (ENDOMECHANICALS) ×2 IMPLANT
RELOAD STAPLE 60 3.6 BLU REG (STAPLE) IMPLANT
RELOAD STAPLE 75 3.8 BLU REG (ENDOMECHANICALS) IMPLANT
RELOAD STAPLER BLUE 60MM (STAPLE) ×1 IMPLANT
SCISSORS LAP 5X35 DISP (ENDOMECHANICALS) ×1 IMPLANT
SCRUB CHG 4% DYNA-HEX 4OZ (MISCELLANEOUS) ×1 IMPLANT
SET CHOLANGIOGRAPH MIX (MISCELLANEOUS) ×1 IMPLANT
SET TUBE SMOKE EVAC HIGH FLOW (TUBING) ×1 IMPLANT
SHEARS HARMONIC 9CM CVD (BLADE) IMPLANT
SHEARS HARMONIC ACE PLUS 36CM (ENDOMECHANICALS) ×1 IMPLANT
SLEEVE Z-THREAD 5X100MM (TROCAR) ×1 IMPLANT
SPIKE FLUID TRANSFER (MISCELLANEOUS) ×1 IMPLANT
STAPLER 90 3.5 STAND SLIM (STAPLE) ×1
STAPLER 90 3.5 STD SLIM (STAPLE) IMPLANT
STAPLER ECHELON LONG 60 440 (INSTRUMENTS) IMPLANT
STAPLER PROXIMATE 75MM BLUE (STAPLE) IMPLANT
STAPLER RELOAD BLUE 60MM (STAPLE) ×1
STAPLER VISISTAT 35W (STAPLE) ×1 IMPLANT
SUCTION POOLE HANDLE (INSTRUMENTS) ×1
SURGILUBE 2OZ TUBE FLIPTOP (MISCELLANEOUS) ×1 IMPLANT
SUT CHROMIC 2 0 SH (SUTURE) ×1 IMPLANT
SUT CHROMIC 3 0 SH 27 (SUTURE) IMPLANT
SUT MNCRL AB 4-0 PS2 18 (SUTURE) ×1 IMPLANT
SUT PDS AB 1 CT1 27 (SUTURE) ×2 IMPLANT
SUT PDS AB 1 TP1 96 (SUTURE) IMPLANT
SUT SILK 2 0 (SUTURE) ×1
SUT SILK 2 0 SH CR/8 (SUTURE) ×1 IMPLANT
SUT SILK 2-0 18XBRD TIE 12 (SUTURE) ×1 IMPLANT
SUT SILK 3 0 (SUTURE) ×1
SUT SILK 3 0 SH CR/8 (SUTURE) ×1 IMPLANT
SUT SILK 3-0 18XBRD TIE 12 (SUTURE) ×1 IMPLANT
SUT VIC AB 2-0 SH 27 (SUTURE)
SUT VIC AB 2-0 SH 27X BRD (SUTURE) IMPLANT
SUT VIC AB 2-0 UR6 27 (SUTURE) ×6 IMPLANT
SYR 20ML LL LF (SYRINGE) ×1 IMPLANT
SYR 3ML LL SCALE MARK (SYRINGE) IMPLANT
SYR BULB IRRIG 60ML STRL (SYRINGE) ×1 IMPLANT
SYS WOUND ALEXIS 18CM MED (MISCELLANEOUS) ×1
SYSTEM WOUND ALEXIS 18CM MED (MISCELLANEOUS) IMPLANT
TAPE UMBILICAL 1/8 X36 TWILL (MISCELLANEOUS) ×1 IMPLANT
TOWEL OR 17X26 10 PK STRL BLUE (TOWEL DISPOSABLE) ×2 IMPLANT
TOWEL OR NON WOVEN STRL DISP B (DISPOSABLE) ×2 IMPLANT
TRAY FOLEY MTR SLVR 16FR STAT (SET/KITS/TRAYS/PACK) IMPLANT
TRAY LAPAROSCOPIC (CUSTOM PROCEDURE TRAY) ×1 IMPLANT
TROCAR ADV FIXATION 12X100MM (TROCAR) IMPLANT
TROCAR Z-THREAD OPTICAL 5X100M (TROCAR) ×1 IMPLANT
YANKAUER SUCT BULB TIP 10FT TU (MISCELLANEOUS) ×1 IMPLANT

## 2023-02-16 NOTE — Interval H&P Note (Signed)
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  The various methods of treatment have been discussed with the patient and family. After consideration of risks, benefits and other options for treatment, the patient has consented to  Procedure(s): OPEN TAKEDOWN OF LOOP ILEOSTOMY (N/A) FLEXIBLE SIGMOIDOSCOPY (N/A) ANORECTAL EXAM UNDER ANESTHESIA (N/A) LAPAROSCOPIC CHOLECYSTECTOMY SINGLE SITE WITH INTRAOPERATIVE CHOLANGIOGRAM (N/A) as a surgical intervention.  The patient's history has been reviewed, patient examined, no change in status, stable for surgery.  I have reviewed the patient's chart and labs.  Questions were answered to the patient's satisfaction.     Ardeth Sportsman

## 2023-02-16 NOTE — Discharge Instructions (Signed)
SURGERY: POST OP INSTRUCTIONS (Surgery for small bowel obstruction, colon resection, etc)   ######################################################################  EAT Gradually transition to a high fiber diet with a fiber supplement over the next few days after discharge  WALK Walk an hour a day.  Control your pain to do that.    CONTROL PAIN Control pain so that you can walk, sleep, tolerate sneezing/coughing, go up/down stairs.  HAVE A BOWEL MOVEMENT DAILY Keep your bowels regular to avoid problems.  OK to try a laxative to override constipation.  OK to use an antidairrheal to slow down diarrhea.  Call if not better after 2 tries  CALL IF YOU HAVE PROBLEMS/CONCERNS Call if you are still struggling despite following these instructions. Call if you have concerns not answered by these instructions  ######################################################################   DIET Follow a light diet the first few days at home.  Start with a bland diet such as soups, liquids, starchy foods, low fat foods, etc.  If you feel full, bloated, or constipated, stay on a ful liquid or pureed/blenderized diet for a few days until you feel better and no longer constipated. Be sure to drink plenty of fluids every day to avoid getting dehydrated (feeling dizzy, not urinating, etc.). Gradually add a fiber supplement to your diet over the next week.  Gradually get back to a regular solid diet.  Avoid fast food or heavy meals the first week as you are more likely to get nauseated. It is expected for your digestive tract to need a few months to get back to normal.  It is common for your bowel movements and stools to be irregular.  You will have occasional bloating and cramping that should eventually fade away.  Until you are eating solid food normally, off all pain medications, and back to regular activities; your bowels will not be normal. Focus on eating a low-fat, high fiber diet the rest of your life  (See Getting to Good Bowel Health, below).  CARE of your INCISION or WOUND  It is good for closed incisions and even open wounds to be washed every day.  Shower every day.  Short baths are fine.  Wash the incisions and wounds clean with soap & water.    You may leave closed incisions open to air if it is dry.   You may cover the incision with clean gauze & replace it after your daily shower for comfort.  TEGADERM:  You have clear gauze band-aid dressings over your closed incision(s).  Remove the dressings 3 days after surgery.    If you have an open wound with a wound vac, see wound vac care instructions.    ACTIVITIES as tolerated Start light daily activities --- self-care, walking, climbing stairs-- beginning the day after surgery.  Gradually increase activities as tolerated.  Control your pain to be active.  Stop when you are tired.  Ideally, walk several times a day, eventually an hour a day.   Most people are back to most day-to-day activities in a few weeks.  It takes 4-8 weeks to get back to unrestricted, intense activity. If you can walk 30 minutes without difficulty, it is safe to try more intense activity such as jogging, treadmill, bicycling, low-impact aerobics, swimming, etc. Save the most intensive and strenuous activity for last (Usually 4-8 weeks after surgery) such as sit-ups, heavy lifting, contact sports, etc.  Refrain from any intense heavy lifting or straining until you are off narcotics for pain control.  You will have off days, but   things should improve week-by-week. DO NOT PUSH THROUGH PAIN.  Let pain be your guide: If it hurts to do something, don't do it.  Pain is your body warning you to avoid that activity for another week until the pain goes down. You may drive when you are no longer taking narcotic prescription pain medication, you can comfortably wear a seatbelt, and you can safely make sudden turns/stops to protect yourself without hesitating due to pain. You may  have sexual intercourse when it is comfortable. If it hurts to do something, stop.  MEDICATIONS Take your usually prescribed home medications unless otherwise directed.   Blood thinners:  Usually you can restart any strong blood thinners after the second postoperative day.  It is OK to take aspirin right away.     If you are on strong blood thinners (warfarin/Coumadin, Plavix, Xerelto, Eliquis, Pradaxa, etc), discuss with your surgeon, medicine PCP, and/or cardiologist for instructions on when to restart the blood thinner & if blood monitoring is needed (PT/INR blood check, etc).     PAIN CONTROL Pain after surgery or related to activity is often due to strain/injury to muscle, tendon, nerves and/or incisions.  This pain is usually short-term and will improve in a few months.  To help speed the process of healing and to get back to regular activity more quickly, DO THE FOLLOWING THINGS TOGETHER: Increase activity gradually.  DO NOT PUSH THROUGH PAIN Use Ice and/or Heat Try Gentle Massage and/or Stretching Take over the counter pain medication Take Narcotic prescription pain medication for more severe pain  Good pain control = faster recovery.  It is better to take more medicine to be more active than to stay in bed all day to avoid medications.  Increase activity gradually Avoid heavy lifting at first, then increase to lifting as tolerated over the next 6 weeks. Do not "push through" the pain.  Listen to your body and avoid positions and maneuvers than reproduce the pain.  Wait a few days before trying something more intense Walking an hour a day is encouraged to help your body recover faster and more safely.  Start slowly and stop when getting sore.  If you can walk 30 minutes without stopping or pain, you can try more intense activity (running, jogging, aerobics, cycling, swimming, treadmill, sex, sports, weightlifting, etc.) Remember: If it hurts to do it, then don't do it! Use Ice and/or  Heat You will have swelling and bruising around the incisions.  This will take several weeks to resolve. Ice packs or heating pads (6-8 times a day, 30-60 minutes at a time) will help sooth soreness & bruising. Some people prefer to use ice alone, heat alone, or alternate between ice & heat.  Experiment and see what works best for you.  Consider trying ice for the first few days to help decrease swelling and bruising; then, switch to heat to help relax sore spots and speed recovery. Shower every day.  Short baths are fine.  It feels good!  Keep the incisions and wounds clean with soap & water.   Try Gentle Massage and/or Stretching Massage at the area of pain many times a day Stop if you feel pain - do not overdo it Take over the counter pain medication This helps the muscle and nerve tissues become less irritable and calm down faster Choose ONE of the following over-the-counter anti-inflammatory medications: Acetaminophen 500mg tabs (Tylenol) 1-2 pills with every meal and just before bedtime (avoid if you have liver problems or   if you have acetaminophen in you narcotic prescription) Naproxen 220mg tabs (ex. Aleve, Naprosyn) 1-2 pills twice a day (avoid if you have kidney, stomach, IBD, or bleeding problems) Ibuprofen 200mg tabs (ex. Advil, Motrin) 3-4 pills with every meal and just before bedtime (avoid if you have kidney, stomach, IBD, or bleeding problems) Take with food/snack several times a day as directed for at least 2 weeks to help keep pain / soreness down & more manageable. Take Narcotic prescription pain medication for more severe pain A prescription for strong pain control is often given to you upon discharge (for example: oxycodone/Percocet, hydrocodone/Norco/Vicodin, or tramadol/Ultram) Take your pain medication as prescribed. Be mindful that most narcotic prescriptions contain Tylenol (acetaminophen) as well - avoid taking too much Tylenol. If you are having problems/concerns with  the prescription medicine (does not control pain, nausea, vomiting, rash, itching, etc.), please call us [phone] to see if we need to switch you to a different pain medicine that will work better for you and/or control your side effects better. If you need a refill on your pain medication, you must call the office before 4 pm and on weekdays only.  By federal law, prescriptions for narcotics cannot be called into a pharmacy.  They must be filled out on paper & picked up from our office by the patient or authorized caretaker.  Prescriptions cannot be filled after 4 pm nor on weekends.    WHEN TO CALL US [phone] Severe uncontrolled or worsening pain  Fever over 101 F (38.5 C) Concerns with the incision: Worsening pain, redness, rash/hives, swelling, bleeding, or drainage Reactions / problems with new medications (itching, rash, hives, nausea, etc.) Nausea and/or vomiting Difficulty urinating Difficulty breathing Worsening fatigue, dizziness, lightheadedness, blurred vision Other concerns If you are not getting better after two weeks or are noticing you are getting worse, contact our office [phone] for further advice.  We may need to adjust your medications, re-evaluate you in the office, send you to the emergency room, or see what other things we can do to help. The clinic staff is available to answer your questions during regular business hours (8:30am-5pm).  Please don't hesitate to call and ask to speak to one of our nurses for clinical concerns.    A surgeon from [facility] is always on call at the hospitals 24 hours/day If you have a medical emergency, go to the nearest emergency room or call 911.  FOLLOW UP in our office One the day of your discharge from the hospital (or the next business weekday), please call [facility] to set up or confirm an appointment to see your surgeon in the office for a follow-up appointment.  Usually it is 2-3 weeks  after your surgery.   If you have skin staples at your incision(s), let the office know so we can set up a time in the office for the nurse to remove them (usually around 10 days after surgery). Make sure that you call for appointments the day of discharge (or the next business weekday) from the hospital to ensure a convenient appointment time. IF YOU HAVE DISABILITY OR FAMILY LEAVE FORMS, BRING THEM TO THE OFFICE FOR PROCESSING.  DO NOT GIVE THEM TO YOUR DOCTOR.  [facility] [address], [address], [place], [place]  [address] ? [phone] - Main [phone] - Toll Free,  [phone] - Fax [email or web address]    GETTING TO GOOD BOWEL HEALTH. It is expected for your   digestive tract to need a few months to get back to normal.  It is common for your bowel movements and stools to be irregular.  You will have occasional bloating and cramping that should eventually fade away.  Until you are eating solid food normally, off all pain medications, and back to regular activities; your bowels will not be normal.   Avoiding constipation The goal: ONE SOFT BOWEL MOVEMENT A DAY!    Drink plenty of fluids.  Choose water first. TAKE A FIBER SUPPLEMENT EVERY DAY THE REST OF YOUR LIFE During your first week back home, gradually add back a fiber supplement every day Experiment which form you can tolerate.   There are many forms such as powders, tablets, wafers, gummies, etc Psyllium bran (Metamucil), methylcellulose (Citrucel), Miralax or Glycolax, Benefiber, Flax Seed.  Adjust the dose week-by-week (1/2 dose/day to 6 doses a day) until you are moving your bowels 1-2 times a day.  Cut back the dose or try a different fiber product if it is giving you problems such as diarrhea or bloating. Sometimes a laxative is needed to help jump-start bowels if constipated until the fiber supplement can help regulate your bowels.  If you are tolerating eating & you are farting, it  is okay to try a gentle laxative such as double dose MiraLax, prune juice, or Milk of Magnesia.  Avoid using laxatives too often. Stool softeners can sometimes help counteract the constipating effects of narcotic pain medicines.  It can also cause diarrhea, so avoid using for too long. If you are still constipated despite taking fiber daily, eating solids, and a few doses of laxatives, call our office. Controlling diarrhea Try drinking liquids and eating bland foods for a few days to avoid stressing your intestines further. Avoid dairy products (especially milk & ice cream) for a short time.  The intestines often can lose the ability to digest lactose when stressed. Avoid foods that cause gassiness or bloating.  Typical foods include beans and other legumes, cabbage, broccoli, and dairy foods.  Avoid greasy, spicy, fast foods.  Every person has some sensitivity to other foods, so listen to your body and avoid those foods that trigger problems for you. Probiotics (such as active yogurt, Align, etc) may help repopulate the intestines and colon with normal bacteria and calm down a sensitive digestive tract Adding a fiber supplement gradually can help thicken stools by absorbing excess fluid and retrain the intestines to act more normally.  Slowly increase the dose over a few weeks.  Too much fiber too soon can backfire and cause cramping & bloating. It is okay to try and slow down diarrhea with a few doses of antidiarrheal medicines.   Bismuth subsalicylate (ex. Kayopectate, Pepto Bismol) for a few doses can help control diarrhea.  Avoid if pregnant.   Loperamide (Imodium) can slow down diarrhea.  Start with one tablet (2mg) first.  Avoid if you are having fevers or severe pain.  ILEOSTOMY PATIENTS WILL HAVE CHRONIC DIARRHEA since their colon is not in use.    Drink plenty of liquids.  You will need to drink even more glasses of water/liquid a day to avoid getting dehydrated. Record output from your  ileostomy.  Expect to empty the bag every 3-4 hours at first.  Most people with a permanent ileostomy empty their bag 4-6 times at the least.   Use antidiarrheal medicine (especially Imodium) several times a day to avoid getting dehydrated.  Start with a dose at bedtime & breakfast.    Adjust up or down as needed.  Increase antidiarrheal medications as directed to avoid emptying the bag more than 8 times a day (every 3 hours). Work with your wound ostomy nurse to learn care for your ostomy.  See ostomy care instructions. TROUBLESHOOTING IRREGULAR BOWELS 1) Start with a soft & bland diet. No spicy, greasy, or fried foods.  2) Avoid gluten/wheat or dairy products from diet to see if symptoms improve. 3) Miralax 17gm or flax seed mixed in 8oz. water or juice-daily. May use 2-4 times a day as needed. 4) Gas-X, Phazyme, etc. as needed for gas & bloating.  5) Prilosec (omeprazole) over-the-counter as needed 6)  Consider probiotics (Align, Activa, etc) to help calm the bowels down  Call your doctor if you are getting worse or not getting better.  Sometimes further testing (cultures, endoscopy, X-ray studies, CT scans, bloodwork, etc.) may be needed to help diagnose and treat the cause of the diarrhea. [facility] [address], [address], [place], [place]  [address] [phone] - Main.    [phone]  - Toll Free.   [phone] - Fax [email or web address]  

## 2023-02-16 NOTE — Op Note (Signed)
02/16/2023  2:35 PM  PATIENT:  Carlos Stone  47 y.o. male  Patient Care Team: Assunta Found, MD as PCP - General (Family Medicine) Karie Soda, MD as Consulting Physician (General Surgery) Kerin Salen, MD as Consulting Physician (Gastroenterology)  PRE-OPERATIVE DIAGNOSIS:   LOOP ILEOSTOMY IN PLACE FOR FECAL DIVERSION HISTORY OF SIGMOID DIVERTICULITIS CHOLECYSTITIS S/P PERCUTANEOUS CHOLECYSTOTOMY TUBE DRAINAGE  POST-OPERATIVE DIAGNOSIS:  LOOP ILEOSTOMY IN PLACE FOR FECAL DIVERSION HISTORY OF SIGMOID DIVERTICULITIS DIVERSION COLITIS (MILD) CHOLECYSTITIS S/P PERCUTANEOUS CHOLECYSTOTOMY TUBE DRAINAGE  PROCEDURE:   OPEN TAKEDOWN OF LOOP ILEOSTOMY FLEXIBLE SIGMOIDOSCOPY LAPAROSCOPIC CHOLECYSTECTOMY WITH INTRAOPERATIVE CHOLANGIOGRAM LYSIS OF ADHESIONS X 45 MIN (40% CASE) TRANSVERSUS ABDOMINIS PLANE (TAP) BLOCK - BILATERAL  SURGEON:  Ardeth Sportsman, MD  ASSISTANT:  Angelena Form, MD  An experienced assistant was required given the standard of surgical care given the complexity of the case.  This assistant was needed for exposure, dissection, suction, tissue approximation, retraction, perception, etc  ANESTHESIA:  General endotracheal intubation anesthesia (GETA) and Regional TRANSVERSUS ABDOMINIS PLANE (TAP) nerve block -BILATERAL for perioperative & postoperative pain control at the level of the transverse abdominis & preperitoneal spaces along the flank at the anterior axillary line, from subcostal ridge to iliac crest under laparoscopic guidance provided with liposomal bupivacaine (Experel) 20mL mixed with 50 mL of bupivicaine 0.25%  Estimated Blood Loss (EBL):   Total I/O In: 1300 [I.V.:1300] Out: 200 [Urine:100; Blood:100].   (See anesthesia record)  Delay start of Pharmacological VTE agent (>24hrs) due to concerns of significant anemia, surgical blood loss, or risk of bleeding?:  no  DRAINS: (None)  SPECIMEN:  Gallbladder and Ileostomy  DISPOSITION OF SPECIMEN:   Pathology  COUNTS:  Sponge, needle, & instrument counts CORRECT  PLAN OF CARE: Admit to inpatient   PATIENT DISPOSITION:  PACU - hemodynamically stable.  INDICATION:  Patient requiring diverting loop ileostomy.  The patient has recovered to the point not requiring fecal diversion.  Requested ostomy takedown.  Postoperative CT scan with rectal current tenderness to confirm no active leak.  I felt was appropriate this time.  I recommended flexible sigmoidoscopy to evaluate anastomosis and probable takedown of ileostomy and examination under anesthesia:   The anatomy & physiology of the digestive tract was discussed. The pathophysiology was discussed. Possibility of remaining with an ostomy permanently was discussed. I offered ostomy takedown. Laparoscopic & open techniques were discussed.   Risks such as bleeding, infection, abscess, leak, reoperation, possible re-ostomy, injury to other organs, hernia, heart attack, death, and other risks were discussed. I noted a good likelihood this will help address the problem. Goals of post-operative recovery were discussed as well. We will work to minimize complications. Questions were answered. The patient expresses understanding & wishes to proceed with surgery.   Patient developed cholecystitis within a month of having his diverting loop ileostomy.  Partner palliated with percutaneous cholecystostomy tube drainage.  He has stabilized.  Recommended interval cholecystectomy as well.  The anatomy & physiology of hepatobiliary & pancreatic function was discussed.  The pathophysiology of gallbladder dysfunction was discussed.  Natural history risks without surgery was discussed.   I feel the risks of no intervention will lead to serious problems that outweigh the operative risks; therefore, I recommended cholecystectomy to remove the pathology.  I explained laparoscopic techniques with possible need for an open approach.  Probable cholangiogram to evaluate the  bilary tract was explained as well.    Risks such as bleeding, infection, diarrhea and other bowel changes, abscess, leak, injury to  other organs, need for repair of tissues / organs, need for further treatment, stroke, heart attack, death, and other risks were discussed.  I noted a good likelihood this will help address the problem, but there is a chance it may not help.  Possibility that this will not correct all abdominal symptoms was explained.  Goals of post-operative recovery were discussed as well.  We will work to minimize complications.  An educational handout further explaining the pathology and treatment options was given as well.  Questions were answered.  The patient expresses understanding & wishes to proceed with surgery.   OR FINDINGS:   No stricture at rectal anastomosis.  Some exposed staples posterior midline but no evidence of active opening or fistula.  Minimal narrowing.  Anastomosis at 14 cm in anal verge by flexible sigmoidoscopy.  Mild diversion colitis and proctitis.    Anastomosis well healed.   Mild adhesions of loop ileostomy to abdominal wall.  No parastomal hernia. It is a side-to-side stapled ileoileal anastomosis close to the terminal ileum.   Percutaneous cholecystostomy tube between ribs going into a very strong and thickened gallbladder.  Planes for gallbladder and liver obliterated by very thick adhesions.  Ventral dome down done.  Cholangiography confirmed no retained stones or leak.  Gallbladder stapled off at the mid cystic duct using a blue stapler given massive thickening and inability to place clips or have Endoloop provide adequate closure.  CASE DATA:  Type of patient?: Elective [facility] Private Case  Status of Case? Elective Scheduled  Infection Present At Time Of Surgery (PATOS)?  PHLEGMON  DESCRIPTION:   Informed consent was confirmed. The patient received IV antibiotics and underwent general anesthesia without any difficulty. The patient was  positioned in low litholtomy. Foley catheter was sterilely placed. SCDs were active during the entire case.  A surgical timeout confirmed our plan.   I performed examination under anesthesia of the perineum.  Findings as above.  Normal sphincter tone.  Mild dilation done with a finger.  Has little to pass a flexible sigmoidoscope easily up to the anastomosis.  95% smooth and well-healed.  Posterior midline a few exposed staples on the mucosa.  Mild divot in there however no opening or fistula or concern no abnormality.  No leak.  Dr. Cliffton Asters agreed.  Anastomosis at 14 cm from anal verge.  Felt it was safe to proceed with diverting loop ileoostomy takedown.  .   The abdomen was prepped and draped in a sterile fashion.  I changed gown/gloves. Surgical timeout confirmed for ileostomy takedown. I proceeded to make a circular incision around the loop ileostomy. The loop ileostomy was freed from adhesions to the subcutaneous and fascial layers of the abdominal wall using focused cautery and sharp dissection.  I was able to breech safely into the peritoneum. No adhesions felt. We could eviscerate the small bowel just proximal/distal to the loop ileostomy.   Wound protector placed.  We did a side-to-side stapled anastomosis using a GIA-75 stapler.  Transected through the ileal mesentery of the loop ileostomy staying close to the bowel.  Used harmonic scalpel as well as some suture silk 2-0.  We then used a TLX 90 to staple off the common defect.   I used interrupted 2-0 silk stitches to close the mesenteric defect transversely and do mesenteo-pexy over the TX staple line.  Another silk suture placed at the crotch of proximal end of the anastomosis for antitension.  Hemostasis was excellent. Ileostomy returned into the peritoneal cavity.  We  changed gloves.  Please take The Wound Protector and a 12 Mm Port.  Proceeded with laparoscopic Cholecystectomy.  Placed a 5mm port supraumbilically.  Patient had moderate omental  adhesions in the right upper quadrant and periumbilical region that were carefully freed off such that we could place another 5 mm port in the epigastric region and another 5 mm port in the right flank.   Patient had some thick omental and mesocolon adhesions to the gallbladder where the cholecystostomy tube was going.  Carefully freed those off.  Freed adhesions between the liver and the diaphragm and stomach off the left upper quadrant as well.  Could see a phlegmonous chronically thickened gallbladder.  When the transected the drain and removed it.    The gallbladder fundus was elevated cephalad.  I ultrasonic harmonic dissection between the gallbladder and the liver on the posteriolateral and anteriomedial walls.   I used careful blunt and hydrodissection to gradually thin and peel off layers.  This Raponer covering for extremely thickened several millimeters.  Eventually had to transition to a dome down approach to eventually free allof the gallbladder off the liver bed to get a good critical view of the infundibulum.  Eventually skeletonized and identified anterior and posterior branches of the cystic artery.  These were controlled with clips on the retroperitoneal side and harmonic scalpel transection on the gallbladder side.    I skeletonized the cystic duct.  It was extremely thickened.  Eventually skeletonized and felt well we had a good critical view of the infundibular/cystic duct junction.  Tissues are too thick to clip.  With clamped infundibulum and I sharply came through that junction to find a narrowed lumen that had thin bile with it.  We decided to do a cholangiogram in the hopes of confirming decent anatomy.  I placed a 5 [place] cholangiocatheter through a puncture site at the right subcostal ridge of the abdominal wall and directed it into the cystic duct.  Clip would not work so ended up taking that doing Endoloop around the gallbladder cystic duct and reapplying the cholangiocatheter in  tightening a 0 PDS Endoloop around the cystic ductotomy to help Coley catheter in place.  Did flush well.  When the intra cholangiogram became apparent the catheter had come out.  Went back in and placed again and used a laparoscopic clamp to better hold it.  With that we could run a cholangiogram with dilute radio-opaque contrast and continuous fluoroscopy. Contrast flowed from a side branch consistent with cystic duct cannulization.  Still had several centimeters of narrow helical cystic duct.  Contrast flowed up the common hepatic duct into the right and left intrahepatic chains out to secondary radicals. Contrast flowed down the common bile duct easily across the normal ampulla into the duodenum.  This was consistent with a normal cholangiogram.  I removed the cholangiocatheter.  Because we cannot get good ligation with clips nor Endoloop decided to use a blue load laparoscopic stapler to transect at the mid and cystic duct with having a good critical view just proximal to the ductotomy.  1 good firing to good result.  Gallbladder was removed.  We ensured hemostasis on the gallbladder fossa of the liver and elsewhere. I inspected the rest of the abdomen & detected no injury nor bleeding elsewhere.  Ileal-ileal anastomosis rested well without any leak nor ischemia.  No evidence of any leak or abnormality in the pelvis.  We evacuated, dioxide and removed ports and wound protector and changed gloves.  With  that I could we assured hemostasis and the former ostomy wound.  Wound irrigated.  I closed the posterior rectus fascia with 0 Vicryl suture.  Anterior rectus fascia was closed using #1 PDS in a running fashion transversely. Skin edges trimmed to remove excess scarring and have healthy edges.   The circular wound was closed using pursestring sutures at Scarpa's fascia of Vicryl and Monocryl in the subcuticular region.  Umbilical tape was used as a wick into the open part of the ostomy wound..  Sterile  dressing placed.   Patient extubated and in recovery room in stable condition.  I discussed postoperative plans for recovery with the patient in the holding area.  Instructions are written.  I discussed operative findings, updated the patient's status, discussed probable steps to recovery, and gave postoperative recommendations to the patient's spouse, Samantha Crimes .  Recommendations were made.  Questions were answered.  She expressed understanding & appreciation.  Ardeth Sportsman, MD, FACS, MASCRS Esophageal, Gastrointestinal & Colorectal Surgery Robotic and Minimally Invasive Surgery  [facility], [facility]  [facility]  [address], [address] [place], [place] [address] [phone] Fax [phone] Main

## 2023-02-16 NOTE — Interval H&P Note (Signed)
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  The various methods of treatment have been discussed with the patient and family. After consideration of risks, benefits and other options for treatment, the patient has consented to  Procedure(s): OPEN TAKEDOWN OF LOOP ILEOSTOMY (N/A) FLEXIBLE SIGMOIDOSCOPY (N/A) ANORECTAL EXAM UNDER ANESTHESIA (N/A) LAPAROSCOPIC CHOLECYSTECTOMY SINGLE SITE WITH INTRAOPERATIVE CHOLANGIOGRAM (N/A) as a surgical intervention.  The patient's history has been reviewed, patient examined, no change in status, stable for surgery.  I have reviewed the patient's chart and labs.  Questions were answered to the patient's satisfaction.    I have re-reviewed the the patient's records, history, medications, and allergies.  I have re-examined the patient.  I again discussed intraoperative plans and goals of post-operative recovery.  The patient agrees to proceed.  Carlos Stone  October 31, 1976 [id number]  Patient Care Team: Assunta Found, MD as PCP - General (Family Medicine) Karie Soda, MD as Consulting Physician (General Surgery) Kerin Salen, MD as Consulting Physician (Gastroenterology)  Patient Active Problem List   Diagnosis Date Noted   Acute cholecystitis 12/15/2022   Ileostomy care 12/14/2022   Ileostomy in place 12/11/2022   Anxiety disorder 11/22/2022   Depressed state 11/22/2022   Diverticulitis 11/10/2022   Colonic diverticular abscess 07/07/2022   Diverticulitis with abscess s/p sigmoid colectomy 11/10/2022 07/07/2022   Elevated transaminase level 09/24/2013   Hypokalemia 09/22/2013   Pleuritic chest pain 09/22/2013    Past Medical History:  Diagnosis Date   Anxiety disorder    Community acquired pneumonia 09/21/2013   Diverticulosis    with abcess 09/06/22   Meningitis    age 29   PONV (postoperative nausea and  vomiting)     Past Surgical History:  Procedure Laterality Date   APPENDECTOMY  2005   COLOSTOMY N/A 11/13/2022   Procedure: COLOSTOMY;  Surgeon: Karie Soda, MD;  Location: [facility];  Service: General;  Laterality: N/A;   IR PERC CHOLECYSTOSTOMY  12/16/2022   IR RADIOLOGIST EVAL & MGMT  07/23/2022   IR RADIOLOGIST EVAL & MGMT  01/03/2023   KNEE ARTHROSCOPY Right 2005   LAPAROSCOPY N/A 11/13/2022   Procedure: LAPAROSCOPY DIAGNOSTIC WASHOURT AND DRAINAGE OF INTRAABDOMINAL ABSCESSES X 2, DIVERTING LOOP ILEOSTOMY;  Surgeon: Karie Soda, MD;  Location: [facility];  Service: General;  Laterality: N/A;   PROCTOSCOPY N/A 11/10/2022   Procedure: RIGID PROCTOSCOPY;  Surgeon: Karie Soda, MD;  Location: [facility];  Service: General;  Laterality: N/A;    Social History   Socioeconomic History   Marital status: Married    Spouse name: Not on file   Number of children: Not on file   Years of education: Not on file   Highest education level: Not on file  Occupational History   Not on file  Tobacco Use   Smoking status: Never   Smokeless tobacco: Never  Vaping Use   Vaping Use: Never used  Substance and Sexual Activity   Alcohol use: Not Currently    Alcohol/week: 1.0 standard drink of alcohol    Types: 1 Glasses of wine per week   Drug use: No   Sexual activity: Yes  Other Topics Concern   Not on file  Social History Narrative   Not on file   Social Determinants of Health   Financial Resource Strain: Not on file  Food Insecurity: No Food Insecurity (12/16/2022)   Hunger Vital  Sign    Worried About Programme researcher, broadcasting/film/video in the Last Year: Never true    Ran Out of Food in the Last Year: Never true  Transportation Needs: No Transportation Needs (12/16/2022)   PRAPARE - Administrator, Civil Service (Medical): No    Lack of Transportation (Non-Medical): No  Physical Activity: Not on file  Stress: Not on file  Social Connections: Not on file  Intimate Partner Violence: Not At Risk  (12/16/2022)   Humiliation, Afraid, Rape, and Kick questionnaire    Fear of Current or Ex-Partner: No    Emotionally Abused: No    Physically Abused: No    Sexually Abused: No    History reviewed. No pertinent family history.  Medications Prior to Admission  Medication Sig Dispense Refill Last Dose   escitalopram (LEXAPRO) 20 MG tablet Take 20 mg by mouth daily.   02/15/2023   Ferrous Sulfate (IRON PO) Take 1 tablet by mouth daily.   02/14/2023   loperamide (IMODIUM A-D) 2 MG tablet Take 2 mg by mouth at bedtime as needed for diarrhea or loose stools.   02/11/2023   Wheat Dextrin (BENEFIBER PO) Take 2 Scoops by mouth daily.   02/14/2023   cefdinir (OMNICEF) 300 MG capsule Take 1 capsule (300 mg total) by mouth 2 (two) times daily. (Patient not taking: Reported on 02/08/2023) 10 capsule 0 Completed Course   ferrous sulfate 325 (65 FE) MG tablet Take 1 tablet (325 mg total) by mouth 2 (two) times daily with a meal. (Patient not taking: Reported on 02/08/2023) 30 tablet 3 Not Taking   ondansetron (ZOFRAN) 4 MG tablet Take 1 tablet (4 mg total) by mouth every 6 (six) hours as needed for nausea or vomiting (Use Zofran (ondansetron) 1st). (Patient not taking: Reported on 02/08/2023) 6 tablet 2 Not Taking   ondansetron (ZOFRAN-ODT) 4 MG disintegrating tablet Take 1 tablet (4 mg total) by mouth every 6 (six) hours as needed for nausea. (Patient not taking: Reported on 02/08/2023) 20 tablet 2 Not Taking    Current Facility-Administered Medications  Medication Dose Route Frequency Provider Last Rate Last Admin   bupivacaine liposome (EXPAREL) 1.3 % injection 266 mg  20 mL Infiltration Once Karie Soda, MD       Chlorhexidine Gluconate Cloth 2 % PADS 6 each  6 each Topical Once Karie Soda, MD       ertapenem [facility]) 1,000 mg in sodium chloride 0.9 % 100 mL IVPB  1 g Intravenous On Call to OR Karie Soda, MD       lactated ringers infusion   Intravenous Continuous Kaylyn Layer, MD 10 mL/hr at 02/16/23  1100 New Bag at 02/16/23 1100     Allergies  Allergen Reactions   Compazine [Prochlorperazine Edisylate] Other (See Comments)    Hallucinations    Amoxil [Amoxicillin] Rash    Tolerated cefepime fine   Cipro [Ciprofloxacin Hcl] Rash   Penicillins Rash    BP (!) 122/91   Pulse 95   Temp 98.1 F (36.7 C) (Oral)   Resp 16   Ht 5\' 9"  (1.753 m)   Wt 85.3 kg   SpO2 98%   BMI 27.77 kg/m   Labs: No results found for this or any previous visit (from the past 48 hour(s)).  Imaging / Studies: No results found.   Carlos Stone, M.D., F.A.C.S. Gastrointestinal and Minimally Invasive Surgery [facility] [address], [address] [place], [place] [address] [phone] Main /  Paging  02/16/2023 11:09 AM    Carlos Stone

## 2023-02-16 NOTE — Anesthesia Procedure Notes (Signed)
Procedure Name: Intubation Date/Time: 02/16/2023 12:36 PM  Performed by: Theodosia Quay, CRNAPre-anesthesia Checklist: Patient identified, Emergency Drugs available, Suction available, Patient being monitored and Timeout performed Patient Re-evaluated:Patient Re-evaluated prior to induction Oxygen Delivery Method: Circle system utilized Preoxygenation: Pre-oxygenation with 100% oxygen Induction Type: IV induction Ventilation: Mask ventilation without difficulty Laryngoscope Size: Mac and 4 Grade View: Grade I Tube type: Oral Tube size: 7.5 mm Number of attempts: 1 Airway Equipment and Method: Stylet Placement Confirmation: ETT inserted through vocal cords under direct vision, positive ETCO2, CO2 detector and breath sounds checked- equal and bilateral Secured at: 22 cm Tube secured with: Tape Dental Injury: Teeth and Oropharynx as per pre-operative assessment

## 2023-02-16 NOTE — H&P (Signed)
02/16/2023   PROVIDER: Jarrett Soho, MD  Patient Care Team: Corrie Mckusick, MD as PCP - General (Family Medicine) Michaell Cowing, Shawn Route, MD as Consulting Provider (Colon and Rectal Surgery) Kerin Salen, MD (Gastroenterology)  [facility] MRN: [id number] DOB: 10-03-1976 DATE OF ENCOUNTER: 01/17/2023  Interval History:  The patient returns to the office after undergoing Firefly and robotic low anterior rectosigmoid resection for recurrent sigmoid diverticulitis 11/10/2022.  Diagnostic laparoscopy with washout and diverting loop ileostomy on 11/13/2022  Pathology: Diverticulosis with acute diverticulitis and pericolonic abscess.  Patient returns. Since I saw him, he had worsening abdominal pain. Was admitted for cholecystitis. Partner placed a PERC drain in his gallbladder and sent cholecystectomy. Stabilized recovered and was discharged a couple days later. He followed up with interventional radiology and cholangiogram 3/4 noted cystic duct obstruction persistent. He had been asymptomatic with diverting loop ileostomy. He is emptying the ileostomy 6-8 times a day. Not lightheaded or dizzy. Not using any Imodium. Appetite good. The gallbladder drain is putting very little out at this point. He is flushing it every other day.  PRIOR NOTE 12/14/2022 Patient returns. Recurrent diverticulitis with prior abscess well-contained. Underwent resection with small abscess 1 cm washed out. Developed pain and discomfort and was found to have delayed leak requiring ileostomy diversion washout. Stabilized and discharged 1/22. Followed up with ostomy clinic since discharge. Last seen last week.  He comes in today feeling better. Appetite is markedly improved. He still having soreness on his flanks. Definitely getting better week by week. Try and use Tylenol and heat. His back pain is gone away. He is taking Benefiber and iron once a day. Emptying his diverting loop ileostomy bag 4-5 times a day. Walking well.  Making good urine. Had a lot of issues with narcotics & avoided that    Labs, Imaging and Diagnostic Testing:  Located in 'Care Everywhere' section of Epic EMR chart  PRIOR [facility] CLINIC NOTES:  Located in 'Care Everywhere' section of Epic EMR chart  SURGERY NOTES:  Located in 'Care Everywhere' section of Epic EMR chart  PATHOLOGY:  Located in 'Care Everywhere' section of Epic EMR chart  Physical Examination:  There is no height or weight on file to calculate BMI.  Constitutional: Not cachectic. Hygeine adequate. Eyes: Normal extraocular movements. Sclera nonicteric Neuro: No major focal sensory defects. No major motor deficits. Psych: No severe agitation. No severe anxiety. Judgment & insight Adequate, Oriented x4, HENT: Normocephalic, Mucus membranes moist. No thrush. Neck: Supple, No tracheal deviation. Chest: Good respiratory excursion. No audible wheezing CV: No major extremity edema Ext: No obvious deformity or contracture. Edema: not present. No cyanosis Skin: Warm and dry Musculoskeletal: Mobility: no assist device moving easily without restrictions  Abdomen: Incisions Clean & dry with normal healing ridge Tenderness at bilateral flanks very mild without guarding. Ileostomy right side pink with oatmeal consistency effluent in bag. . Soft. Nondistended.  Percutaneous drain with scant bile tinged thin fluid in bag.  Gen: Inguinal hernia: Not present. Inguinal lymph nodes: without lymphadenopathy.  Rectal: (Deferred)    Assessment and Plan:  Carlos Stone is a 47 y.o. male recovering s/p robotic rectosigmoid resection for diverticulitis complicated with delayed leak requiring diverting loop ileostomy.  Recent cholecystitis admission s/p perc drainage     Recovering status post resection for chronic sigmoid diverticulitis complicated with delayed anastomotic leak requiring diverting loop ileostomy.  Repeat CT scan with contrast shows no stricture leak or other  concerns at the colorectal anastomosis.  Reassuring.  Okay to  proceed with loop ileostomy takedown.  Most likely will start with flexible sigmoidoscopy to assess the anastomosis and make sure there are no issues.  If there is no leak obstruction or chronic sinus cavity and he is 3 months out from surgery, can plan open loop ileostomy takedown.  The anatomy & physiology of the digestive tract was discussed. The pathophysiology was discussed. Possibility of remaining with an ostomy permanently was discussed. I offered ostomy takedown. Laparoscopic & open techniques were discussed.  Risks such as bleeding, infection, abscess, leak, reoperation, possible re-ostomy, injury to other organs, need for repair of tissues / organs, need for further treatment, hernia, heart attack, death, and other risks were discussed. I noted a good likelihood this will help address the problem. Goals of post-operative recovery were discussed as well. We will work to minimize complications. Questions were answered. The patient expresses understanding & wishes to proceed with surgery.  Patient needs cholecystectomy.  Will see if we can do through the GelPort of the loop ileostomy site versus extra ports. The anatomy & physiology of hepatobiliary & pancreatic function was discussed. The pathophysiology of gallbladder dysfunction was discussed. Natural history risks without surgery was discussed. I feel the risks of no intervention will lead to serious problems that outweigh the operative risks; therefore, I recommended cholecystectomy to remove the pathology. I explained laparoscopic techniques with possible need for an open approach. Probable cholangiogram to evaluate the bilary tract was explained as well. Possible need for a needle core biopsy if liver changes seen or history of abnormal liver labs  Risks such as bleeding, infection, diarrhea and other bowel changes, abscess, leak, injury to other organs, need for repair of tissues  / organs, need for further treatment, stroke, heart attack, death, and other risks were discussed. I noted a good likelihood this will help address the problem, but there is a chance it may not help. Possibility that this will not correct all abdominal symptoms was explained. Goals of post-operative recovery were discussed as well. We will work to minimize complications. An educational handout further explaining the pathology and treatment options was given as well. Questions were answered. The patient expresses understanding & wishes to proceed with surgery.

## 2023-02-16 NOTE — Transfer of Care (Signed)
Immediate Anesthesia Transfer of Care Note  Patient: Carlos Stone  Procedure(s) Performed: OPEN TAKEDOWN OF LOOP ILEOSTOMY, BILATERAL TAP BLOCK FLEXIBLE SIGMOIDOSCOPY ANORECTAL EXAM UNDER ANESTHESIA LAPAROSCOPIC CHOLECYSTECTOMY SINGLE SITE WITH INTRAOPERATIVE CHOLANGIOGRAM, LYSIS OF ADHESIONS  Patient Location: PACU  Anesthesia Type:General  Level of Consciousness: drowsy and patient cooperative  Airway & Oxygen Therapy: Patient Spontanous Breathing and Patient connected to face mask  Post-op Assessment: Report given to RN and Post -op Vital signs reviewed and stable  Post vital signs: Reviewed and stable  Last Vitals:  Vitals Value Taken Time  BP 134/80 02/16/23 1445  Temp    Pulse 74 02/16/23 1448  Resp 16 02/16/23 1448  SpO2 100 % 02/16/23 1448  Vitals shown include unvalidated device data.  Last Pain:  Vitals:   02/16/23 1052  TempSrc:   PainSc: 0-No pain         Complications: No notable events documented.

## 2023-02-16 NOTE — Progress Notes (Signed)
Patient called to see if he can come in for surgery earlier, patient said that he was leaving now.  Evern Bio BSN, Radio producer - Perioperative Services [place] [phone]

## 2023-02-16 NOTE — Anesthesia Preprocedure Evaluation (Signed)
Anesthesia Evaluation  Patient identified by MRN, date of birth, ID band Patient awake    Reviewed: Allergy & Precautions, NPO status , Patient's Chart, lab work & pertinent test results  History of Anesthesia Complications (+) PONV and history of anesthetic complications  Airway Mallampati: II  TM Distance: >3 FB Neck ROM: Full    Dental  (+) Dental Advisory Given   Pulmonary neg pulmonary ROS   breath sounds clear to auscultation       Cardiovascular negative cardio ROS  Rhythm:Regular Rate:Normal     Neuro/Psych negative neurological ROS     GI/Hepatic Neg liver ROS,,,Diverticulitis s/p loop ileostomy   Endo/Other  negative endocrine ROS    Renal/GU negative Renal ROS     Musculoskeletal   Abdominal   Peds  Hematology negative hematology ROS (+)   Anesthesia Other Findings   Reproductive/Obstetrics                             Anesthesia Physical Anesthesia Plan  ASA: 2  Anesthesia Plan: General   Post-op Pain Management: Tylenol PO (pre-op)*, Celebrex PO (pre-op)*, Gabapentin PO (pre-op)* and Ketamine IV*   Induction: Intravenous  PONV Risk Score and Plan: 4 or greater and Dexamethasone, Ondansetron, Midazolam, Treatment may vary due to age or medical condition and Scopolamine patch - Pre-op  Airway Management Planned: Oral ETT  Additional Equipment: None  Intra-op Plan:   Post-operative Plan: Extubation in OR  Informed Consent: I have reviewed the patients History and Physical, chart, labs and discussed the procedure including the risks, benefits and alternatives for the proposed anesthesia with the patient or authorized representative who has indicated his/her understanding and acceptance.     Dental advisory given  Plan Discussed with: CRNA  Anesthesia Plan Comments:        Anesthesia Quick Evaluation

## 2023-02-17 ENCOUNTER — Encounter (HOSPITAL_COMMUNITY): Payer: Self-pay | Admitting: Surgery

## 2023-02-17 ENCOUNTER — Other Ambulatory Visit: Payer: Self-pay

## 2023-02-17 LAB — CBC
HCT: 34.6 % — ABNORMAL LOW (ref 39.0–52.0)
Hemoglobin: 10.7 g/dL — ABNORMAL LOW (ref 13.0–17.0)
MCH: 27 pg (ref 26.0–34.0)
MCHC: 30.9 g/dL (ref 30.0–36.0)
MCV: 87.2 fL (ref 80.0–100.0)
Platelets: 292 10*3/uL (ref 150–400)
RBC: 3.97 MIL/uL — ABNORMAL LOW (ref 4.22–5.81)
RDW: 15 % (ref 11.5–15.5)
WBC: 15.9 10*3/uL — ABNORMAL HIGH (ref 4.0–10.5)
nRBC: 0 % (ref 0.0–0.2)

## 2023-02-17 LAB — HEPATIC FUNCTION PANEL
ALT: 308 U/L — ABNORMAL HIGH (ref 0–44)
AST: 151 U/L — ABNORMAL HIGH (ref 15–41)
Albumin: 3.2 g/dL — ABNORMAL LOW (ref 3.5–5.0)
Alkaline Phosphatase: 117 U/L (ref 38–126)
Bilirubin, Direct: 0.5 mg/dL — ABNORMAL HIGH (ref 0.0–0.2)
Indirect Bilirubin: 0.8 mg/dL (ref 0.3–0.9)
Total Bilirubin: 1.3 mg/dL — ABNORMAL HIGH (ref 0.3–1.2)
Total Protein: 5.4 g/dL — ABNORMAL LOW (ref 6.5–8.1)

## 2023-02-17 LAB — TYPE AND SCREEN
ABO/RH(D): O POS
Antibody Screen: NEGATIVE

## 2023-02-17 LAB — BASIC METABOLIC PANEL
Anion gap: 9 (ref 5–15)
BUN: 16 mg/dL (ref 6–20)
CO2: 24 mmol/L (ref 22–32)
Calcium: 8.4 mg/dL — ABNORMAL LOW (ref 8.9–10.3)
Chloride: 102 mmol/L (ref 98–111)
Creatinine, Ser: 0.93 mg/dL (ref 0.61–1.24)
GFR, Estimated: >60 mL/min (ref >60)
Glucose, Bld: 198 mg/dL — ABNORMAL HIGH (ref 70–99)
Potassium: 4.8 mmol/L (ref 3.5–5.1)
Sodium: 135 mmol/L (ref 135–145)

## 2023-02-17 LAB — LIPASE, BLOOD: Lipase: 31 U/L (ref 11–51)

## 2023-02-17 LAB — MAGNESIUM: Magnesium: 1.8 mg/dL (ref 1.7–2.4)

## 2023-02-17 MED ORDER — METOCLOPRAMIDE HCL 5 MG/ML IJ SOLN: 5.0000 mg | Freq: Three times a day (TID) | INTRAMUSCULAR | Status: DC | PRN

## 2023-02-17 MED ORDER — SODIUM CHLORIDE 0.9 % IV SOLN: 8.0000 mg | Freq: Four times a day (QID) | INTRAVENOUS | Status: DC | PRN

## 2023-02-17 MED ORDER — LACTATED RINGERS IV BOLUS
1000.0000 mL | Freq: Once | INTRAVENOUS | Status: AC
  Administered 2023-02-17: 1000 mL via INTRAVENOUS

## 2023-02-17 MED ORDER — ONDANSETRON HCL 4 MG PO TABS
4.0000 mg | ORAL_TABLET | Freq: Four times a day (QID) | ORAL | Status: DC
  Administered 2023-02-17: 4 mg via ORAL
  Filled 2023-02-17: qty 1

## 2023-02-17 MED ORDER — LORAZEPAM 2 MG/ML IJ SOLN
0.5000 mg | Freq: Three times a day (TID) | INTRAMUSCULAR | Status: AC | PRN
  Administered 2023-02-18: 1 mg via INTRAVENOUS
  Filled 2023-02-17: qty 1

## 2023-02-17 MED ORDER — METHOCARBAMOL 500 MG PO TABS: 1000.0000 mg | ORAL_TABLET | Freq: Four times a day (QID) | ORAL | Status: DC | PRN

## 2023-02-17 MED ORDER — METOCLOPRAMIDE HCL 5 MG/ML IJ SOLN: 10.0000 mg | Freq: Four times a day (QID) | INTRAMUSCULAR | Status: DC

## 2023-02-17 MED ORDER — METHOCARBAMOL 1000 MG/10ML IJ SOLN
1000.0000 mg | Freq: Three times a day (TID) | INTRAVENOUS | Status: AC
  Administered 2023-02-17 – 2023-02-20 (×9): 1000 mg via INTRAVENOUS
  Filled 2023-02-17 (×4): qty 1000
  Filled 2023-02-17: qty 10
  Filled 2023-02-17 (×2): qty 1000
  Filled 2023-02-17: qty 10

## 2023-02-17 MED ORDER — SCOPOLAMINE 1 MG/3DAYS TD PT72
1.0000 | MEDICATED_PATCH | TRANSDERMAL | Status: DC
  Administered 2023-02-17 – 2023-02-20 (×2): 1.5 mg via TRANSDERMAL
  Filled 2023-02-17 (×3): qty 1

## 2023-02-17 MED ORDER — HYDROMORPHONE HCL 1 MG/ML IJ SOLN
1.0000 mg | INTRAMUSCULAR | Status: DC | PRN
  Administered 2023-02-17 (×2): 2 mg via INTRAVENOUS
  Administered 2023-02-18 – 2023-02-20 (×5): 1 mg via INTRAVENOUS
  Filled 2023-02-17 (×2): qty 1
  Filled 2023-02-17: qty 2
  Filled 2023-02-17 (×2): qty 1
  Filled 2023-02-17: qty 2
  Filled 2023-02-17: qty 1

## 2023-02-17 MED ORDER — LORAZEPAM 2 MG/ML IJ SOLN: 0.5000 mg | Freq: Three times a day (TID) | INTRAMUSCULAR | Status: DC | PRN

## 2023-02-17 MED ORDER — ONDANSETRON HCL 4 MG/2ML IJ SOLN
4.0000 mg | Freq: Four times a day (QID) | INTRAMUSCULAR | Status: DC
  Administered 2023-02-17 – 2023-02-20 (×11): 4 mg via INTRAVENOUS
  Filled 2023-02-17 (×12): qty 2

## 2023-02-17 MED ORDER — HYDROMORPHONE HCL 1 MG/ML IJ SOLN
1.0000 mg | Freq: Once | INTRAMUSCULAR | Status: AC
  Administered 2023-02-17: 1 mg via INTRAVENOUS
  Filled 2023-02-17: qty 1

## 2023-02-17 NOTE — Progress Notes (Signed)
Pt walked using a walker with 1 person assist to the bathroom and pt finally voided.

## 2023-02-17 NOTE — Progress Notes (Signed)
Encouraged pt to get up and move and void. Pt complained of nausea and dizziness.  Pt refuses reglan for past negative reaction.  No Urine output still. Bladder scan shows 154 ml.   Bolus Fluids per order given to pt. Pt's BP went up from 105/79 to 124/79. But still no urine output.   Notified MD on call. New orders received.  Another fluid bolus and Stat blood draw ordered.

## 2023-02-17 NOTE — Progress Notes (Signed)
Patient given scheduled Zofran for nausea, later attempted to take scheduled PO meds and had episode of emesis. Contacted MD to get an order for antiemetic that patient could take due to allergy list. Patient has allergy documented to Compazine. Patient also has stated that he has had a past reaction to Reglan as well, mentioned that to the nurse that I spoke with over the phone when contacting MD. Patient has declined order for Reglan.

## 2023-02-17 NOTE — Progress Notes (Signed)
Carlos Stone [id number] 18-Nov-1975  CARE TEAM:  PCP: Assunta Found, MD  Outpatient Care Team: Patient Care Team: Assunta Found, MD as PCP - General (Family Medicine) Karie Soda, MD as Consulting Physician (General Surgery) Kerin Salen, MD as Consulting Physician (Gastroenterology)  Inpatient Treatment Team: Treatment Team: Attending Provider: Karie Soda, MD; Registered Nurse: Josph Macho, RN; Technician: Fuller Canada, NT; Technician: Vella Raring, NT; Utilization Review: Hanley Hays, RN; Registered Nurse: Sabino Snipes, RN   Problem List:   Principal Problem:   Diverticulitis of colon   1 Day Post-Op  02/16/2023  POST-OPERATIVE DIAGNOSIS:  LOOP ILEOSTOMY IN PLACE FOR FECAL DIVERSION HISTORY OF SIGMOID DIVERTICULITIS DIVERSION COLITIS (MILD) CHOLECYSTITIS S/P PERCUTANEOUS CHOLECYSTOTOMY TUBE DRAINAGE   PROCEDURE:   OPEN TAKEDOWN OF LOOP ILEOSTOMY FLEXIBLE SIGMOIDOSCOPY LAPAROSCOPIC CHOLECYSTECTOMY WITH INTRAOPERATIVE CHOLANGIOGRAM LYSIS OF ADHESIONS X 45 MIN (40% CASE) TRANSVERSUS ABDOMINIS PLANE (TAP) BLOCK - BILATERAL   SURGEON:  Ardeth Sportsman, MD  OR FINDINGS:    No stricture at rectal anastomosis.  Some exposed staples posterior midline but no evidence of active opening or fistula.  Minimal narrowing.  Anastomosis at 14 cm in anal verge by flexible sigmoidoscopy.  Mild diversion colitis and proctitis.    Anastomosis well healed.    Mild adhesions of loop ileostomy to abdominal wall.  No parastomal hernia. It is a side-to-side stapled ileoileal anastomosis close to the terminal ileum.    Percutaneous cholecystostomy tube between ribs going into a very strong and thickened gallbladder.  Planes for gallbladder and liver obliterated by very thick adhesions.  Ventral dome down done.  Cholangiography confirmed no retained stones or leak.  Gallbladder stapled off at the mid cystic duct using a blue stapler given massive thickening  and inability to place clips or have Endoloop provide adequate closure.  Assessment  Struggling with postoperative nausea and pain.  [facility] Stay = 1 days)  Plan:  -ERAS protocol -Hold off on advancing diet today -Scheduled Zofran for 24 hours.  Hopefully his postop nausea will abate -Pain control.  Ice/heat.  Scheduled Tylenol.  Make Robaxin scheduled.  Give extra dose of narcotics and increase intensity and frequency for now. -Follow labs. -Continue Lexapro for anxiety/depression. -VTE prophylaxis- SCDs, etc -mobilize as tolerated to help recovery  Disposition:  Disposition:  The patient is from: Home  Anticipate discharge to:  Home  Anticipated Date of Discharge is:  April 21,2024    Barriers to discharge:  Pending Clinical improvement (more likely than not)  Patient currently is NOT MEDICALLY STABLE for discharge from the hospital from a surgery standpoint.      I reviewed nursing notes, last 24 h vitals and pain scores, last 48 h intake and output, last 24 h labs and trends, and last 24 h imaging results. I have reviewed this patient's available data, including medical history, events of note, test results, etc as part of my evaluation.  A significant portion of that time was spent in counseling.  Care during the described time interval was provided by me.  This care required moderate level of medical decision making.  02/17/2023    Subjective: (Chief complaint)  Struggling with nausea.  No emesis.  Neck and shoulder soreness.  Heating pad somewhat helping.  Sore in abdomen near the ostomy site.  Has not gotten out.  Wife at bedside.  Objective:  Vital signs:  Vitals:   02/17/23 0019 02/17/23 0253 02/17/23 0455 02/17/23 0525  BP: 105/79 124/79  113/77  Pulse:  91 86  81  Resp: Temp: 98.1 F (36.7 C) 98.1 F (36.7 C)  98.2 F (36.8 C)  TempSrc:      SpO2: 97% 100%  97%  Weight:   87.1 kg   Height:           Intake/Output    Yesterday:  04/17 0701 - 04/18 0700 In: 3244.3 [P.O.:55; I.V.:2398.3; IV Piggyback:790.9] Out: 200 [Urine:100; Blood:100] This shift:  No intake/output data recorded.  Bowel function:  Flatus: No  BM:  No  Drain: (No drain)   Physical Exam:  General: Pt awake/alert in no active distress but sore and uncomfortable and tired.   Eyes: PERRL, normal EOM.  Sclera clear.  No icterus Neuro: CN II-XII intact w/o focal sensory/motor deficits. Lymph: No head/neck/groin lymphadenopathy Psych:  No delerium/psychosis/paranoia.  Oriented x 4 HENT: Normocephalic, Mucus membranes moist.  No thrush Neck: Supple, No tracheal deviation.  No obvious thyromegaly Chest: No pain to chest wall compression.  Good respiratory excursion.  No audible wheezing CV:  Pulses intact.  Regular rhythm.  No major extremity edema MS: Normal AROM mjr joints.  No obvious deformity  Abdomen: Soft.  Mildy distended.  Mildly tender at incisions only.  No evidence of peritonitis.  No incarcerated hernias.  Ext:  No deformity.  No mjr edema.  No cyanosis Skin: No petechiae / purpurea.  No major sores.  Warm and dry    Results:   Cultures: No results found for this or any previous visit (from the past 720 hour(s)).  Labs: Results for orders placed or performed during the hospital encounter of 02/16/23 (from the past 48 hour(s))  Type and screen [facility]     Status: None   Collection Time: 02/17/23  3:55 AM  Result Value Ref Range   ABO/RH(D) O POS    Antibody Screen NEG    Sample Expiration      02/20/2023,2359 Performed at [facility], [address] [place], [place] [address]   Basic metabolic panel     Status: Abnormal   Collection Time: 02/17/23  3:55 AM  Result Value Ref Range   Sodium 135 135 - 145 mmol/L   Potassium 4.8 3.5 - 5.1 mmol/L   Chloride 102 98 - 111 mmol/L   CO2 24 22 - 32 mmol/L   Glucose, Bld 198 (H) 70 - 99 mg/dL    Comment: Glucose  reference range applies only to samples taken after fasting for at least 8 hours.   BUN 16 6 - 20 mg/dL   Creatinine, Ser 6.04 0.61 - 1.24 mg/dL   Calcium 8.4 (L) 8.9 - 10.3 mg/dL   GFR, Estimated >54 >09 mL/min    Comment: (NOTE) Calculated using the CKD-EPI Creatinine Equation (2021)    Anion gap 9 5 - 15    Comment: Performed at [facility], [address] [place], [place] [address]  CBC     Status: Abnormal   Collection Time: 02/17/23  3:55 AM  Result Value Ref Range   WBC 15.9 (H) 4.0 - 10.5 K/uL   RBC 3.97 (L) 4.22 - 5.81 MIL/uL   Hemoglobin 10.7 (L) 13.0 - 17.0 g/dL   HCT 47.8 (L) 29.5 - 62.1 %   MCV 87.2 80.0 - 100.0 fL   MCH 27.0 26.0 - 34.0 pg   MCHC 30.9 30.0 - 36.0 g/dL   RDW 30.8 65.7 - 84.6 %   Platelets 292 150 -  400 K/uL   nRBC 0.0 0.0 - 0.2 %    Comment: Performed at [facility], [address], [place], [place] [address]  Magnesium     Status: None   Collection Time: 02/17/23  3:55 AM  Result Value Ref Range   Magnesium 1.8 1.7 - 2.4 mg/dL    Comment: Performed at [facility], [address] [place], [place] [address]    Imaging / Studies: DG Cholangiogram Operative  Result Date: 02/16/2023 CLINICAL DATA:  Cholecystectomy EXAM: INTRAOPERATIVE CHOLANGIOGRAM TECHNIQUE: Cholangiographic images from the C-arm fluoroscopic device were submitted for interpretation post-operatively. Please see the procedural report for the amount of contrast and the fluoroscopy time utilized. FLUOROSCOPY: Radiation Exposure Index (as provided by the fluoroscopic device): 9.42 mGy Kerma COMPARISON:  12/15/2022 FINDINGS: Intraoperative cholangiogram performed during laparoscopic procedure. The residual cystic duct, biliary confluence, common hepatic duct, and common bile duct are all patent. Contrast easily drains into the duodenum. No dilatation, obstruction, stricture, or large filling defect. IMPRESSION: Patent biliary system.  Electronically Signed   By: Judie Petit.  Shick M.D.   On: 02/16/2023 15:25    Medications / Allergies: per chart  Antibiotics: Anti-infectives (From admission, onward)    Start     Dose/Rate Route Frequency Ordered Stop   02/16/23 1030  ertapenem (INVANZ) 1,000 mg in sodium chloride 0.9 % 100 mL IVPB        1 g 200 mL/hr over 30 Minutes Intravenous On call to O.R. 02/16/23 1022 02/16/23 1202         Note: Portions of this report may have been transcribed using voice recognition software. Every effort was made to ensure accuracy; however, inadvertent computerized transcription errors may be present.   Any transcriptional errors that result from this process are unintentional.    Ardeth Sportsman, MD, FACS, MASCRS Esophageal, Gastrointestinal & Colorectal Surgery Robotic and Minimally Invasive Surgery  [facility] [address] [place], [place] [address] [phone] Fax [phone] Main  CONTACT INFORMATION:  Weekday (9AM-5PM): Call [facility] main office at [phone]  Weeknight (5PM-9AM) or Weekend/Holiday: Check [email or web address] (password " [email or web address]") for General Surgery [facility] coverage  (Please, do not use SecureChat as it is not reliable communication to reach operating surgeons for immediate patient care given surgeries/outpatient duties/clinic/cross-coverage/off post-call which would lead to a delay in care.  Epic staff messaging available for outptient concerns, but may not be answered for 48 hours or more).     02/17/2023  7:12 AM

## 2023-02-17 NOTE — Progress Notes (Signed)
Bladder scan shows 357 ml. Encouraged pt to get up and void.  Pt refused at this time. Will try again later when pain med and nausea med kicks in per patient.

## 2023-02-17 NOTE — Anesthesia Postprocedure Evaluation (Signed)
Anesthesia Post Note  Patient: Carlos Stone  Procedure(s) Performed: OPEN TAKEDOWN OF LOOP ILEOSTOMY, BILATERAL TAP BLOCK FLEXIBLE SIGMOIDOSCOPY ANORECTAL EXAM UNDER ANESTHESIA LAPAROSCOPIC CHOLECYSTECTOMY SINGLE SITE WITH INTRAOPERATIVE CHOLANGIOGRAM, LYSIS OF ADHESIONS     Patient location during evaluation: PACU Anesthesia Type: General Level of consciousness: awake and alert Pain management: pain level controlled Vital Signs Assessment: post-procedure vital signs reviewed and stable Respiratory status: spontaneous breathing, nonlabored ventilation, respiratory function stable and patient connected to nasal cannula oxygen Cardiovascular status: blood pressure returned to baseline and stable Postop Assessment: no apparent nausea or vomiting Anesthetic complications: no   No notable events documented.  Last Vitals:  Vitals:   02/17/23 0525 02/17/23 1404  BP: 113/77 115/82  Pulse: 81 93  Resp: 20 18  Temp: 36.8 C 36.9 C  SpO2: 97% 93%    Last Pain:  Vitals:   02/17/23 1404  TempSrc: Oral  PainSc:                  Kennieth Rad

## 2023-02-17 NOTE — Progress Notes (Signed)
  Transition of Care [facility]) Screening Note   Patient Details  Name: Carlos Stone Date of Birth: 1976-05-30   Transition of Care [facility]) CM/SW Contact:    Armanda Heritage, RN Phone Number: 02/17/2023, 9:06 AM    Transition of Care Department [facility]) has reviewed patient and no TOC needs have been identified at this time. We will continue to monitor patient advancement through interdisciplinary progression rounds. If new patient transition needs arise, please place a TOC consult.

## 2023-02-18 LAB — CBC
HCT: 26.8 % — ABNORMAL LOW (ref 39.0–52.0)
Hemoglobin: 8.3 g/dL — ABNORMAL LOW (ref 13.0–17.0)
MCH: 27.1 pg (ref 26.0–34.0)
MCHC: 31 g/dL (ref 30.0–36.0)
MCV: 87.6 fL (ref 80.0–100.0)
Platelets: 210 10*3/uL (ref 150–400)
RBC: 3.06 MIL/uL — ABNORMAL LOW (ref 4.22–5.81)
RDW: 15.2 % (ref 11.5–15.5)
WBC: 11.3 10*3/uL — ABNORMAL HIGH (ref 4.0–10.5)
nRBC: 0 % (ref 0.0–0.2)

## 2023-02-18 LAB — COMPREHENSIVE METABOLIC PANEL
ALT: 240 U/L — ABNORMAL HIGH (ref 0–44)
AST: 80 U/L — ABNORMAL HIGH (ref 15–41)
Albumin: 3.1 g/dL — ABNORMAL LOW (ref 3.5–5.0)
Alkaline Phosphatase: 96 U/L (ref 38–126)
Anion gap: 6 (ref 5–15)
BUN: 17 mg/dL (ref 6–20)
CO2: 29 mmol/L (ref 22–32)
Calcium: 8.3 mg/dL — ABNORMAL LOW (ref 8.9–10.3)
Chloride: 99 mmol/L (ref 98–111)
Creatinine, Ser: 0.84 mg/dL (ref 0.61–1.24)
GFR, Estimated: >60 mL/min (ref >60)
Glucose, Bld: 120 mg/dL — ABNORMAL HIGH (ref 70–99)
Potassium: 4 mmol/L (ref 3.5–5.1)
Sodium: 134 mmol/L — ABNORMAL LOW (ref 135–145)
Total Bilirubin: 1.1 mg/dL (ref 0.3–1.2)
Total Protein: 5.2 g/dL — ABNORMAL LOW (ref 6.5–8.1)

## 2023-02-18 LAB — SURGICAL PATHOLOGY

## 2023-02-18 MED ORDER — SODIUM CHLORIDE 0.9 % IV SOLN
125.0000 mg | Freq: Once | INTRAVENOUS | Status: AC
  Administered 2023-02-18: 125 mg via INTRAVENOUS
  Filled 2023-02-18: qty 10

## 2023-02-18 NOTE — Progress Notes (Signed)
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 Active Problems:   Anxiety disorder   Depressed state   Acute cholecystitis   2 Days Post-Op  02/16/2023  POST-OPERATIVE DIAGNOSIS:  LOOP ILEOSTOMY IN PLACE FOR FECAL DIVERSION HISTORY OF SIGMOID DIVERTICULITIS DIVERSION COLITIS (MILD) CHOLECYSTITIS S/P PERCUTANEOUS CHOLECYSTOTOMY TUBE DRAINAGE   PROCEDURE:   OPEN TAKEDOWN OF LOOP ILEOSTOMY FLEXIBLE SIGMOIDOSCOPY LAPAROSCOPIC CHOLECYSTECTOMY WITH INTRAOPERATIVE CHOLANGIOGRAM LYSIS OF ADHESIONS X 45 MIN (40% CASE) TRANSVERSUS ABDOMINIS PLANE (TAP) BLOCK - BILATERAL   SURGEON:  Ardeth Sportsman, MD  OR FINDINGS:    No stricture at rectal anastomosis.  Some exposed staples posterior midline but no evidence of active opening or fistula.  Minimal narrowing.  Anastomosis at 14 cm in anal verge by flexible sigmoidoscopy.  Mild diversion colitis and proctitis.    Anastomosis well healed.    Mild adhesions of loop ileostomy to abdominal wall.  No parastomal hernia. It is a side-to-side stapled ileoileal anastomosis close to the terminal ileum.    Percutaneous cholecystostomy tube between ribs going into a very strong and thickened gallbladder.  Planes for gallbladder and liver obliterated by very thick adhesions.  Ventral dome down done.  Cholangiography confirmed no retained stones or leak.  Gallbladder stapled off at the mid cystic duct using a blue stapler given massive thickening and  inability to place clips or have Endoloop provide adequate closure.  Assessment  Struggling with postoperative nausea.  [facility] Stay = 2 days)  Plan:  -ERAS protocol  -Keeping on clear liquids.  If has bowel movements and less nausea can try and advance to dysphagia 1/fulls.  Hold off on solids until nausea gone.    -Patient still struggling with nausea.  Keep on scheduled Zofran.  He has had issues with Compazine and metoclopramide and refuses to take those.  He did try some lorazepam and I think that helped.  Can help address some of his anxiety and depression issues as well.  Added scopolamine patch since I think there is a vertigo component.  -Pain control.  Ice/heat.  Scheduled Tylenol.  Make Robaxin scheduled.  Give extra dose of narcotics and increase intensity and frequency for now. -Follow labs. -Continue Lexapro for anxiety/depression. -VTE prophylaxis- SCDs, etc -mobilize as tolerated to help recovery  Disposition:  Disposition:  The patient is from: Home  Anticipate discharge to:  Home  Anticipated Date of Discharge is:  April 22,2024   Barriers to discharge:  Pending Clinical improvement (more likely than not)  Patient currently is NOT MEDICALLY STABLE for discharge from the hospital from a surgery standpoint.      I reviewed nursing notes, last 24 h vitals and pain scores, last 48 h intake and output, last 24 h labs and trends, and last 24 h imaging results. I have reviewed this patient's available data, including medical history, events of note, test results, etc as part of my evaluation.  A significant portion of that time was spent in counseling.  Care during the described time interval was provided by me.  This care required  moderate level of medical decision making.  02/18/2023    Subjective: (Chief complaint)  Less pain today.  Still nauseated.  Scopolamine patch and lorazepam have helped a little bit.  Hesitant to get out of bed.  Wife in  room.  No flatus or bowel movement yet.  No emesis since yesterday morning.  Objective:  Vital signs:  Vitals:   02/17/23 0525 02/17/23 1404 02/17/23 2137 02/18/23 0531  BP: 113/77 115/82 102/64 104/62  Pulse: 81 93 77 78  Resp: 20 18 20 17   Temp: 98.2 F (36.8 C) 98.4 F (36.9 C) 98.4 F (36.9 C) 98.3 F (36.8 C)  TempSrc:  Oral    SpO2: 97% 93% 91% 94%  Weight:      Height:           Intake/Output   Yesterday:  04/18 0701 - 04/19 0700 In: 470 [P.O.:360; IV Piggyback:110] Out: 300 [Urine:300] This shift:  Total I/O In: 120 [P.O.:120] Out: 0   Bowel function:  Flatus: No  BM:  No  Drain: (No drain)   Physical Exam:  General: Pt awake/alert in no active distress.  Seems more relaxed this morning   Eyes: PERRL, normal EOM.  Sclera clear.  No icterus Neuro: CN II-XII intact w/o focal sensory/motor deficits. Lymph: No head/neck/groin lymphadenopathy Psych:  No delerium/psychosis/paranoia.  Oriented x 4 HENT: Normocephalic, Mucus membranes moist.  No thrush Neck: Supple, No tracheal deviation.  No obvious thyromegaly Chest: No pain to chest wall compression.  Good respiratory excursion.  No audible wheezing CV:  Pulses intact.  Regular rhythm.  No major extremity edema MS: Normal AROM mjr joints.  No obvious deformity  Abdomen: Soft.  Mildy distended.  Mildly tender at incisions only.  No evidence of peritonitis.  No incarcerated hernias.  Ext:  No deformity.  No mjr edema.  No cyanosis Skin: No petechiae / purpurea.  No major sores.  Warm and dry    Results:   Cultures: No results found for this or any previous visit (from the past 720 hour(s)).  Labs: Results for orders placed or performed during the hospital encounter of 02/16/23 (from the past 48 hour(s))  Lipase, blood     Status: None   Collection Time: 02/17/23  3:52 AM  Result Value Ref Range   Lipase 31 11 - 51 U/L    Comment: Performed at [facility], [address], [place], [place] [address]  Hepatic function panel     Status: Abnormal   Collection Time: 02/17/23  3:52 AM  Result Value Ref Range   Total Protein 5.4 (L) 6.5 - 8.1 g/dL   Albumin 3.2 (L) 3.5 - 5.0 g/dL   AST 604 (H) 15 - 41 U/L   ALT 308 (H) 0 - 44 U/L   Alkaline Phosphatase 117 38 - 126 U/L   Total Bilirubin 1.3 (H) 0.3 - 1.2 mg/dL   Bilirubin, Direct 0.5 (H) 0.0 - 0.2 mg/dL   Indirect Bilirubin 0.8 0.3 - 0.9 mg/dL    Comment: Performed at [facility], [address], [place], [place] [address]  Type and screen [facility]     Status: None   Collection Time: 02/17/23  3:55 AM  Result Value Ref Range   ABO/RH(D) O POS    Antibody Screen NEG    Sample Expiration      02/20/2023,2359 Performed at [facility], [address] [place], [place] [address]   Basic metabolic  panel     Status: Abnormal   Collection Time: 02/17/23  3:55 AM  Result Value Ref Range   Sodium 135 135 - 145 mmol/L   Potassium 4.8 3.5 - 5.1 mmol/L   Chloride 102 98 - 111 mmol/L   CO2 24 22 - 32 mmol/L   Glucose, Bld 198 (H) 70 - 99 mg/dL    Comment: Glucose reference range applies only to samples taken after fasting for at least 8 hours.   BUN 16 6 - 20 mg/dL   Creatinine, Ser 6.96 0.61 - 1.24 mg/dL   Calcium 8.4 (L) 8.9 - 10.3 mg/dL   GFR, Estimated >29 >52 mL/min    Comment: (NOTE) Calculated using the CKD-EPI Creatinine Equation (2021)    Anion gap 9 5 - 15    Comment: Performed at [facility], [address], [place], [place] [address]  CBC     Status: Abnormal   Collection Time: 02/17/23  3:55 AM  Result Value Ref Range   WBC 15.9 (H) 4.0 - 10.5 K/uL   RBC 3.97 (L) 4.22 - 5.81 MIL/uL   Hemoglobin 10.7 (L) 13.0 - 17.0 g/dL   HCT 44.0 (L) 10.2 - 72.5 %   MCV 87.2 80.0 - 100.0 fL   MCH 27.0 26.0 - 34.0 pg   MCHC 30.9 30.0 - 36.0 g/dL   RDW 36.6 44.0 - 34.7 %   Platelets 292 150 - 400 K/uL   nRBC 0.0 0.0 - 0.2 %     Comment: Performed at [facility], [address] [place], [place] [address]  Magnesium     Status: None   Collection Time: 02/17/23  3:55 AM  Result Value Ref Range   Magnesium 1.8 1.7 - 2.4 mg/dL    Comment: Performed at [facility], [address], [place], [place] [address]  CBC     Status: Abnormal   Collection Time: 02/18/23  4:36 AM  Result Value Ref Range   WBC 11.3 (H) 4.0 - 10.5 K/uL   RBC 3.06 (L) 4.22 - 5.81 MIL/uL   Hemoglobin 8.3 (L) 13.0 - 17.0 g/dL   HCT 64.3 (L) 32.9 - 51.8 %   MCV 87.6 80.0 - 100.0 fL   MCH 27.1 26.0 - 34.0 pg   MCHC 31.0 30.0 - 36.0 g/dL   RDW 84.1 66.0 - 63.0 %   Platelets 210 150 - 400 K/uL   nRBC 0.0 0.0 - 0.2 %    Comment: Performed at [facility], [address], [place], [place] [address]  Comprehensive metabolic panel     Status: Abnormal   Collection Time: 02/18/23  4:36 AM  Result Value Ref Range   Sodium 134 (L) 135 - 145 mmol/L   Potassium 4.0 3.5 - 5.1 mmol/L   Chloride 99 98 - 111 mmol/L   CO2 29 22 - 32 mmol/L   Glucose, Bld 120 (H) 70 - 99 mg/dL    Comment: Glucose reference range applies only to samples taken after fasting for at least 8 hours.   BUN 17 6 - 20 mg/dL   Creatinine, Ser 9.32 0.61 - 1.24 mg/dL   Calcium 8.3 (L) 8.9 - 10.3 mg/dL   Total Protein 5.2 (L) 6.5 - 8.1 g/dL   Albumin 3.1 (L) 3.5 - 5.0 g/dL   AST 80 (H) 15 - 41 U/L   ALT 240 (H) 0 - 44 U/L   Alkaline Phosphatase 96 38 - 126 U/L  Total Bilirubin 1.1 0.3 - 1.2 mg/dL   GFR, Estimated >16 >10 mL/min    Comment: (NOTE) Calculated using the CKD-EPI Creatinine Equation (2021)    Anion gap 6 5 - 15    Comment: Performed at [facility], [address] [place], [place] [address]    Imaging / Studies: DG Cholangiogram Operative  Result Date: 02/16/2023 CLINICAL DATA:  Cholecystectomy EXAM: INTRAOPERATIVE CHOLANGIOGRAM TECHNIQUE: Cholangiographic images from the C-arm  fluoroscopic device were submitted for interpretation post-operatively. Please see the procedural report for the amount of contrast and the fluoroscopy time utilized. FLUOROSCOPY: Radiation Exposure Index (as provided by the fluoroscopic device): 9.42 mGy Kerma COMPARISON:  12/15/2022 FINDINGS: Intraoperative cholangiogram performed during laparoscopic procedure. The residual cystic duct, biliary confluence, common hepatic duct, and common bile duct are all patent. Contrast easily drains into the duodenum. No dilatation, obstruction, stricture, or large filling defect. IMPRESSION: Patent biliary system. Electronically Signed   By: Judie Petit.  Shick M.D.   On: 02/16/2023 15:25    Medications / Allergies: per chart  Antibiotics: Anti-infectives (From admission, onward)    Start     Dose/Rate Route Frequency Ordered Stop   02/16/23 1030  ertapenem (INVANZ) 1,000 mg in sodium chloride 0.9 % 100 mL IVPB        1 g 200 mL/hr over 30 Minutes Intravenous On call to O.R. 02/16/23 1022 02/16/23 1202         Note: Portions of this report may have been transcribed using voice recognition software. Every effort was made to ensure accuracy; however, inadvertent computerized transcription errors may be present.   Any transcriptional errors that result from this process are unintentional.    Ardeth Sportsman, MD, FACS, MASCRS Esophageal, Gastrointestinal & Colorectal Surgery Robotic and Minimally Invasive Surgery  [facility] [address], [address] [place], [place] [address] [phone] Fax [phone] Main  CONTACT INFORMATION:  Weekday (9AM-5PM): Call [facility] main office at [phone]  Weeknight (5PM-9AM) or Weekend/Holiday: Check [email or web address] (password " [email or web address]") for General Surgery [facility] coverage  (Please, do not use SecureChat as it is not reliable communication to reach operating surgeons for immediate patient care given surgeries/outpatient  duties/clinic/cross-coverage/off post-call which would lead to a delay in care.  Epic staff messaging available for outptient concerns, but may not be answered for 48 hours or more).     02/18/2023  6:34 AM

## 2023-02-19 LAB — HEPATIC FUNCTION PANEL
ALT: 181 U/L — ABNORMAL HIGH (ref 0–44)
AST: 56 U/L — ABNORMAL HIGH (ref 15–41)
Albumin: 2.9 g/dL — ABNORMAL LOW (ref 3.5–5.0)
Alkaline Phosphatase: 91 U/L (ref 38–126)
Bilirubin, Direct: 0.4 mg/dL — ABNORMAL HIGH (ref 0.0–0.2)
Indirect Bilirubin: 0.8 mg/dL (ref 0.3–0.9)
Total Bilirubin: 1.2 mg/dL (ref 0.3–1.2)
Total Protein: 5.2 g/dL — ABNORMAL LOW (ref 6.5–8.1)

## 2023-02-19 LAB — POTASSIUM: Potassium: 3.7 mmol/L (ref 3.5–5.1)

## 2023-02-19 LAB — CBC
HCT: 24.6 % — ABNORMAL LOW (ref 39.0–52.0)
Hemoglobin: 7.6 g/dL — ABNORMAL LOW (ref 13.0–17.0)
MCH: 27 pg (ref 26.0–34.0)
MCHC: 30.9 g/dL (ref 30.0–36.0)
MCV: 87.2 fL (ref 80.0–100.0)
Platelets: 194 10*3/uL (ref 150–400)
RBC: 2.82 MIL/uL — ABNORMAL LOW (ref 4.22–5.81)
RDW: 14.8 % (ref 11.5–15.5)
WBC: 8.4 10*3/uL (ref 4.0–10.5)
nRBC: 0 % (ref 0.0–0.2)

## 2023-02-19 LAB — CREATININE, SERUM
Creatinine, Ser: 0.76 mg/dL (ref 0.61–1.24)
GFR, Estimated: >60 mL/min (ref >60)

## 2023-02-19 NOTE — Plan of Care (Signed)
  Problem: Education: Goal: Understanding of discharge needs will improve Outcome: Progressing   Problem: Activity: Goal: Ability to tolerate increased activity will improve Outcome: Progressing   Problem: Bowel/Gastric: Goal: Gastrointestinal status for postoperative course will improve Outcome: Progressing   Problem: Health Behavior/Discharge Planning: Goal: Identification of community resources to assist with postoperative recovery needs will improve Outcome: Progressing   Problem: Nutritional: Goal: Will attain and maintain optimal nutritional status will improve Outcome: Progressing   Problem: Clinical Measurements: Goal: Postoperative complications will be avoided or minimized Outcome: Progressing   Problem: Respiratory: Goal: Respiratory status will improve Outcome: Progressing

## 2023-02-19 NOTE — Progress Notes (Signed)
Pt tolerated breakfast and lunch well, no nausea or vomiting, advanced to soft diet.

## 2023-02-19 NOTE — Progress Notes (Signed)
3 Days Post-Op   Subjective/Chief Complaint: Patient feels a little better.  Nausea resolving.  Pain improved.   Objective: Vital signs in last 24 hours: Temp:  [98.6 F (37 C)-98.9 F (37.2 C)] 98.6 F (37 C) (04/20 0400) Pulse Rate:  [71-74] 72 (04/20 0400) Resp:  [20] 20 (04/20 0400) BP: (97-100)/(61-65) 99/65 (04/20 0400) SpO2:  [95 %-96 %] 95 % (04/20 0400) Weight:  [86.2 kg] 86.2 kg (04/20 0435) Last BM Date : 02/18/23  Intake/Output from previous day: 04/19 0701 - 04/20 0700 In: 310 [P.O.:90; IV Piggyback:220] Out: 1 [Stool:1] Intake/Output this shift: Total I/O In: 3 [I.V.:3] Out: -   General appearance: alert and cooperative Resp: clear to auscultation bilaterally Cardio: regular rate and rhythm GI: Soft, mild tenderness.  Incisions look good  Lab Results:  Recent Labs    02/18/23 0436 02/19/23 0545  WBC 11.3* 8.4  HGB 8.3* 7.6*  HCT 26.8* 24.6*  PLT 210 194   BMET Recent Labs    02/17/23 0355 02/18/23 0436  NA 135 134*  K 4.8 4.0  CL 102 99  CO2 24 29  GLUCOSE 198* 120*  BUN 16 17  CREATININE 0.93 0.84  CALCIUM 8.4* 8.3*   PT/INR No results for input(s): "LABPROT", "INR" in the last 72 hours. ABG No results for input(s): "PHART", "HCO3" in the last 72 hours.  Invalid input(s): "PCO2", "PO2"  Studies/Results: No results found.  Anti-infectives: Anti-infectives (From admission, onward)    Start     Dose/Rate Route Frequency Ordered Stop   02/16/23 1030  ertapenem (INVANZ) 1,000 mg in sodium chloride 0.9 % 100 mL IVPB        1 g 200 mL/hr over 30 Minutes Intravenous On call to O.R. 02/16/23 1022 02/16/23 1202       Assessment/Plan: s/p Procedure(s): OPEN TAKEDOWN OF LOOP ILEOSTOMY, BILATERAL TAP BLOCK (N/A) FLEXIBLE SIGMOIDOSCOPY (N/A) ANORECTAL EXAM UNDER ANESTHESIA (N/A) LAPAROSCOPIC CHOLECYSTECTOMY SINGLE SITE WITH INTRAOPERATIVE CHOLANGIOGRAM, LYSIS OF ADHESIONS (N/A) Advance diet -ERAS protocol -Scheduled Zofran for 24  hours.  Hopefully his postop nausea will abate -Pain control.  Ice/heat.  Scheduled Tylenol.  Make Robaxin scheduled.  Give extra dose of narcotics and increase intensity and frequency for now. -Follow labs. -Continue Lexapro for anxiety/depression. -VTE prophylaxis- SCDs, etc -mobilize as tolerated to help recovery   Disposition:  Disposition:  The patient is from: Home   Anticipate discharge to:  Home   Anticipated Date of Discharge is:  April 21,2024  LOS: 3 days    Carlos Stone 02/19/2023

## 2023-02-20 LAB — CBC
HCT: 27.9 % — ABNORMAL LOW (ref 39.0–52.0)
Hemoglobin: 8.7 g/dL — ABNORMAL LOW (ref 13.0–17.0)
MCH: 27 pg (ref 26.0–34.0)
MCHC: 31.2 g/dL (ref 30.0–36.0)
MCV: 86.6 fL (ref 80.0–100.0)
Platelets: 238 10*3/uL (ref 150–400)
RBC: 3.22 MIL/uL — ABNORMAL LOW (ref 4.22–5.81)
RDW: 14.5 % (ref 11.5–15.5)
WBC: 8.4 10*3/uL (ref 4.0–10.5)
nRBC: 0 % (ref 0.0–0.2)

## 2023-02-20 LAB — CREATININE, SERUM
Creatinine, Ser: 0.75 mg/dL (ref 0.61–1.24)
GFR, Estimated: >60 mL/min (ref >60)

## 2023-02-20 LAB — POTASSIUM: Potassium: 3.7 mmol/L (ref 3.5–5.1)

## 2023-02-20 MED ORDER — TRAMADOL HCL 50 MG PO TABS: 50.0000 mg | ORAL_TABLET | Freq: Four times a day (QID) | ORAL | 0 refills | Status: AC | PRN

## 2023-02-20 NOTE — Progress Notes (Signed)
4 Days Post-Op   Subjective/Chief Complaint: Feels great. Having bm's. Tolerating diet   Objective: Vital signs in last 24 hours: Temp:  [98 F (36.7 C)-98.6 F (37 C)] 98 F (36.7 C) (04/21 0506) Pulse Rate:  [62-69] 62 (04/21 0506) Resp:  [15-20] 20 (04/21 0506) BP: (101-115)/(63-70) 101/69 (04/21 0506) SpO2:  [94 %-96 %] 96 % (04/21 0506) Weight:  [85.7 kg] 85.7 kg (04/21 0506) Last BM Date : 02/19/23  Intake/Output from previous day: 04/20 0701 - 04/21 0700 In: 843 [P.O.:510; I.V.:3; IV Piggyback:330] Out: 0  Intake/Output this shift: No intake/output data recorded.  General appearance: alert and cooperative Resp: clear to auscultation bilaterally Cardio: regular rate and rhythm GI: soft, minimal tenderness  Lab Results:  Recent Labs    02/18/23 0436 02/19/23 0545  WBC 11.3* 8.4  HGB 8.3* 7.6*  HCT 26.8* 24.6*  PLT 210 194   BMET Recent Labs    02/18/23 0436 02/19/23 0545  NA 134*  --   K 4.0 3.7  CL 99  --   CO2 29  --   GLUCOSE 120*  --   BUN 17  --   CREATININE 0.84 0.76  CALCIUM 8.3*  --    PT/INR No results for input(s): "LABPROT", "INR" in the last 72 hours. ABG No results for input(s): "PHART", "HCO3" in the last 72 hours.  Invalid input(s): "PCO2", "PO2"  Studies/Results: No results found.  Anti-infectives: Anti-infectives (From admission, onward)    Start     Dose/Rate Route Frequency Ordered Stop   02/16/23 1030  ertapenem (INVANZ) 1,000 mg in sodium chloride 0.9 % 100 mL IVPB        1 g 200 mL/hr over 30 Minutes Intravenous On call to O.R. 02/16/23 1022 02/16/23 1202       Assessment/Plan: s/p Procedure(s): OPEN TAKEDOWN OF LOOP ILEOSTOMY, BILATERAL TAP BLOCK (N/A) FLEXIBLE SIGMOIDOSCOPY (N/A) ANORECTAL EXAM UNDER ANESTHESIA (N/A) LAPAROSCOPIC CHOLECYSTECTOMY SINGLE SITE WITH INTRAOPERATIVE CHOLANGIOGRAM, LYSIS OF ADHESIONS (N/A) Advance diet Discharge   LOS: 4 days    Chevis Pretty III 02/20/2023

## 2023-02-23 NOTE — Discharge Summary (Signed)
Patient ID: Carlos Stone [id number] February 07, 1976 47 y.o.  Admit date: 02/16/2023 Discharge date: 02/23/2023   Discharge Diagnosis S/p OPEN TAKEDOWN OF LOOP ILEOSTOMY FLEXIBLE SIGMOIDOSCOPY LAPAROSCOPIC CHOLECYSTECTOMY WITH INTRAOPERATIVE CHOLANGIOGRAM LYSIS OF ADHESIONS X 45 MIN (40% CASE) TRANSVERSUS ABDOMINIS PLANE (TAP) BLOCK - BILATERAL  Consultants None  H&P Carlos Stone  has presented today for surgery, with the diagnosis of LOOP ILEOSTOMY IN PLACE FOR FECAL DIVERSION.  The various methods of treatment have been discussed with the patient and family. After consideration of risks, benefits and other options for treatment, the patient has consented to  Procedure(s): OPEN TAKEDOWN OF LOOP ILEOSTOMY (N/A) FLEXIBLE SIGMOIDOSCOPY (N/A) ANORECTAL EXAM UNDER ANESTHESIA (N/A) LAPAROSCOPIC CHOLECYSTECTOMY SINGLE SITE WITH INTRAOPERATIVE CHOLANGIOGRAM (N/A) as a surgical intervention.  The patient's history has been reviewed, patient examined, no change in status, stable for surgery.  I have reviewed the patient's chart and labs.  Questions were answered to the patient's satisfaction.     I have re-reviewed the the patient's records, history, medications, and allergies.  I have re-examined the patient.  I again discussed intraoperative plans and goals of post-operative recovery.  The patient agrees to proceed.  Procedures Dr. Michaell Cowing - 02/16/23  OPEN TAKEDOWN OF LOOP ILEOSTOMY FLEXIBLE SIGMOIDOSCOPY LAPAROSCOPIC CHOLECYSTECTOMY WITH INTRAOPERATIVE CHOLANGIOGRAM LYSIS OF ADHESIONS X 45 MIN (40% CASE) TRANSVERSUS ABDOMINIS PLANE (TAP) BLOCK - BILATERAL  Hospital Course:  Patient underwent above operation. Tolerated the procedure well. Admitted post op. Ileus post op that resolved and diet was advanced and tolerated. Patient discharged POD 4. Please see MD's progress note for more information.   I was not directly involved in this patient's care and did not see the patient during  their hospital stay, therefore the information in this discharge summary was taken entirely from the chart.   Allergies as of 02/20/2023       Reactions   Compazine [prochlorperazine Edisylate] Other (See Comments)   Hallucinations   Amoxil [amoxicillin] Rash   Tolerated cefepime fine   Cipro [ciprofloxacin Hcl] Rash   Penicillins Rash        Medication List     STOP taking these medications    cefdinir 300 MG capsule Commonly known as: OMNICEF   ferrous sulfate 325 (65 FE) MG tablet   ondansetron 4 MG disintegrating tablet Commonly known as: ZOFRAN-ODT       TAKE these medications    BENEFIBER PO Take 2 Scoops by mouth daily.   escitalopram 20 MG tablet Commonly known as: LEXAPRO Take 20 mg by mouth daily.   IRON PO Take 1 tablet by mouth daily.   loperamide 2 MG tablet Commonly known as: IMODIUM A-D Take 2 mg by mouth at bedtime as needed for diarrhea or loose stools.   traMADol 50 MG tablet Commonly known as: ULTRAM Take 1-2 tablets (50-100 mg total) by mouth every 6 (six) hours as needed for moderate pain.               Discharge Care Instructions  (From admission, onward)           Start     Ordered   02/16/23 0000  Discharge wound care:       Comments: It is good for closed incisions and even open wounds to be washed every day.  Shower every day.  Short baths are fine.  Wash the incisions and wounds clean with soap & water.    You may leave closed incisions open to air if it  is dry.   You may cover the incision with clean gauze & replace it after your daily shower for comfort.  TEGADERM:  You have clear gauze band-aid dressings over your closed incision(s).  Remove the dressings & shoelace wicks 3 days after surgery = 4/20 Saturday   02/16/23 1143              Follow-up Information     Karie Soda, MD Follow up on 03/09/2023.   Specialties: General Surgery, Colon and Rectal Surgery Contact information: [address] [place] [address] [phone]                 Signed: Leary Roca, [facility] 02/23/2023, 3:02 PM Please see Amion for pager number during day hours 7:00am-4:30pm

## 2023-04-19 IMAGING — DX DG CHEST 1V PORT
1 series · 1 of 1 positions shown · non-contrast
Comparison: 06/20/2014.

CLINICAL DATA: Pt presented [place] ED via [place]REDACTED[facility] from [facility] with a
syncope episode earlier. Pt unsure if he hit his head but said he
feels like he did. No previous surgery to chest. No pending Covid
test.

EXAM:
PORTABLE CHEST 1 VIEW

[chest ap]
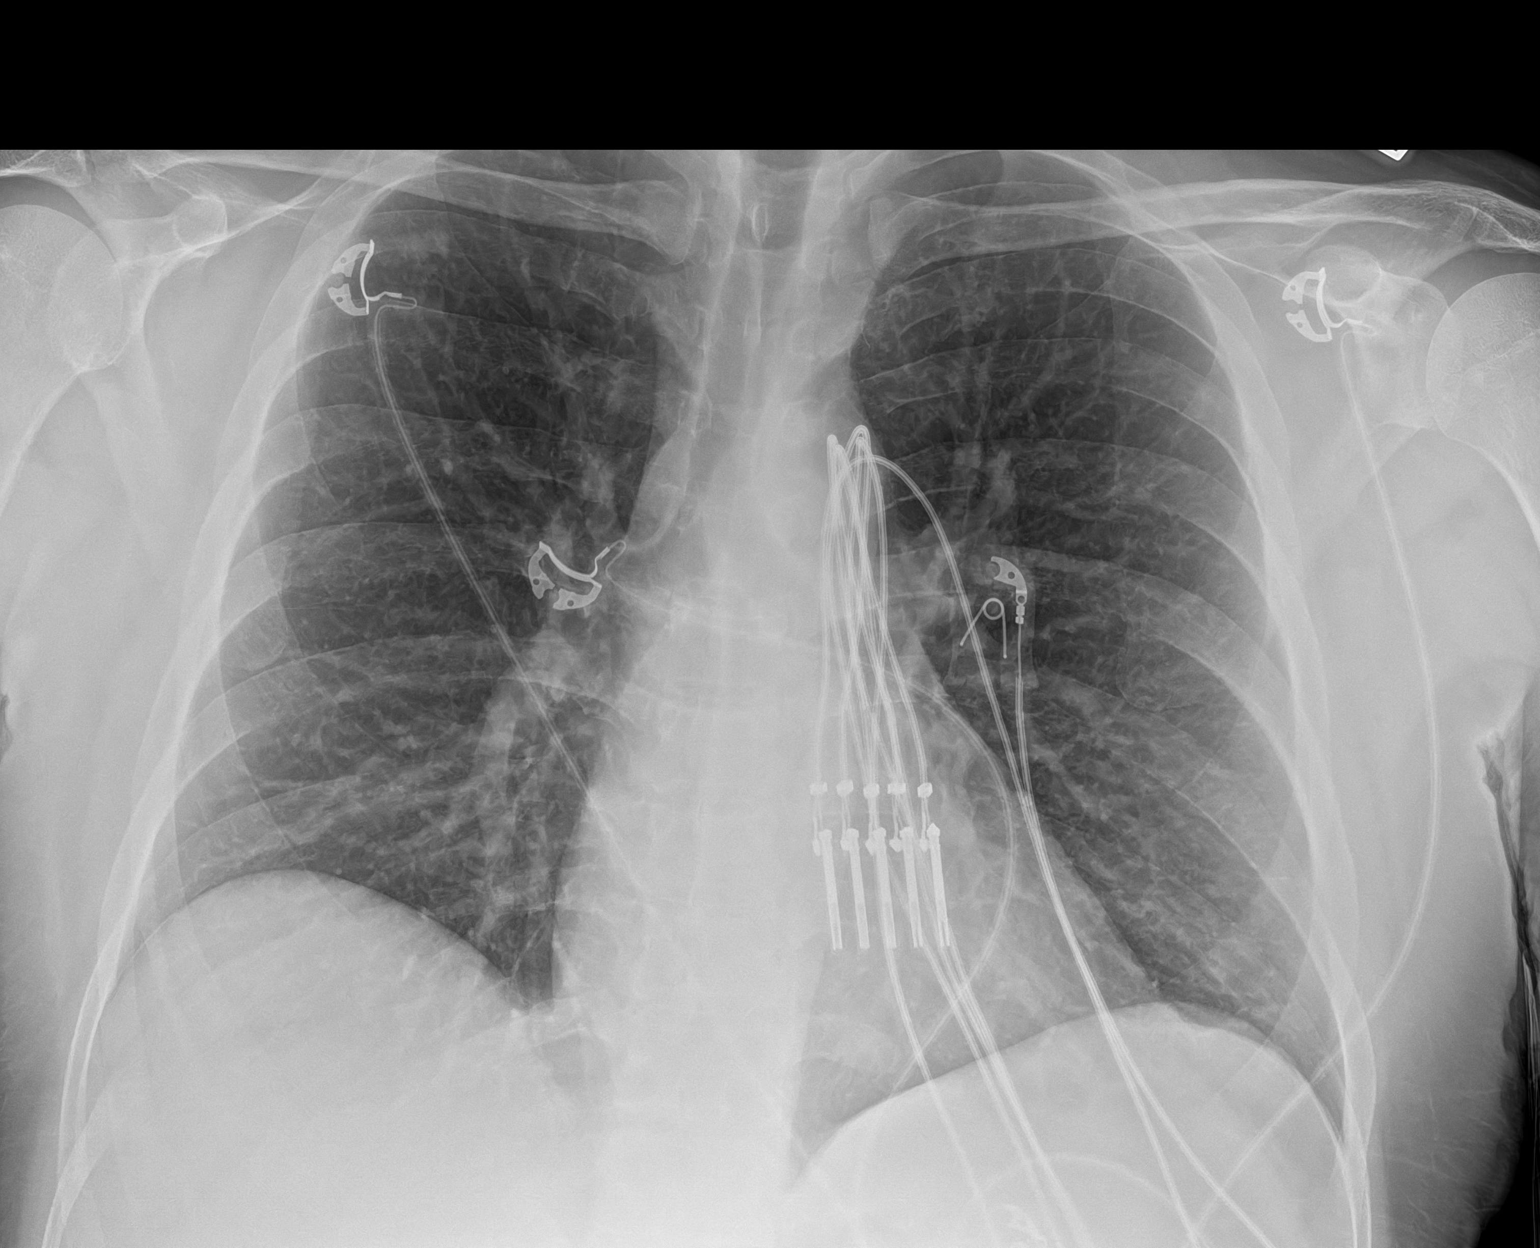

[1 of 1 positions shown; findings below may reference images not displayed]

FINDINGS: Normal heart, mediastinum and hila.

Clear lungs.  No pleural effusion or pneumothorax.

Skeletal structures are grossly intact.
IMPRESSION: No active disease.
